# Patient Record
Sex: Male | Born: 1960 | Race: Black or African American | Hispanic: No | Smoking: Never smoker
Health system: Southern US, Community
[De-identification: ages and names within clinical notes are randomized; demographics above are authoritative.]

## PROBLEM LIST (undated history)

## (undated) ENCOUNTER — Emergency Department (HOSPITAL_COMMUNITY): Discharge status: Absent without leave | Payer: BLUE CROSS/BLUE SHIELD

## (undated) DIAGNOSIS — C61 Malignant neoplasm of prostate: Secondary | ICD-10-CM

## (undated) DIAGNOSIS — I10 Essential (primary) hypertension: Secondary | ICD-10-CM

## (undated) DIAGNOSIS — K219 Gastro-esophageal reflux disease without esophagitis: Secondary | ICD-10-CM

## (undated) DIAGNOSIS — Z8719 Personal history of other diseases of the digestive system: Secondary | ICD-10-CM

## (undated) DIAGNOSIS — C801 Malignant (primary) neoplasm, unspecified: Secondary | ICD-10-CM

## (undated) DIAGNOSIS — G4730 Sleep apnea, unspecified: Secondary | ICD-10-CM

## (undated) DIAGNOSIS — J45909 Unspecified asthma, uncomplicated: Secondary | ICD-10-CM

## (undated) DIAGNOSIS — R188 Other ascites: Secondary | ICD-10-CM

## (undated) HISTORY — PX: OTHER SURGICAL HISTORY: SHX169

## (undated) HISTORY — PX: HERNIA REPAIR: SHX51

## (undated) HISTORY — DX: Malignant neoplasm of prostate: C61

## (undated) HISTORY — PX: VASECTOMY: SHX75

## (undated) HISTORY — PX: KNEE SURGERY: SHX244

---

## 1998-04-09 ENCOUNTER — Encounter: Admission: RE | Admit: 1998-04-09 | Discharge: 1998-04-09 | Payer: Self-pay | Admitting: *Deleted

## 1999-01-23 ENCOUNTER — Emergency Department (HOSPITAL_COMMUNITY): Admission: EM | Admit: 1999-01-23 | Discharge: 1999-01-23 | Payer: Self-pay | Admitting: Emergency Medicine

## 2000-01-10 ENCOUNTER — Emergency Department (HOSPITAL_COMMUNITY): Admission: EM | Admit: 2000-01-10 | Discharge: 2000-01-10 | Payer: Self-pay | Admitting: *Deleted

## 2002-05-16 ENCOUNTER — Encounter (INDEPENDENT_AMBULATORY_CARE_PROVIDER_SITE_OTHER): Payer: Self-pay | Admitting: *Deleted

## 2002-05-16 ENCOUNTER — Ambulatory Visit (HOSPITAL_BASED_OUTPATIENT_CLINIC_OR_DEPARTMENT_OTHER): Admission: RE | Admit: 2002-05-16 | Discharge: 2002-05-16 | Payer: Self-pay | Admitting: Urology

## 2007-02-23 ENCOUNTER — Emergency Department (HOSPITAL_COMMUNITY): Admission: EM | Admit: 2007-02-23 | Discharge: 2007-02-23 | Payer: Self-pay | Admitting: Family Medicine

## 2007-09-03 ENCOUNTER — Emergency Department (HOSPITAL_COMMUNITY): Admission: EM | Admit: 2007-09-03 | Discharge: 2007-09-03 | Payer: Self-pay | Admitting: Family Medicine

## 2007-09-20 ENCOUNTER — Encounter: Admission: RE | Admit: 2007-09-20 | Discharge: 2007-09-20 | Payer: Self-pay | Admitting: Internal Medicine

## 2007-12-16 ENCOUNTER — Encounter (INDEPENDENT_AMBULATORY_CARE_PROVIDER_SITE_OTHER): Payer: Self-pay | Admitting: Urology

## 2007-12-17 ENCOUNTER — Ambulatory Visit (HOSPITAL_BASED_OUTPATIENT_CLINIC_OR_DEPARTMENT_OTHER): Admission: RE | Admit: 2007-12-17 | Discharge: 2007-12-17 | Payer: Self-pay | Admitting: Urology

## 2010-01-27 ENCOUNTER — Emergency Department (HOSPITAL_COMMUNITY): Admission: EM | Admit: 2010-01-27 | Discharge: 2010-01-27 | Payer: Self-pay | Admitting: Family Medicine

## 2010-09-28 ENCOUNTER — Emergency Department: Payer: Self-pay | Admitting: Emergency Medicine

## 2010-12-31 LAB — D-DIMER, QUANTITATIVE: D-Dimer, Quant: 0.23 ug/mL-FEU (ref 0.00–0.48)

## 2011-02-25 NOTE — Op Note (Signed)
Kenneth Wiggins, Kenneth Wiggins               ACCOUNT NO.:  [id number]   MEDICAL RECORD NO.:  [id number]          PATIENT TYPE:  AMB   LOCATION:  [facility]                         FACILITY:  [facility]   PHYSICIAN:  Hanley Ben, M.D.  DATE OF BIRTH:  January 06, 1961   DATE OF PROCEDURE:  12/17/2007  DATE OF DISCHARGE:                               OPERATIVE REPORT   PREOPERATIVE DIAGNOSIS:  Left paratesticular mass.   POSTOPERATIVE DIAGNOSIS:  Left paratesticular mass.   PROCEDURE:  Left scrotal exploration with excision of left  paratesticular mass.   ANESTHESIA:  General/LMA.   INDICATIONS FOR PROCEDURE:  \  Mr. Gerena is a 50 year old African-American male who is status post  vasectomy.  He reported to Dr. Sammie Bench clinic complaining of left scrotal  swelling that was nonpainful without any prior inciting event.  Workup  demonstrated paratesticular mass suggestive of a benign process, and  likewise, he was brought to the operating theater for further  evaluation.   PROCEDURE IN DETAIL:  The patient was brought to the operating room and  placed in a supine position.  He was correctly identified by his wrist  band.  An appropriate time out was taken.  General/LMA anesthesia was  delivered.  Once adequately anesthetized, his groin was clipped, prepped  and draped sterilely.  We began our procedure by performing  comprehensive scrotal examination under anesthesia.  The right  hemiscrotum demonstrated a normal right testicle and epididymis.  Cord  structures were palpably normal too.  There was a palpable defect in the  vas deferens with vasectomy.  Palpation of the left hemiscrotum  demonstrated a large tennis ball-sized mass that was fluctuant but also  had some firm components to it.  It was poorly identifiable on palpation  alone.  It was palpably extratesticular.  All the cord structures  appeared normal on the left.  Subsequently, we made a vertical incision  in the anterior aspect of the left  hemiscrotum sharply and then  dissected through the dartos fascia using a combination of blunt and  sharp dissection as well as electrocautery taking great care to address  all bleeding sites.  Once we reached the appropriate plane on top of the  mass, we then were able to develop this plane both sharply  and bluntly  and subsequently deliver the mass and the left testicle out of the  incision.  Thorough inspection again revealed a normal-appearing  testicle and epididymis and palpably normal cord structures.  The mass  was quite irregular, very lumpy/bumpy with firm and fluid components to  it.  It was closely adherent to the posterolateral aspect of the  testicle and epididymis.  We then made a small incision into the mass  itself and amber-colored fluid drained freely.  There was no blood noted  here.  Now with the mass decompressed, we then circumferentially  resected the entire rind of the mass taking great care to protect the  epididymis, the testicle and all cord structures.  We did send a section  for analysis to pathology.  Once the entire mass had been resected, we  then  meticulously inspected the entire operative field and addressed all  bleeding areas with electrocautery.  Once we had achieved hemostasis, we  copiously irrigated the field with sterile normal saline.  We then  placed a Penrose drain by making a small incision in the most dependent  portion of the scrotum and developing this space with hemostats and then  pulling the Penrose drain from inside to outside.  The drain was placed  behind the scrotum.  It was sutured into place with one simple  interrupted suture using 3-0 chromic.  We then reinspected the left  hemiscrotum again.  There was no obvious bleeding, and we returned the  testicle into the left hemiscrotum and closed our incision.  The closure  was done in a two-layer fashion.  Deep closure reapproximated the dartos  fascia with deep wide bites and locking  running suture using 3-0  chromic.  We then closed the scrotal skin again with wide bites of  locking running suture using 3-0 chromic.  The incision was hemostatic.  His groin was cleansed and dried and dressed with an athletic supporter  as well as a fluff dressing.  This marked the end of our procedure.  Anesthesia was reversed.  His LMA was removed, and he was taken to the  PACU in stable condition.  There were no complications.  Blood loss was  minimal.  Dr. Janice Norrie was present and participated in all aspects of the  case.     ______________________________  Gaye Alken, M.D.  Electronically Signed    DW/MEDQ  D:  12/17/2007  T:  12/17/2007  Job:  [id number]

## 2011-02-28 NOTE — Op Note (Signed)
   Kenneth Wiggins, Kenneth Wiggins NO.:  [id number]   MEDICAL RECORD NO.:  [id number]                    PATIENT TYPE:   LOCATION:                                       FACILITY:   PHYSICIAN:  Einar Crow, M.D.          DATE OF BIRTH:  [birth year]   DATE OF PROCEDURE:  DATE OF DISCHARGE:                                 OPERATIVE REPORT   LOCAL MEDICAL DOCTOR:   UROLOGIST:  Einar Crow, M.D.   PREOPERATIVE DIAGNOSIS:  Undesired fertility with difficult to palpate vas.  Body habitus 5 feet 10 inches tall, 270 pounds.  Physical exam in the office  showed that it was uncomfortable for both surgeon and patient to bring the  vas to the skin.  Elected to do procedure under brief general anesthesia.   POSTOPERATIVE DIAGNOSIS:  Undesired fertility with difficult to palpate vas.  Body habitus 5 feet 10 inches tall, 270 pounds.  Physical exam in the office  showed that it was uncomfortable for both surgeon and patient to bring the  vas to the skin.  Elected to do procedure under brief general anesthesia.   DRAINS:  None.   COMPLICATIONS:  None.   DESCRIPTION OF PROCEDURE:  The patient was prepped and draped in the dorsal  lithotomy position after institution of an adequate level of general  anesthesia (LMA).  Right vas was brought to the skin, transverse incision  made in the skin, cautery used to go through the skin and dartos.  Vas was  easily visualized, cleaned with needle-tip forceps, grasped with a ring  forcep, brought through the skin, divided proximally and distally.  Proximal  end was sewn back on itself with a 3-0 chromic stitch.  The distal end was  cauterized by placing the lumen of the needle to Bovie down the vas and  cauterizing the lumen.  Similar technique was used on the left side.  With  patient under anesthesia, I was able to bring the vas to the skin.  Identical technique was used on both sides.  Wound was covered with dry  gauze and an ice bag, and patient was returned to recovery in satisfactory  condition.                                                  Einar Crow, M.D.    [facility]/MEDQ  D:  05/16/2002  T:  05/19/2002  Job:  [id number]

## 2011-07-07 LAB — POCT HEMOGLOBIN-HEMACUE
Hemoglobin: 17.4 — ABNORMAL HIGH
Operator id: 268271

## 2011-09-22 ENCOUNTER — Ambulatory Visit: Payer: Self-pay

## 2012-08-09 DIAGNOSIS — C61 Malignant neoplasm of prostate: Secondary | ICD-10-CM

## 2012-08-09 HISTORY — PX: PROSTATE BIOPSY: SHX241

## 2012-08-09 HISTORY — DX: Malignant neoplasm of prostate: C61

## 2012-09-28 ENCOUNTER — Other Ambulatory Visit: Payer: Self-pay | Admitting: Urology

## 2012-11-15 ENCOUNTER — Encounter (HOSPITAL_COMMUNITY): Payer: Self-pay | Admitting: Pharmacy Technician

## 2012-11-22 ENCOUNTER — Encounter (HOSPITAL_COMMUNITY): Payer: Self-pay

## 2012-11-22 ENCOUNTER — Encounter (HOSPITAL_COMMUNITY)
Admission: RE | Admit: 2012-11-22 | Discharge: 2012-11-22 | Disposition: A | Discharge status: Home / Self Care | Payer: BC Managed Care – PPO | Source: Ambulatory Visit | Attending: Urology | Admitting: Urology

## 2012-11-22 HISTORY — DX: Gastro-esophageal reflux disease without esophagitis: K21.9

## 2012-11-22 HISTORY — DX: Personal history of other diseases of the digestive system: Z87.19

## 2012-11-22 HISTORY — DX: Malignant (primary) neoplasm, unspecified: C80.1

## 2012-11-22 HISTORY — DX: Unspecified asthma, uncomplicated: J45.909

## 2012-11-22 HISTORY — DX: Sleep apnea, unspecified: G47.30

## 2012-11-22 LAB — CBC
MCH: 29.8 pg (ref 26.0–34.0)
MCV: 83.7 fL (ref 78.0–100.0)
Platelets: 237 10*3/uL (ref 150–400)
RBC: 5.34 MIL/uL (ref 4.22–5.81)
WBC: 4.5 10*3/uL (ref 4.0–10.5)

## 2012-11-22 LAB — ABO/RH: ABO/RH(D): O POS

## 2012-11-22 LAB — SURGICAL PCR SCREEN: MRSA, PCR: NEGATIVE

## 2012-11-22 NOTE — Patient Instructions (Addendum)
20 Kenneth Wiggins  11/22/2012   Your procedure is scheduled on: 11/24/12  Report to [place] at 0600 AM.  Call this number if you have problems the morning of surgery [phone]: [phone]   Remember: please bring CPAP mask and tubing    Do not eat food or drink liquids After Midnight.      Do not wear jewelry, make-up or nail polish.  Do not wear lotions, powders, or perfumes. You may wear deodorant.  Do not shave 48 hours prior to surgery. Men may shave face and neck.  Do not bring valuables to the hospital.  Contacts, dentures or bridgework may not be worn into surgery.  Leave suitcase in the car. After surgery it may be brought to your room.  For patients admitted to the hospital, checkout time is 11:00 AM the day of discharge.      Please read over the following fact sheets that you were given: MRSA Information.  Paulette Blanch, RN  pre op nurse call if needed [phone]    FAILURE TO FOLLOW THESE INSTRUCTIONS MAY RESULT IN CANCELLATION OF YOUR SURGERY   Patient Signature: ___________________________________________

## 2012-11-23 NOTE — H&P (Signed)
Reason For Visit Presents this morning for robotic-assisted laparoscopic radical prostatectomy.   History of Present Illness  Kenneth Wiggins comes in today with his wife for discussion of the pros and cons of robotic prostatectomy in management of prostate cancer. His prostate cancer history is summarized in the section below. Prostate was between 20 and 25 g. The patient appears to have a favorable to intermediate risk prostate cancer. Preoperative shim score is 25/25. AUA score is zero.         Mr Hockley had a prostate biopsy on 10/28.  His PSA was 4.75 on 8/5.  It was 3.93 at time of biopsy.  It shows Gleason 6 adenocarcinoma in < 5%  to 60% of ten out of twelve cores.  He has good erections.  I explained to him that his prognosis depends on the Gleason score, the PSA and the volume of the disease.  He has favorable Gleason and PSA and he has large volume disease. As per the [facility] he has 84% chance of having organ confined disease, 19% risk f extraprostatic extension, 1% risk of seminal vesicle invasion and 1.4% risk of lymph node involvement.  10 year progression free probability after radical prostatectomy is 93%.  Probability of cancer specific survival is 99% at 15 years.   Past Medical History Problems  1. History of  Adult Sleep Apnea 780.57 2. History of  Asthma 493.90 3. History of  Heartburn 787.1  Surgical History Problems  1. History of  Surgery Of Male Genitalia Vasectomy V25.2 2. History of  Surgery Scrotum Exploration Left 3. History of  Surgery Testis  Current Meds 1. No Reported Medications  Allergies Medication  1. No Known Drug Allergies  Family History Problems  1. Family history of  Family Health Status - Father's Age 13 2. Family history of  Family Health Status - Mother's Age 46 3. Family history of  Family Health Status Number Of Children 1 son/ 1 daug 4. Family history of  Prostate Cancer V16.42  Social History Problems  1. Caffeine  Use 1/2 cup per day 2. Marital History - Currently Married 3. Never A Smoker Denied  4. History of  Alcohol Use 5. History of  Tobacco Use  Review of Systems Genitourinary, constitutional, skin, eye, otolaryngeal, hematologic/lymphatic, cardiovascular, pulmonary, endocrine, musculoskeletal, gastrointestinal, neurological and psychiatric system(s) were reviewed and pertinent findings if present are noted.  Musculoskeletal: back pain.    Vitals Vital Signs [Data Includes: Last 1 Day]  Blood Pressure: 160 / 87 Temperature: 98.8 F Heart Rate: 93  Physical Exam Constitutional: Well nourished and well developed . No acute distress.  Pulmonary: No respiratory distress and normal respiratory rhythm and effort.  Cardiovascular: Heart rate and rhythm are normal . No peripheral edema.  Abdomen: The abdomen is soft and nontender. No masses are palpated. No CVA tenderness. No hernias are palpable. No hepatosplenomegaly noted.  Genitourinary: Normal external genitalia. Prostate 1+ without nodules or induration. Extremities: Nontender no edema.   Results/Data Urine [Data Includes: Last 1 Day]   [id number] COLOR YELLOW  APPEARANCE CLEAR  SPECIFIC GRAVITY 1.020  pH 7.0  GLUCOSE NEG mg/dL BILIRUBIN NEG  KETONE NEG mg/dL BLOOD NEG  PROTEIN NEG mg/dL UROBILINOGEN 1 mg/dL NITRITE NEG  LEUKOCYTE ESTERASE NEG   Assessment Assessed  1. Adenocarcinoma Of The Prostate Gland 185  Plan Health Maintenance (V70.0)  1. UA With REFLEX  Done: [id number] 03:50PM  Discussion/Summary  The patient was counseled about the natural history of prostate cancer  and the standard treatment options that are available for prostate cancer. It was explained to him how his age and life expectancy, clinical stage, Gleason score, and PSA affect his prognosis, the decision to proceed with additional staging studies, as well as how that information influences recommended treatment strategies. We discussed the roles for  active surveillance, radiation therapy, surgical therapy, androgen deprivation, as well as ablative therapy options for the treatment of prostate cancer as appropriate to his individual cancer situation. We discussed the risks and benefits of these options with regard to their impact on cancer control and also in terms of potential adverse events, complications, and impact on quiality of life particularly related to urinary, bowel, and sexual function. The patient was encouraged to ask questions throughout the discussion today and all questions were answered to his stated satisfaction. In addition, the patient was provided with and/or directed to appropriate resources and literature for further education about prostate cancer and treatment options.   We discussed surgical therapy for prostate cancer including the different available surgical approaches. We discussed, in detail, the risks and expectations of surgery with regard to cancer control, urinary control, and erectile function as well as the expected postoperative recovery process. The risks, potential complications/adverse events of radical prostatectomy as well as alternative options were explained to the patient.   We discussed surgical therapy for prostate cancer including the different available surgical approaches. We discussed, in detail, the risks and expectations of surgery with regard to cancer control, urinary control, and erectile function as well as the expected postoperative recovery process. Additional risks of surgery including but not limited to bleeding, infection, hernia formation, nerve damage, lymphocele formation, bowel/rectal injury potentially necessitating colostomy, damage to the urinary tract resulting in urine leakage, urethral stricture, and the cardiopulmonary risks such as myocardial infarction, stroke, death, venothromboembolism, etc. were explained. The risk of open surgical conversion for robotic/laparoscopic prostatectomy  was also discussed.   45 minutes were spent in face to face consultation with patient today.

## 2012-11-24 ENCOUNTER — Ambulatory Visit (HOSPITAL_COMMUNITY): Payer: BC Managed Care – PPO | Admitting: Anesthesiology

## 2012-11-24 ENCOUNTER — Encounter (HOSPITAL_COMMUNITY): Payer: Self-pay | Admitting: *Deleted

## 2012-11-24 ENCOUNTER — Encounter (HOSPITAL_COMMUNITY): Payer: Self-pay | Admitting: Anesthesiology

## 2012-11-24 ENCOUNTER — Observation Stay (HOSPITAL_COMMUNITY)
Admission: RE | Admit: 2012-11-24 | Discharge: 2012-11-25 | Disposition: A | Discharge status: Home / Self Care | Payer: BC Managed Care – PPO | Source: Ambulatory Visit | Attending: Urology | Admitting: Urology

## 2012-11-24 ENCOUNTER — Encounter (HOSPITAL_COMMUNITY)
Admission: RE | Disposition: A | Discharge status: Home / Self Care | Payer: Self-pay | Source: Ambulatory Visit | Attending: Urology

## 2012-11-24 DIAGNOSIS — R7981 Abnormal blood-gas level: Secondary | ICD-10-CM | POA: Insufficient documentation

## 2012-11-24 DIAGNOSIS — G4730 Sleep apnea, unspecified: Secondary | ICD-10-CM | POA: Insufficient documentation

## 2012-11-24 DIAGNOSIS — J45909 Unspecified asthma, uncomplicated: Secondary | ICD-10-CM | POA: Insufficient documentation

## 2012-11-24 DIAGNOSIS — I4949 Other premature depolarization: Secondary | ICD-10-CM | POA: Insufficient documentation

## 2012-11-24 DIAGNOSIS — R12 Heartburn: Secondary | ICD-10-CM | POA: Insufficient documentation

## 2012-11-24 DIAGNOSIS — C61 Malignant neoplasm of prostate: Principal | ICD-10-CM | POA: Insufficient documentation

## 2012-11-24 DIAGNOSIS — Z5309 Procedure and treatment not carried out because of other contraindication: Secondary | ICD-10-CM | POA: Insufficient documentation

## 2012-11-24 HISTORY — PX: ROBOT ASSISTED LAPAROSCOPIC RADICAL PROSTATECTOMY: SHX5141

## 2012-11-24 SURGERY — ROBOTIC ASSISTED LAPAROSCOPIC RADICAL PROSTATECTOMY
Anesthesia: General | Wound class: Clean Contaminated

## 2012-11-24 MED ORDER — HEPARIN SODIUM (PORCINE) 1000 UNIT/ML IJ SOLN
INTRAMUSCULAR | Status: AC
  Filled 2012-11-24: qty 1

## 2012-11-24 MED ORDER — PROMETHAZINE HCL 25 MG/ML IJ SOLN: 6.2500 mg | INTRAMUSCULAR | Status: DC | PRN

## 2012-11-24 MED ORDER — INDIGOTINDISULFONATE SODIUM 8 MG/ML IJ SOLN
INTRAMUSCULAR | Status: AC
  Filled 2012-11-24: qty 10

## 2012-11-24 MED ORDER — HYDROMORPHONE HCL PF 1 MG/ML IJ SOLN: 0.2500 mg | INTRAMUSCULAR | Status: DC | PRN

## 2012-11-24 MED ORDER — LACTATED RINGERS IV SOLN: INTRAVENOUS | Status: DC

## 2012-11-24 MED ORDER — NEOSTIGMINE METHYLSULFATE 1 MG/ML IJ SOLN
INTRAMUSCULAR | Status: DC | PRN
  Administered 2012-11-24: 4 mg via INTRAVENOUS

## 2012-11-24 MED ORDER — ACETAMINOPHEN 10 MG/ML IV SOLN
INTRAVENOUS | Status: AC
  Filled 2012-11-24: qty 100

## 2012-11-24 MED ORDER — LACTATED RINGERS IV SOLN
INTRAVENOUS | Status: DC | PRN
  Administered 2012-11-24 (×2): via INTRAVENOUS

## 2012-11-24 MED ORDER — LACTATED RINGERS IV SOLN
INTRAVENOUS | Status: DC | PRN
  Administered 2012-11-24: 10:00:00

## 2012-11-24 MED ORDER — MORPHINE SULFATE 2 MG/ML IJ SOLN: 2.0000 mg | INTRAMUSCULAR | Status: DC | PRN

## 2012-11-24 MED ORDER — LIDOCAINE HCL (CARDIAC) 20 MG/ML IV SOLN
INTRAVENOUS | Status: DC | PRN
  Administered 2012-11-24: 50 mg via INTRAVENOUS

## 2012-11-24 MED ORDER — HYDROCODONE-ACETAMINOPHEN 5-325 MG PO TABS: 1.0000 | ORAL_TABLET | Freq: Four times a day (QID) | ORAL | Status: DC | PRN

## 2012-11-24 MED ORDER — SUCCINYLCHOLINE CHLORIDE 20 MG/ML IJ SOLN
INTRAMUSCULAR | Status: DC | PRN
  Administered 2012-11-24: 200 mg via INTRAVENOUS

## 2012-11-24 MED ORDER — ACETAMINOPHEN 10 MG/ML IV SOLN
1000.0000 mg | Freq: Four times a day (QID) | INTRAVENOUS | Status: DC
  Administered 2012-11-24 – 2012-11-25 (×3): 1000 mg via INTRAVENOUS
  Filled 2012-11-24 (×5): qty 100

## 2012-11-24 MED ORDER — SODIUM CHLORIDE 0.9 % IR SOLN
Status: DC | PRN
  Administered 2012-11-24: 1000 mL

## 2012-11-24 MED ORDER — CEFAZOLIN SODIUM 1-5 GM-% IV SOLN
INTRAVENOUS | Status: AC
  Filled 2012-11-24: qty 50

## 2012-11-24 MED ORDER — ROCURONIUM BROMIDE 100 MG/10ML IV SOLN
INTRAVENOUS | Status: DC | PRN
  Administered 2012-11-24: 20 mg via INTRAVENOUS
  Administered 2012-11-24: 30 mg via INTRAVENOUS
  Administered 2012-11-24: 50 mg via INTRAVENOUS

## 2012-11-24 MED ORDER — FENTANYL CITRATE 0.05 MG/ML IJ SOLN
INTRAMUSCULAR | Status: DC | PRN
  Administered 2012-11-24 (×3): 50 ug via INTRAVENOUS
  Administered 2012-11-24: 100 ug via INTRAVENOUS
  Administered 2012-11-24 (×4): 50 ug via INTRAVENOUS

## 2012-11-24 MED ORDER — GLYCOPYRROLATE 0.2 MG/ML IJ SOLN
INTRAMUSCULAR | Status: DC | PRN
  Administered 2012-11-24: .7 mg via INTRAVENOUS

## 2012-11-24 MED ORDER — KCL IN DEXTROSE-NACL 10-5-0.45 MEQ/L-%-% IV SOLN
INTRAVENOUS | Status: DC
  Administered 2012-11-24: 16:00:00 via INTRAVENOUS
  Filled 2012-11-24 (×4): qty 1000

## 2012-11-24 MED ORDER — HYDROCODONE-ACETAMINOPHEN 5-325 MG PO TABS: 1.0000 | ORAL_TABLET | ORAL | Status: DC | PRN

## 2012-11-24 MED ORDER — BUPIVACAINE-EPINEPHRINE 0.25% -1:200000 IJ SOLN
INTRAMUSCULAR | Status: DC | PRN
  Administered 2012-11-24: 27 mL

## 2012-11-24 MED ORDER — HEMOSTATIC AGENTS (NO CHARGE) OPTIME
TOPICAL | Status: DC | PRN
  Administered 2012-11-24: 1 via TOPICAL

## 2012-11-24 MED ORDER — STERILE WATER FOR IRRIGATION IR SOLN
Status: DC | PRN
  Administered 2012-11-24: 3000 mL

## 2012-11-24 MED ORDER — ACETAMINOPHEN 10 MG/ML IV SOLN
INTRAVENOUS | Status: DC | PRN
  Administered 2012-11-24: 1000 mg via INTRAVENOUS

## 2012-11-24 MED ORDER — MIDAZOLAM HCL 5 MG/5ML IJ SOLN
INTRAMUSCULAR | Status: DC | PRN
  Administered 2012-11-24: 2 mg via INTRAVENOUS

## 2012-11-24 MED ORDER — BUPIVACAINE-EPINEPHRINE PF 0.25-1:200000 % IJ SOLN
INTRAMUSCULAR | Status: AC
  Filled 2012-11-24: qty 30

## 2012-11-24 MED ORDER — CEFAZOLIN SODIUM-DEXTROSE 2-3 GM-% IV SOLR
INTRAVENOUS | Status: AC
  Filled 2012-11-24: qty 50

## 2012-11-24 MED ORDER — CEFAZOLIN SODIUM-DEXTROSE 2-3 GM-% IV SOLR
2.0000 g | INTRAVENOUS | Status: AC
  Administered 2012-11-24: 3 g via INTRAVENOUS

## 2012-11-24 SURGICAL SUPPLY — 55 items
AGENT HMST MTR 8 SURGIFLO (HEMOSTASIS) ×1
APL ESCP 34 STRL LF DISP (HEMOSTASIS) ×1
APPLICATOR SURGIFLO ENDO (HEMOSTASIS) ×1 IMPLANT
CANISTER SUCTION 2500CC (MISCELLANEOUS) ×2 IMPLANT
CATH FOLEY 2WAY SLVR  5CC 20FR (CATHETERS) ×1
CATH FOLEY 2WAY SLVR 18FR 30CC (CATHETERS) ×2 IMPLANT
CATH FOLEY 2WAY SLVR 5CC 20FR (CATHETERS) ×1 IMPLANT
CATH ROBINSON RED A/P 16FR (CATHETERS) ×2 IMPLANT
CATH ROBINSON RED A/P 8FR (CATHETERS) ×2 IMPLANT
CATH TIEMANN FOLEY 18FR 5CC (CATHETERS) ×2 IMPLANT
CHLORAPREP W/TINT 26ML (MISCELLANEOUS) ×2 IMPLANT
CLIP LIGATING HEM O LOK PURPLE (MISCELLANEOUS) ×4 IMPLANT
CLOTH BEACON ORANGE TIMEOUT ST (SAFETY) ×2 IMPLANT
CORD HIGH FREQUENCY UNIPOLAR (ELECTROSURGICAL) ×2 IMPLANT
COVER SURGICAL LIGHT HANDLE (MISCELLANEOUS) ×2 IMPLANT
COVER TIP SHEARS 8 DVNC (MISCELLANEOUS) ×1 IMPLANT
COVER TIP SHEARS 8MM DA VINCI (MISCELLANEOUS) ×1
CUTTER ECHEON FLEX ENDO 45 340 (ENDOMECHANICALS) ×2 IMPLANT
DECANTER SPIKE VIAL GLASS SM (MISCELLANEOUS) ×1 IMPLANT
DRAPE SURG IRRIG POUCH 19X23 (DRAPES) ×2 IMPLANT
DRAPE UTILITY 15X26 (DRAPE) ×2 IMPLANT
DRSG TEGADERM 2-3/8X2-3/4 SM (GAUZE/BANDAGES/DRESSINGS) ×9 IMPLANT
DRSG TEGADERM 4X4.75 (GAUZE/BANDAGES/DRESSINGS) ×3 IMPLANT
DRSG TEGADERM 6X8 (GAUZE/BANDAGES/DRESSINGS) ×5 IMPLANT
ELECT REM PT RETURN 9FT ADLT (ELECTROSURGICAL) ×2
ELECTRODE REM PT RTRN 9FT ADLT (ELECTROSURGICAL) ×1 IMPLANT
GAUZE SPONGE 2X2 8PLY STRL LF (GAUZE/BANDAGES/DRESSINGS) ×1 IMPLANT
GLOVE BIO SURGEON STRL SZ 6.5 (GLOVE) ×4 IMPLANT
GLOVE BIOGEL M STRL SZ7.5 (GLOVE) ×2 IMPLANT
GOWN PREVENTION PLUS XLARGE (GOWN DISPOSABLE) ×2 IMPLANT
GOWN STRL NON-REIN LRG LVL3 (GOWN DISPOSABLE) ×2 IMPLANT
GOWN STRL REIN XL XLG (GOWN DISPOSABLE) ×4 IMPLANT
HEMOSTAT SURGICEL 4X8 (HEMOSTASIS) ×1 IMPLANT
HOLDER FOLEY CATH W/STRAP (MISCELLANEOUS) ×2 IMPLANT
IV LACTATED RINGERS 1000ML (IV SOLUTION) ×1 IMPLANT
KIT ACCESSORY DA VINCI DISP (KITS) ×1
KIT ACCESSORY DVNC DISP (KITS) ×1 IMPLANT
NDL SAFETY ECLIPSE 18X1.5 (NEEDLE) ×1 IMPLANT
NEEDLE HYPO 18GX1.5 SHARP (NEEDLE) ×2
PACK ROBOT UROLOGY CUSTOM (CUSTOM PROCEDURE TRAY) ×2 IMPLANT
RELOAD GREEN ECHELON 45 (STAPLE) ×2 IMPLANT
SEALER TISSUE G2 CVD JAW 45CM (ENDOMECHANICALS) ×1 IMPLANT
SET TUBE IRRIG SUCTION NO TIP (IRRIGATION / IRRIGATOR) ×2 IMPLANT
SOLUTION ELECTROLUBE (MISCELLANEOUS) ×2 IMPLANT
SPOGE SURGIFLO 8M (HEMOSTASIS) ×1
SPONGE GAUZE 2X2 STER 10/PKG (GAUZE/BANDAGES/DRESSINGS)
SPONGE SURGIFLO 8M (HEMOSTASIS) IMPLANT
SUT ETHILON 3 0 PS 1 (SUTURE) ×1 IMPLANT
SUT VIC AB 2-0 SH 27 (SUTURE) ×2
SUT VIC AB 2-0 SH 27X BRD (SUTURE) ×1 IMPLANT
SUT VICRYL 0 UR6 27IN ABS (SUTURE) ×2 IMPLANT
SYR 27GX1/2 1ML LL SAFETY (SYRINGE) ×2 IMPLANT
TOWEL OR NON WOVEN STRL DISP B (DISPOSABLE) ×2 IMPLANT
TROCAR 12M 150ML BLUNT (TROCAR) ×1 IMPLANT
WATER STERILE IRR 1500ML POUR (IV SOLUTION) ×2 IMPLANT

## 2012-11-24 NOTE — Transfer of Care (Signed)
Immediate Anesthesia Transfer of Care Note  Patient: Kenneth Wiggins  Procedure(s) Performed: Procedure(s) (LRB): Attempted ROBOTIC ASSISTED LAPAROSCOPIC RADICAL PROSTATECTOMY, Abandoned (N/A)  Patient Location: PACU  Anesthesia Type: General  Level of Consciousness: sedated, patient cooperative and responds to stimulaton  Airway & Oxygen Therapy: Patient Spontanous Breathing and Patient connected to face mask oxgen  Post-op Assessment: Report given to PACU RN and Post -op Vital signs reviewed and stable  Post vital signs: Reviewed and stable  Complications: No apparent anesthesia complications

## 2012-11-24 NOTE — Addendum Note (Signed)
Addendum created 11/24/12 1315 by Anne Fu, CRNA   Modules edited: Anesthesia Flowsheet

## 2012-11-24 NOTE — Op Note (Signed)
Preoperative diagnosis: Clinical stage T1c Adenocarcinoma prostate  Postoperative diagnosis: Same  Procedure: Attempted Robotic-assisted laparoscopic radical retropubic prostatectomy ( Surgery abandoned Secondary to anesthetic complications as described below). Surgeon: Bernestine Amass, MD  Asst.: Leta Baptist, PA Anesthesia: Gen. Endotracheal  Indications: Patient was diagnosed with clinical stage TIc Adenocarcinoma the prostate. He underwent extensive consultation with regard to treatment options. The patient decided on a surgical approach. He appeared to understand the distinct advantages as well as the disadvantages of this procedure. The patient has performed a mechanical bowel prep. He has had placement of PAS compression boots and has received perioperative antibiotics. The patient's preoperative PSA was 3.9. Ultrasound revealed a 25 g prostate.   Technique and findings:The patient was brought to the operating room and had successful induction of general endotracheal anesthesia.the patient was placed in a low lithotomy position with careful padding of all extremities. He was secured to the operative table and placed in the steep Trendelenburg position. He was prepped and draped in usual manner. A Foley catheter was placed sterilely on the field. Camera port site was chosen 18 cm above the pubic symphysis just to the left of the umbilicus. A standard open Hassan technique was utilized. A 12 mm trocar was placed without difficulty. At that point anesthesia service is did inform us that the patient's tidal volume was borderline and his airway pressures were higher than they would like. They felt that we could safely continue at that point but that his situation would have to be monitored. We did take them out of a little bit of the Trendelenburg positioning.The camera was then inserted and no abnormalities were noted within the pelvis. The trochars were placed with direct visual guidance. This  included 3 14mm robotic trochars and a 12 mm and 5 mm assist ports. Once all the ports were placed the robot was docked. The bladder was filled and the space of Retzius was developed with electrocautery dissection as well as blunt dissection. Superficial fat off the endopelvic fascia and bladder neck was removed with electrocautery scissors. The endopelvic fascia was then incised bilaterally from base to apex. At that point I was going to isolate the dorsal venous complex. I was informed by the anesthesia team that his respiratory status had further declined. Tidal volumes at that point were not acceptable and peak airway pressures were increasing. In addition there was some evidence of ventricular ectopy with increased PVCs. We discussed the situation and felt that we could not safely continue with a laparoscopic prostatectomy. At that point I went to speak to Kenneth Wiggins wife. I explained the situation to her. I told them it was not safe to continue with a laparoscopic prostatectomy and that our choice was to convert to an open surgery or to consider alternative therapies.  After much discussion with her she requested that we abandon the prostatectomy and not proceed with an open radical prostatectomy. I did tell her that that surgery would be quite difficult given his body habitus as well as narrow pelvis. We will discuss additional options for treatment with Kenneth Wiggins at later date.   The trochars were taken out with direct visual guidance without evidence of any bleeding. The camera port incision was closed with a running Vicryl suture. All port sites were infiltrated with Marcaine and then closed with surgical clips. The patient was then taken to recovery room having had no obvious complications or problems. Sponge and needle counts were correct.

## 2012-11-24 NOTE — Anesthesia Postprocedure Evaluation (Signed)
  Anesthesia Post-op Note  Patient: Kenneth Wiggins  Procedure(s) Performed: Procedure(s) (LRB): Attempted ROBOTIC ASSISTED LAPAROSCOPIC RADICAL PROSTATECTOMY, Abandoned (N/A)  Patient Location: PACU  Anesthesia Type: General  Level of Consciousness: awake and alert   Airway and Oxygen Therapy: Patient Spontanous Breathing  Post-op Pain: mild  Post-op Assessment: Post-op Vital signs reviewed, Patient's Cardiovascular Status Stable, Respiratory Function Stable, Patent Airway and No signs of Nausea or vomiting  Last Vitals:  Filed Vitals:   11/24/12 1232  BP: 170/84  Pulse: 77  Temp: 36.4 C  Resp: 16    Post-op Vital Signs: stable   Complications: Unable to complete robotic prostatectomy due to high airway pressures, low tidal volumes, high end-tidal CO2 levels, and some multifocal PVCs. I discussed this with Kenneth Wiggins in the PACU. In the pacu, he feels fine, breathing is fine.

## 2012-11-24 NOTE — Progress Notes (Signed)
Pt setup on cpap & overnight pulse ox at this time. Pt unaware of setting, so after trial & error arrived at 13 cmH2O. Pt says this feels good & instructed to call if he has any problems with it. Pt placed on full face mask because he wears this at home.  Kathie Dike  RRT

## 2012-11-24 NOTE — Interval H&P Note (Signed)
History and Physical Interval Note:  11/24/2012 8:25 AM  Kenneth Wiggins  has presented today for surgery, with the diagnosis of PROSTATE CANCER  The various methods of treatment have been discussed with the patient and family. After consideration of risks, benefits and other options for treatment, the patient has consented to  Procedure(s): ROBOTIC [place] (N/A) as a surgical intervention .  The patient's history has been reviewed, patient examined, no change in status, stable for surgery.  I have reviewed the patient's chart and labs.  Questions were answered to the patient's satisfaction.     [name]

## 2012-11-24 NOTE — Progress Notes (Signed)
Post-op note  Subjective: The patient is doing well.  No complaints.  Objective: Vital signs in last 24 hours: Temp:  [97.4 F (36.3 C)-98.5 F (36.9 C)] 97.6 F (36.4 C) (02/12 1232) Pulse Rate:  [77-101] 77 (02/12 1232) Resp:  [9-18] 16 (02/12 1232) BP: (106-170)/(67-93) 170/84 mmHg (02/12 1232) SpO2:  [100 %] 100 % (02/12 1232) Weight:  [150.367 kg (331 lb 8 oz)] 150.367 kg (331 lb 8 oz) (02/12 1232)  Intake/Output from previous day:   Intake/Output this shift: Total I/O In: 1200 [I.V.:1200] Out: 200 [Urine:200]  Physical Exam:  General: Alert and oriented. Abdomen: Soft, Nondistended. Incisions: Clean and dry.  Lab Results:  Recent Labs  11/22/12 0935  HGB 15.9  HCT 44.7    Assessment/Plan: POD#0   1) Continue to monitor  2.) D/c foley  3.) Amb, IS, pain control, DVT prophy, plan for d/c home tomorrow   LOS: 0 days   YARBROUGH,Azeez Dunker G. 11/24/2012, 3:59 PM

## 2012-11-24 NOTE — Anesthesia Preprocedure Evaluation (Addendum)
Anesthesia Evaluation  Patient identified by MRN, date of birth, ID band Patient awake    Reviewed: Allergy & Precautions, H&P , NPO status , Patient's Chart, lab work & pertinent test results  Airway Mallampati: II TM Distance: >3 FB Neck ROM: Full    Dental no notable dental hx.    Pulmonary asthma , sleep apnea ,  breath sounds clear to auscultation  Pulmonary exam normal       Cardiovascular negative cardio ROS  Rhythm:Regular Rate:Normal     Neuro/Psych negative neurological ROS  negative psych ROS   GI/Hepatic Neg liver ROS, hiatal hernia, GERD-  ,  Endo/Other  Morbid obesity  Renal/GU negative Renal ROS  negative genitourinary   Musculoskeletal negative musculoskeletal ROS (+)   Abdominal (+) + obese,   Peds negative pediatric ROS (+)  Hematology negative hematology ROS (+)   Anesthesia Other Findings   Reproductive/Obstetrics negative OB ROS                           Anesthesia Physical Anesthesia Plan  ASA: III  Anesthesia Plan: General   Post-op Pain Management:    Induction: Intravenous  Airway Management Planned: Oral ETT  Additional Equipment:   Intra-op Plan:   Post-operative Plan: Extubation in OR  Informed Consent: I have reviewed the patients History and Physical, chart, labs and discussed the procedure including the risks, benefits and alternatives for the proposed anesthesia with the patient or authorized representative who has indicated his/her understanding and acceptance.   Dental advisory given  Plan Discussed with: CRNA  Anesthesia Plan Comments: (Discussed with patient that robotic surgery may not be possible because of his size. Questions answered.)       Anesthesia Quick Evaluation

## 2012-11-25 LAB — HEMOGLOBIN AND HEMATOCRIT, BLOOD
HCT: 42.1 % (ref 39.0–52.0)
Hemoglobin: 14.8 g/dL (ref 13.0–17.0)

## 2012-11-25 LAB — BASIC METABOLIC PANEL
BUN: 6 mg/dL (ref 6–23)
CO2: 27 mEq/L (ref 19–32)
Calcium: 8.4 mg/dL (ref 8.4–10.5)
Chloride: 101 mEq/L (ref 96–112)
Creatinine, Ser: 1.18 mg/dL (ref 0.50–1.35)
GFR calc Af Amer: 81 mL/min — ABNORMAL LOW (ref >90)

## 2012-11-25 NOTE — Discharge Summary (Signed)
Physician Discharge Summary  Patient ID: Kenneth Wiggins MRN: [id number] DOB/AGE: [id number] y.o.  Admit date: 11/24/2012 Discharge date: 11/25/2012  Admission Diagnoses: Prostate cancer  Discharge Diagnoses: Same  Active Problems:   * No active hospital problems. *   Discharged Condition: good  Hospital Course: Patient presented for a robotic prostatectomy. His surgical procedure had to be aborted due to anesthetic complications. The patient had very low tidal volumes with high peak airway pressures and was felt that it was not safe to proceed with a laparoscopic surgery. The patient's wife decided not to proceed with open prostatectomy and therefore the surgical procedure was abandoned with plans to rediscuss treatment options with the patient at a later date. The patient was capped overnight to monitor his situation and he had no complications or problems. He has had no concerns.  Consults: None  Significant Diagnostic Studies:none  Treatments: surgery: As above  Discharge Exam: Blood pressure 159/94, pulse 83, temperature 98.2 F (36.8 C), temperature source Oral, resp. rate 16, height 5\' 11"  (1.803 m), weight 150.367 kg (331 lb 8 oz), SpO2 97.00%. Multiple well-nourished male in no acute distress. Respiratory effort is normal. Heart is regular rate and rhythm. Abdomen is obese but soft. Incisions without active bleeding. Extremities show no edema or tenderness.  Disposition: Final discharge disposition not confirmed     Medication List    TAKE these medications       HYDROcodone-acetaminophen 5-325 MG per tablet  Commonly known as:  NORCO  Take 1-2 tablets by mouth every 6 (six) hours as needed for pain.           Follow-up Information   Follow up with Bernestine Amass, MD On 12/02/2012. (at 9:15)    Contact information:   [address] [place] [address] [phone]       Signed: Bernestine Amass 11/25/2012, 10:39 AM

## 2012-11-25 NOTE — Progress Notes (Signed)
RT retrieved overnight pulse oximetry from PT room. Data was downloaded and a copy of results were sent to 4E. During the PO study PT was on 13cm H20.

## 2012-11-25 NOTE — Care Management Note (Signed)
    Page 1 of 1   11/25/2012     1:28:41 PM   CARE MANAGEMENT NOTE 11/25/2012  Patient:  Kenneth Wiggins, Kenneth Wiggins   Account Number:  [id number]  Date Initiated:  11/25/2012  Documentation initiated by:  Dessa Phi  Subjective/Objective Assessment:   ADMITTED W/PROSTATE CA.     Action/Plan:   FROM HOME   Anticipated DC Date:  11/25/2012   Anticipated DC Plan:  [place]  CM consult      Choice offered to / List presented to:             Status of service:  Completed, signed off Medicare Important Message given?   (If response is "NO", the following Medicare IM given date fields will be blank) Date Medicare IM given:   Date Additional Medicare IM given:    Discharge Disposition:  HOME/SELF CARE  Per UR Regulation:  Reviewed for med. necessity/level of care/duration of stay  If discussed at [facility] of Stay Meetings, dates discussed:    Comments:

## 2012-11-26 ENCOUNTER — Encounter (HOSPITAL_COMMUNITY): Payer: Self-pay | Admitting: Urology

## 2012-12-03 ENCOUNTER — Encounter: Payer: Self-pay | Admitting: Oncology

## 2012-12-06 ENCOUNTER — Encounter: Payer: Self-pay | Admitting: Radiation Oncology

## 2012-12-06 DIAGNOSIS — C61 Malignant neoplasm of prostate: Secondary | ICD-10-CM | POA: Insufficient documentation

## 2012-12-07 ENCOUNTER — Ambulatory Visit
Admission: RE | Admit: 2012-12-07 | Discharge: 2012-12-07 | Disposition: A | Discharge status: Home / Self Care | Payer: BC Managed Care – PPO | Source: Ambulatory Visit | Attending: Radiation Oncology | Admitting: Radiation Oncology

## 2012-12-07 ENCOUNTER — Encounter: Payer: Self-pay | Admitting: Radiation Oncology

## 2012-12-07 VITALS — BP 144/95 | HR 86 | Temp 98.7°F | Resp 20 | Wt 331.6 lb

## 2012-12-07 DIAGNOSIS — C61 Malignant neoplasm of prostate: Secondary | ICD-10-CM | POA: Insufficient documentation

## 2012-12-07 DIAGNOSIS — Z510 Encounter for antineoplastic radiation therapy: Secondary | ICD-10-CM | POA: Insufficient documentation

## 2012-12-07 DIAGNOSIS — K219 Gastro-esophageal reflux disease without esophagitis: Secondary | ICD-10-CM | POA: Insufficient documentation

## 2012-12-07 DIAGNOSIS — Z8042 Family history of malignant neoplasm of prostate: Secondary | ICD-10-CM | POA: Insufficient documentation

## 2012-12-07 DIAGNOSIS — G4730 Sleep apnea, unspecified: Secondary | ICD-10-CM | POA: Insufficient documentation

## 2012-12-07 NOTE — Progress Notes (Signed)
Please see the Nurse Progress Note in the MD Initial Consult Encounter for this patient. 

## 2012-12-07 NOTE — Progress Notes (Signed)
Pt is married, 1 daughter, 1 son. 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

## 2012-12-07 NOTE — Addendum Note (Signed)
Encounter addended by: Andria Rhein, RN on: 12/07/2012 10:56 AM<BR>     Documentation filed: Charges VN

## 2012-12-07 NOTE — Progress Notes (Signed)
   Complex simulation/treatment planning note: The patient underwent a CT arch study today. His pelvis was scanned. I contoured his prostate. The prostate volume was projected over the bony arch. His arch is open. His gland volume is approximately 20.3 ccs.  I'm prescribing 14,500 cGy utilizing I-125 seeds. He is to be implanted with [employer] system.

## 2012-12-07 NOTE — Addendum Note (Signed)
Encounter addended by: Jacqulyn Liner, RN on: 12/07/2012  1:16 PM<BR>     Documentation filed: Charges VN

## 2012-12-07 NOTE — Progress Notes (Signed)
[facility] NEW PATIENT EVALUATION  Name: DCARLOS RIBEIRO MRN: [id number]  Date:   12/07/2012           DOB: 08/07/1961  Status: outpatient   CC: Tommy Medal, MD  Bernestine Amass, MD    REFERRING PHYSICIAN: Bernestine Amass, MD   DIAGNOSIS:  Stage TI C. favorable risk adenocarcinoma prostate  HISTORY OF PRESENT ILLNESS:  SELESTINO GRAINER is a 52 y.o. male who is seen today for the courtesy Dr. Rana Snare for discussion of possible radiation therapy in the management of his stage TI C. favorable risk adenocarcinoma prostate. He was noted by his primary care physician, Dr. Minna Antis, to have a rising PSA from 3.25 in January of 2013 to 4.75 in August of 2013. He was given a course of antibiotics and his PSA fell to 3.93 on 07/05/2012. He was seen by Dr. Risa Grill performed ultrasound-guided biopsies on 08/09/2012. He was found to have 10 of 12 cores containing Gleason 6 (3+3) adenocarcinoma from the base to the apex with involvement of less than 5% of one core up to 60% of one core. His gland volume was approximately 20-25 cc. He is doing well from a GU and GI standpoint. His I PSS score 0. He does have occasional hemorrhoidal bleeding. He is potent. He was taken to the operating room for an attempted robotic prostatectomy on February 12. Surgery was abandoned secondary to anesthetic complications related to poor ventilation. He is doing well postoperatively and is interested in possible seed implantation.  PREVIOUS RADIATION THERAPY: No   PAST MEDICAL HISTORY:  has a past medical history of Cancer; GERD (gastroesophageal reflux disease); H/O hiatal hernia (15 years ago); Sleep apnea; Asthma (as child); and Prostate cancer (08/09/12).     PAST SURGICAL HISTORY:  Past Surgical History  Procedure Laterality Date  . Testical growth removed    . Hernia repair  as child  . Knee surgery Left as child    tore a ligament   . Robot assisted laparoscopic radical  prostatectomy N/A 11/24/2012    Procedure: Attempted ROBOTIC ASSISTED LAPAROSCOPIC RADICAL PROSTATECTOMY, Abandoned;  Surgeon: Bernestine Amass, MD;  Location: [facility];  Service: Urology;  Laterality: N/A;  . Vasectomy    . Scrotum explotion Left   . Prostate biopsy  08/09/12    adenocarcinoma     FAMILY HISTORY: family history includes Cancer in his daughter, maternal aunt, and paternal uncle and Prostate cancer in an unspecified family member. His father died in [place] at age 1. His mother is 86 and is alive and well.  2 paternal uncles were diagnosed with prostate cancer in their 66s.   SOCIAL HISTORY:  reports that he has never smoked. He has never used smokeless tobacco. He reports that he does not drink alcohol or use illicit drugs. Married, 2 children. 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   ALLERGIES: Review of patient's allergies indicates no known allergies.   MEDICATIONS:  No current outpatient prescriptions on file.   No current facility-administered medications for this encounter.     REVIEW OF SYSTEMS:  Pertinent items are noted in HPI.    PHYSICAL EXAM:  weight is 331 lb 9.6 oz (150.413 kg). His oral temperature is 98.7 F (37.1 C). His blood pressure is 144/95 and his pulse is 86. His respiration is 20.   Alert and oriented 52 year old African American male appearing his  stated age. Head and neck examination: Grossly unremarkable. Nodes: Without palpable cervical or supraclavicular lymphadenopathy. Chest: Lungs clear. Back: Without spinal or CVA tenderness. Heart: Regular rate rhythm. Abdomen: There are granulating wounds on his anterior abdomen. Genitalia: Unremarkable to inspection. Rectal: There are external hemorrhoids. I can palpate the inferior half of the prostate which is without nodularity or induration. There is blood on the examination glove. Extremities: Without edema. Neurologic examination: Grossly  nonfocal.   LABORATORY DATA:  Lab Results  Component Value Date   WBC 4.5 11/22/2012   HGB 14.8 11/25/2012   HCT 42.1 11/25/2012   MCV 83.7 11/22/2012   PLT 237 11/22/2012   Lab Results  Component Value Date   NA 135 11/25/2012   K 3.6 11/25/2012   CL 101 11/25/2012   CO2 27 11/25/2012   No results found for this basename: ALT, AST, GGT, ALKPHOS, BILITOT   PSA 3.93 from 07/05/2012.   IMPRESSION: Stage TI C. favorable risk adenocarcinoma prostate. I explained to Mr. Goda that his prognosis is related to his stage, Gleason score, and PSA level. All are favorable. Other prognostic factors include PSA doubling time and disease volume. He does have significant disease volume, but I feel that his prognosis is still quite favorable. We discussed open surgery, cryotherapy, and radiation therapy. I do not feel that he is a candidate for cryotherapy particular view of his age and desire to remain potent. Radiation therapy options include seed implantation alone or 8 weeks of external beam/IMRT. Provided his gland size is at least 20 cc, I feel that he would be a reasonable candidate for seed implantation. After lengthy discussion he is most interested in seed implantation. I discussed the potential acute and late toxicities in great detail. We'll proceed with a CT arch study today and get him scheduled for seed implantation with Dr. Risa Grill. Consent is signed today.   PLAN: As discussed above.   I spent 40 minutes minutes face to face with the patient and more than 50% of that time was spent in counseling and/or coordination of care.

## 2012-12-08 ENCOUNTER — Telehealth: Payer: Self-pay | Admitting: *Deleted

## 2012-12-08 ENCOUNTER — Other Ambulatory Visit: Payer: Self-pay | Admitting: Urology

## 2012-12-08 NOTE — Telephone Encounter (Signed)
CALLED PATIENT TO INFORM OF IMPLANT DATE, SPOKE WITH PATIENT AND HE IS AWARE OF THIS IMPLANT

## 2012-12-15 ENCOUNTER — Ambulatory Visit (HOSPITAL_BASED_OUTPATIENT_CLINIC_OR_DEPARTMENT_OTHER)
Admission: RE | Admit: 2012-12-15 | Discharge: 2012-12-15 | Disposition: A | Discharge status: Home / Self Care | Payer: BC Managed Care – PPO | Source: Ambulatory Visit | Attending: Urology | Admitting: Urology

## 2012-12-15 ENCOUNTER — Other Ambulatory Visit: Payer: Self-pay

## 2012-12-15 ENCOUNTER — Encounter (HOSPITAL_BASED_OUTPATIENT_CLINIC_OR_DEPARTMENT_OTHER)
Admission: RE | Admit: 2012-12-15 | Discharge: 2012-12-15 | Disposition: A | Discharge status: Home / Self Care | Payer: BC Managed Care – PPO | Source: Ambulatory Visit | Attending: Urology | Admitting: Urology

## 2012-12-15 ENCOUNTER — Other Ambulatory Visit: Payer: Self-pay | Admitting: Urology

## 2012-12-15 DIAGNOSIS — I440 Atrioventricular block, first degree: Secondary | ICD-10-CM | POA: Insufficient documentation

## 2012-12-15 DIAGNOSIS — Z01818 Encounter for other preprocedural examination: Secondary | ICD-10-CM

## 2012-12-15 DIAGNOSIS — I4510 Unspecified right bundle-branch block: Secondary | ICD-10-CM | POA: Insufficient documentation

## 2013-01-03 ENCOUNTER — Encounter (HOSPITAL_COMMUNITY): Payer: Self-pay | Admitting: Pharmacy Technician

## 2013-01-07 ENCOUNTER — Telehealth: Payer: Self-pay | Admitting: *Deleted

## 2013-01-07 NOTE — Telephone Encounter (Signed)
Called patient to remind of test for Monday March 31, lvm for a return call

## 2013-01-10 ENCOUNTER — Encounter (HOSPITAL_COMMUNITY)
Admission: RE | Admit: 2013-01-10 | Discharge: 2013-01-10 | Disposition: A | Discharge status: Home / Self Care | Payer: BC Managed Care – PPO | Source: Ambulatory Visit | Attending: Urology | Admitting: Urology

## 2013-01-10 ENCOUNTER — Encounter (HOSPITAL_COMMUNITY): Payer: Self-pay

## 2013-01-10 LAB — CBC
MCV: 84 fL (ref 78.0–100.0)
Platelets: 260 10*3/uL (ref 150–400)
RBC: 5.2 MIL/uL (ref 4.22–5.81)
RDW: 13.6 % (ref 11.5–15.5)
WBC: 4.9 10*3/uL (ref 4.0–10.5)

## 2013-01-10 LAB — COMPREHENSIVE METABOLIC PANEL
ALT: 18 U/L (ref 0–53)
AST: 18 U/L (ref 0–37)
Albumin: 3.7 g/dL (ref 3.5–5.2)
Calcium: 9 mg/dL (ref 8.4–10.5)
Chloride: 105 mEq/L (ref 96–112)
Creatinine, Ser: 0.99 mg/dL (ref 0.50–1.35)
Sodium: 140 mEq/L (ref 135–145)
Total Bilirubin: 0.6 mg/dL (ref 0.3–1.2)

## 2013-01-10 LAB — PROTIME-INR
INR: 1.03 (ref 0.00–1.49)
Prothrombin Time: 13.4 seconds (ref 11.6–15.2)

## 2013-01-10 LAB — SURGICAL PCR SCREEN
MRSA, PCR: NEGATIVE
Staphylococcus aureus: NEGATIVE

## 2013-01-10 NOTE — Patient Instructions (Addendum)
[place]  01/10/2013   Your procedure is scheduled on:  4-7 -2014  Report to [facility] at   [place]     AM ..  Call this number if you have problems the morning of surgery: [phone]  Or Presurgical Testing [phone](Linell Meldrum)   Remember: Follow any bowel prep instructions per MD office. For Cpap use: Bring mask and tubing only.   Do not eat food:After Midnight.    Take these medicines the morning of surgery with A SIP OF WATER: Allegra.   Do not wear jewelry, make-up or nail polish.  Do not wear lotions, powders, or perfumes. You may wear deodorant.  Do not shave 12 hours prior to first CHG shower(legs and under arms).(face and neck okay.)  Do not bring valuables to the hospital.  Contacts, dentures or bridgework,body piercing,  may not be worn into surgery.  Leave suitcase in the car. After surgery it may be brought to your room.  For patients admitted to the hospital, checkout time is 11:00 AM the day of discharge.   Patients discharged the day of surgery will not be allowed to drive home. Must have responsible person with you x 24 hours once discharged.  Name and phone number of your driver:  Wyndell Lepine Special Instructions: CHG(Chlorhedine 4%-"Hibiclens","Betasept","Aplicare") Shower Use Special Wash: see special instructions.(avoid face and genitals)   Please read over the following fact sheets that you were given: MRSA Information.    Failure to follow these instructions may result in Cancellation of your surgery.   Patient signature_______________________________________________________

## 2013-01-10 NOTE — Pre-Procedure Instructions (Signed)
01-10-13 EKG/ CXR(12-15-12)-Epic.

## 2013-01-14 ENCOUNTER — Telehealth: Payer: Self-pay | Admitting: *Deleted

## 2013-01-14 NOTE — H&P (Signed)
Reason for visit: Radioactive seed implantation for management of low risk prostate cancer recent history summarized below:   History of Present Illness       Kenneth Wiggins comes in today status post his attempted robotic prostatectomy. At the time of surgery anesthesia services noticed immediately that there were some ventilatory issues. He had low tidal volumes with high peak airway pressures. We attempted his prostatectomy and were able to insert all trochars and insufflated his abdomen. The bladder was freed and the endopelvic fascia was incised. At that point we were getting into the place where there would be no stopping if we went ahead with prostatectomy. Anesthesia services at that time felt that his tidal volumes had gone down significantly and peak airway pressures continued to increase. He also began experiencing some ventricular ectopy. Did not fill comfortable with proceeding with surgery. I discuss things with Kenneth Wiggins wife and told her the options including conversion to an open prostatectomy although there would be a relatively high risk of erectile dysfunction due to the limited ability to perform nerve sparing given his difficult anatomy and body habitus as well as an increased risk of urinary incontinence. We discussed with her potential alternative therapy such as radiation therapy/seed implantation. She did not feel comfortable making the final decision for Kenneth Wiggins and therefore we elected to stop his operation and allow him to make a formal decision at a later date. It was clear that we could not proceed safely with a laparoscopic prostatectomy. The patient has done fine from surgery and has had no problems or issues. All of his port sites are healing appropriately. His catheter of course was removed really right after surgery.             Kenneth Wiggins had a prostate biopsy on 10/28.  His PSA was 4.75 on 8/5.  It was 3.93 at time of biopsy.  It shows Gleason 6 adenocarcinoma in < 5%   to 60% of ten out of twelve cores.  He has good erections.  I explained to him that his prognosis depends on the Gleason score, the PSA and the volume of the disease.  He has favorable Gleason and PSA and he has large volume disease. As per the [facility] he has 84% chance of having organ confined disease, 19% risk f extraprostatic extension, 1% risk of seminal vesicle invasion and 1.4% risk of lymph node involvement.  10 year progression free probability after radical prostatectomy is 93%.  Probability of cancer specific survival is 99% at 15 years.    Prostate was between 20 and 25 g. The patient appears to have a favorable to intermediate risk prostate cancer. Preoperative shim score is 25/25. AUA score is zero.   Vitals Vital Signs [Data Includes: Last 1 Day]  Blood Pressure: 129 / 79 Temperature: 98.9 F Heart Rate: 102  Obese well-developed well-nourished male in no acute distress Respiratory: Normal effort Cardiac: Regular rate and rhythm Abdomen: Soft no tenderness.  Extremities: Nontender no edema Genitourinary: Normal external genitalia. 1+ prostate without nodularity.   Assessment Assessed  1. Adenocarcinoma Of The Prostate Gland 185  Plan Adenocarcinoma Of The Prostate Gland (185)  1. Radiation Oncology Referral Referral  Referral  Requested for: 20Feb2014  Discussion/Summary  Favorable/low risk clinical stage TI C. adenocarcinoma prostate. Again we discussed his options which include an attempt at open prostatectomy versus alternative therapies. I do think he is a reasonable candidate for seed implantation. Obviously this is not ideal given  his age but he does have a PSA of only 4 with a relatively small prostate and no significant voiding complaints. His cancer is all Gleason 6. I've had a preliminary discussion with Dr. Arloa Koh about Kenneth Wiggins's case. He is going to see him in consultation and discuss the pros and cons of seed implantation with him in  more detail.

## 2013-01-14 NOTE — Telephone Encounter (Signed)
Called patient to remind of procedure for 01-17-13, lvm for a return call

## 2013-01-17 ENCOUNTER — Ambulatory Visit (HOSPITAL_COMMUNITY)
Admission: RE | Admit: 2013-01-17 | Discharge: 2013-01-17 | Disposition: A | Discharge status: Home / Self Care | Payer: BC Managed Care – PPO | Source: Ambulatory Visit | Attending: Urology | Admitting: Urology

## 2013-01-17 ENCOUNTER — Encounter: Payer: Self-pay | Admitting: Radiation Oncology

## 2013-01-17 ENCOUNTER — Ambulatory Visit (HOSPITAL_COMMUNITY): Payer: BC Managed Care – PPO

## 2013-01-17 ENCOUNTER — Ambulatory Visit (HOSPITAL_COMMUNITY): Payer: BC Managed Care – PPO | Admitting: Registered Nurse

## 2013-01-17 ENCOUNTER — Encounter (HOSPITAL_COMMUNITY): Payer: Self-pay | Admitting: Registered Nurse

## 2013-01-17 ENCOUNTER — Encounter (HOSPITAL_COMMUNITY)
Admission: RE | Disposition: A | Discharge status: Home / Self Care | Payer: Self-pay | Source: Ambulatory Visit | Attending: Urology

## 2013-01-17 ENCOUNTER — Encounter (HOSPITAL_COMMUNITY): Payer: Self-pay | Admitting: *Deleted

## 2013-01-17 DIAGNOSIS — Z01812 Encounter for preprocedural laboratory examination: Secondary | ICD-10-CM | POA: Insufficient documentation

## 2013-01-17 DIAGNOSIS — C61 Malignant neoplasm of prostate: Secondary | ICD-10-CM | POA: Insufficient documentation

## 2013-01-17 HISTORY — PX: RADIOACTIVE SEED IMPLANT: SHX5150

## 2013-01-17 SURGERY — INSERTION, RADIATION SOURCE, PROSTATE
Anesthesia: General | Wound class: Clean Contaminated

## 2013-01-17 MED ORDER — LIDOCAINE HCL 2 % EX GEL
CUTANEOUS | Status: AC
  Filled 2013-01-17: qty 10

## 2013-01-17 MED ORDER — CIPROFLOXACIN HCL 500 MG PO TABS: 500.0000 mg | ORAL_TABLET | Freq: Two times a day (BID) | ORAL | Status: DC

## 2013-01-17 MED ORDER — FLEET ENEMA 7-19 GM/118ML RE ENEM: 1.0000 | ENEMA | Freq: Once | RECTAL | Status: DC

## 2013-01-17 MED ORDER — KETAMINE HCL 10 MG/ML IJ SOLN
INTRAMUSCULAR | Status: DC | PRN
  Administered 2013-01-17: 20 mg via INTRAVENOUS

## 2013-01-17 MED ORDER — GLYCOPYRROLATE 0.2 MG/ML IJ SOLN
INTRAMUSCULAR | Status: DC | PRN
  Administered 2013-01-17: .8 mg via INTRAVENOUS

## 2013-01-17 MED ORDER — STERILE WATER FOR IRRIGATION IR SOLN
Status: DC | PRN
  Administered 2013-01-17: 500 mL

## 2013-01-17 MED ORDER — IOHEXOL 300 MG/ML  SOLN
INTRAMUSCULAR | Status: DC | PRN
  Administered 2013-01-17: 10 mL

## 2013-01-17 MED ORDER — LIDOCAINE HCL 2 % EX GEL
CUTANEOUS | Status: DC | PRN
  Administered 2013-01-17: 1

## 2013-01-17 MED ORDER — PROPOFOL 10 MG/ML IV BOLUS
INTRAVENOUS | Status: DC | PRN
  Administered 2013-01-17: 250 mg via INTRAVENOUS

## 2013-01-17 MED ORDER — ROCURONIUM BROMIDE 100 MG/10ML IV SOLN
INTRAVENOUS | Status: DC | PRN
  Administered 2013-01-17: 10 mg via INTRAVENOUS
  Administered 2013-01-17: 30 mg via INTRAVENOUS
  Administered 2013-01-17 (×2): 10 mg via INTRAVENOUS

## 2013-01-17 MED ORDER — STERILE WATER FOR IRRIGATION IR SOLN
Status: DC | PRN
  Administered 2013-01-17: 3000 mL

## 2013-01-17 MED ORDER — LACTATED RINGERS IV SOLN
INTRAVENOUS | Status: DC | PRN
  Administered 2013-01-17: 07:00:00 via INTRAVENOUS

## 2013-01-17 MED ORDER — KETOROLAC TROMETHAMINE 30 MG/ML IJ SOLN: 15.0000 mg | Freq: Once | INTRAMUSCULAR | Status: DC | PRN

## 2013-01-17 MED ORDER — FENTANYL CITRATE 0.05 MG/ML IJ SOLN
INTRAMUSCULAR | Status: DC | PRN
  Administered 2013-01-17 (×3): 50 ug via INTRAVENOUS

## 2013-01-17 MED ORDER — SUCCINYLCHOLINE CHLORIDE 20 MG/ML IJ SOLN
INTRAMUSCULAR | Status: DC | PRN
  Administered 2013-01-17: 140 mg via INTRAVENOUS

## 2013-01-17 MED ORDER — FENTANYL CITRATE 0.05 MG/ML IJ SOLN: 25.0000 ug | INTRAMUSCULAR | Status: DC | PRN

## 2013-01-17 MED ORDER — MIDAZOLAM HCL 5 MG/5ML IJ SOLN
INTRAMUSCULAR | Status: DC | PRN
  Administered 2013-01-17: 2 mg via INTRAVENOUS

## 2013-01-17 MED ORDER — ONDANSETRON HCL 4 MG/2ML IJ SOLN
INTRAMUSCULAR | Status: DC | PRN
  Administered 2013-01-17: 4 mg via INTRAVENOUS

## 2013-01-17 MED ORDER — NEOSTIGMINE METHYLSULFATE 1 MG/ML IJ SOLN
INTRAMUSCULAR | Status: DC | PRN
  Administered 2013-01-17: 5 mg via INTRAVENOUS

## 2013-01-17 MED ORDER — CIPROFLOXACIN IN D5W 400 MG/200ML IV SOLN
INTRAVENOUS | Status: AC
  Filled 2013-01-17: qty 200

## 2013-01-17 MED ORDER — LIDOCAINE HCL (CARDIAC) 20 MG/ML IV SOLN
INTRAVENOUS | Status: DC | PRN
  Administered 2013-01-17: 100 mg via INTRAVENOUS

## 2013-01-17 MED ORDER — EPHEDRINE SULFATE 50 MG/ML IJ SOLN
INTRAMUSCULAR | Status: DC | PRN
  Administered 2013-01-17 (×2): 5 mg via INTRAVENOUS

## 2013-01-17 MED ORDER — CIPROFLOXACIN IN D5W 400 MG/200ML IV SOLN
400.0000 mg | INTRAVENOUS | Status: AC
  Administered 2013-01-17: 400 mg via INTRAVENOUS

## 2013-01-17 MED ORDER — PROMETHAZINE HCL 25 MG/ML IJ SOLN: 6.2500 mg | INTRAMUSCULAR | Status: DC | PRN

## 2013-01-17 MED ORDER — IOHEXOL 300 MG/ML  SOLN
INTRAMUSCULAR | Status: AC
  Filled 2013-01-17: qty 1

## 2013-01-17 MED ORDER — HYDROCODONE-ACETAMINOPHEN 5-325 MG PO TABS: 1.0000 | ORAL_TABLET | Freq: Four times a day (QID) | ORAL | Status: DC | PRN

## 2013-01-17 SURGICAL SUPPLY — 19 items
BAG URINE DRAINAGE (UROLOGICAL SUPPLIES) ×3 IMPLANT
BLADE SURG ROTATE 9660 (MISCELLANEOUS) ×2 IMPLANT
CATH FOLEY 2WAY SLVR  5CC 16FR (CATHETERS) ×2
CATH FOLEY 2WAY SLVR 5CC 16FR (CATHETERS) ×2 IMPLANT
CATH ROBINSON RED A/P 20FR (CATHETERS) ×2 IMPLANT
CLOTH BEACON ORANGE TIMEOUT ST (SAFETY) ×2 IMPLANT
COVER MAYO STAND STRL (DRAPES) ×2 IMPLANT
DRAPE BACK TABLE (DRAPES) ×1 IMPLANT
DRSG TEGADERM 4X4.75 (GAUZE/BANDAGES/DRESSINGS) ×2 IMPLANT
DRSG TEGADERM 8X12 (GAUZE/BANDAGES/DRESSINGS) ×2 IMPLANT
GLOVE BIO SURGEON STRL SZ7.5 (GLOVE) ×3 IMPLANT
GLOVE ECLIPSE 8.0 STRL XLNG CF (GLOVE) IMPLANT
GOWN STRL REIN XL XLG (GOWN DISPOSABLE) ×2 IMPLANT
HOLDER FOLEY CATH W/STRAP (MISCELLANEOUS) ×2 IMPLANT
Nucletron selectSeed ×1 IMPLANT
PACK CYSTO (CUSTOM PROCEDURE TRAY) ×2 IMPLANT
SYRINGE 10CC LL (SYRINGE) ×2 IMPLANT
UNDERPAD 30X30 INCONTINENT (UNDERPADS AND DIAPERS) ×4 IMPLANT
WATER STERILE IRR 500ML POUR (IV SOLUTION) ×2 IMPLANT

## 2013-01-17 NOTE — Interval H&P Note (Signed)
History and Physical Interval Note:  01/17/2013 7:44 AM  Kenneth Wiggins  has presented today for surgery, with the diagnosis of PROSTATE CANCER  The various methods of treatment have been discussed with the patient and family. After consideration of risks, benefits and other options for treatment, the patient has consented to  Procedure(s) with comments: RADIOACTIVE SEED IMPLANT (N/A) - W/ MURRAY  as a surgical intervention .  The patient's history has been reviewed, patient examined, no change in status, stable for surgery.  I have reviewed the patient's chart and labs.  Questions were answered to the patient's satisfaction.     Kenneth Wiggins S

## 2013-01-17 NOTE — Preoperative (Signed)
Beta Blockers   Reason not to administer Beta Blockers:Not Applicable 

## 2013-01-17 NOTE — Transfer of Care (Signed)
Immediate Anesthesia Transfer of Care Note  Patient: Kenneth Wiggins  Procedure(s) Performed: Procedure(s) with comments: RADIOACTIVE SEED IMPLANT (N/A) - W/ MURRAY   Patient Location: PACU  Anesthesia Type:General  Level of Consciousness: awake, alert , oriented and patient cooperative  Airway & Oxygen Therapy: Patient Spontanous Breathing and Patient connected to face mask oxygen  Post-op Assessment: Report given to PACU RN, Post -op Vital signs reviewed and stable and Patient moving all extremities  Post vital signs: Reviewed and stable  Complications: No apparent anesthesia complications

## 2013-01-17 NOTE — Progress Notes (Signed)
[facility] Operative Procedure Note  Name: Kenneth Wiggins MRN: [id number]  Date:   12/07/2012           DOB: August 27, 1961  Status:outpatient    :[id number], MD  Dr. Rana Snare DIAGNOSIS: A 52 year old gentlemen with stage T1 C. adenocarcinoma of the prostate with a Gleason of 6 and a PSA of 4.75.  PROCEDURE: Insertion of radioactive I-125 seeds into the prostate gland.  RADIATION DOSE: 145 Gy, definitive therapy.  TECHNIQUE: AISON NEIER was brought to the operating room with Dr. Sabra Heck. He was placed in the dorsolithotomy position. He was catheterized and a rectal tube was inserted. The perineum was shaved, prepped and draped. The ultrasound probe was then introduced into the rectum to see the prostate gland.  TREATMENT DEVICE: A needle grid was attached to the ultrasound probe stand and anchor needles were placed.  COMPLEX ISODOSE CALCULATION: The prostate was imaged in 3D using a sagittal sweep of the prostate probe. These images were transferred to the planning computer. There, the prostate, urethra and rectum were defined on each axial reconstructed image. Then, the software created an optimized plan and a few seed positions were adjusted. Then the accepted plan was uploaded to the seed Selectron afterloading unit.  SPECIAL TREATMENT PROCEDURE/SUPERVISION AND HANDLING: The Nucletron FIRST system was used to place the needles under sagittal guidance. A total of 30 needles were used to deposit 87 seeds in the prostate gland. The individual seed activity was 0.32 mCi for a total implant activity of 27.8 mCi. At the time of post-implant cystoscopy one seed was removed from the prostatic urethra for a net total of 86 seeds implanted.  COMPLEX SIMULATION: At the end of the procedure, an anterior radiograph of the pelvis was obtained to document seed positioning and count. Cystoscopy was performed to check the urethra and  bladder.  MICRODOSIMETRY: At the end of the procedure, the patient was emitting 0.03 mrem/hr at 1 meter. Accordingly, he was considered safe for hospital discharge.  PLAN: The patient will return to the radiation oncology clinic for post implant CT dosimetry in three weeks.

## 2013-01-17 NOTE — Progress Notes (Signed)
[facility] End of Treatment Note  Name:Kairav RAFFAELE TRONOLONE  Date: 01/17/2013 [id number] DOB:[birth year]   Status:outpatient    CC: Tommy Medal, MD  Dr. Rana Snare  REFERRING PHYSICIAN: Dr. Rana Snare    DIAGNOSIS: Stage TI C. favorable risk adenocarcinoma prostate   INDICATION FOR TREATMENT: Curative   TREATMENT DATES: The prostate seed implant date 01/17/2013                          SITE/DOSE:   Prostate, 14,500 cGy, isotope I-125 implanting a  net total of 86 seeds in 30 active needles. Individual seed activity 0.3 to millicuries per seed for a total implant activity of 27.8 mCi.                                         NARRATIVE:    The patient appears to have undergone a successful Nucletron seed Selectron implant with Dr. Risa Grill.                        PLAN: Routine followup in 3 weeks.. Patient instructed to call if questions or worsening complaints in interim. At that time we will obtain a CT scan for his post implant dosimetry.

## 2013-01-17 NOTE — Anesthesia Postprocedure Evaluation (Signed)
  Anesthesia Post-op Note  Patient: Kenneth Wiggins  Procedure(s) Performed: Procedure(s) (LRB): RADIOACTIVE SEED IMPLANT (N/A)  Patient Location: PACU  Anesthesia Type: General  Level of Consciousness: awake and alert   Airway and Oxygen Therapy: Patient Spontanous Breathing  Post-op Pain: mild  Post-op Assessment: Post-op Vital signs reviewed, Patient's Cardiovascular Status Stable, Respiratory Function Stable, Patent Airway and No signs of Nausea or vomiting  Last Vitals:  Filed Vitals:   01/17/13 1030  BP: 135/85  Pulse: 100  Temp: 36.7 C  Resp: 16    Post-op Vital Signs: stable   Complications: No apparent anesthesia complications

## 2013-01-17 NOTE — Anesthesia Preprocedure Evaluation (Addendum)
Anesthesia Evaluation  Patient identified by MRN, date of birth, ID band Patient awake    Reviewed: Allergy & Precautions, H&P , NPO status , Patient's Chart, lab work & pertinent test results  History of Anesthesia Complications (+) AWARENESS UNDER ANESTHESIA  Airway Mallampati: III TM Distance: <3 FB Neck ROM: Full    Dental  (+) Dental Advisory Given   Pulmonary asthma , sleep apnea ,  breath sounds clear to auscultation  + decreased breath sounds      Cardiovascular Rhythm:Regular Rate:Normal     Neuro/Psych negative neurological ROS  negative psych ROS   GI/Hepatic Neg liver ROS, GERD-  ,  Endo/Other  Morbid obesity  Renal/GU negative Renal ROS  negative genitourinary   Musculoskeletal negative musculoskeletal ROS (+)   Abdominal   Peds negative pediatric ROS (+)  Hematology negative hematology ROS (+)   Anesthesia Other Findings   Reproductive/Obstetrics negative OB ROS                          Anesthesia Physical Anesthesia Plan  ASA: III  Anesthesia Plan: General   Post-op Pain Management:    Induction: Intravenous  Airway Management Planned: Oral ETT and Video Laryngoscope Planned  Additional Equipment:   Intra-op Plan:   Post-operative Plan: Extubation in OR  Informed Consent: I have reviewed the patients History and Physical, chart, labs and discussed the procedure including the risks, benefits and alternatives for the proposed anesthesia with the patient or authorized representative who has indicated his/her understanding and acceptance.   Dental advisory given  Plan Discussed with: CRNA and Surgeon  Anesthesia Plan Comments:         Anesthesia Quick Evaluation

## 2013-01-17 NOTE — Op Note (Signed)
Preoperative diagnosis: Clinical stage TI C adenocarcinoma the prostate  Postoperative diagnosis: Same  Procedure: I-125 prostate seed implantation with Nucletron robotic implanter  Surgeon: Bernestine Amass M.D. , Arloa Koh, M.D.  Anesthesia: Gen.  Indications: Patient  was diagnosed with clinical stage TI C prostate cancer. We had extensive discussion with him about treatment options versus. He elected to proceed with seed implantation. He underwent consultation my office as well as with Dr. Arloa Koh. He appeared to understand the advantages disadvantages potential risks of this treatment option. Full informed consent has been obtained. The patient is had preoperative ciprofloxacin. PAS compression boots were placed.  Technique and findings: Patient was brought the operating room where he had successful induction of general anesthesia. He was placed in lithotomy position and prepped and draped in usual manner. Appropriate surgical timeout was performed. Radiation oncology department placed a transrectal ultrasound probe anchoring stand. Foley catheter with contrast in the balloon was inserted without difficulty. Anchoring needles were placed within the prostate. Real-time contouring of the urethra prostate and rectum were performed and the dosing parameters were established. Targeted dose was 145 gray. We then came to the operating suite suite for placement of the needles. A second timeout was performed. All needle passage was done with real-time transrectal ultrasound guidance with the sagittal plane. A total of 32 needles were placed. See implantation itself was done with the robotic implanter. 87 active seeds were implanted. A Foley catheter was removed and flexible cystoscopy fdid show show one seed in the bladder taht was removed. The Foley catheter was inserted which drained clear urine. The patient was brought to recovery room in stable condition. That changed total active seeds to 86.

## 2013-01-17 NOTE — Progress Notes (Signed)
Discharge teaching done with pt and wife including foley maintenance, foley catheter removable,emptying of device,  see discharge papers.  Kenneth Wiggins from radiology oncology came by and spoke to pt and wife and answered all questions regarding radiation and the "seeds" that were implanted.  PIV catheter removed with cath tip intact.

## 2013-01-19 ENCOUNTER — Encounter (HOSPITAL_COMMUNITY): Payer: Self-pay | Admitting: Urology

## 2013-02-07 ENCOUNTER — Telehealth: Payer: Self-pay | Admitting: *Deleted

## 2013-02-07 NOTE — Telephone Encounter (Signed)
CALLED PATIENT TO REMIND OF APPTS. FOR 02-08-13, CONFIRMED APPTS. WITH PATIENT.

## 2013-02-08 ENCOUNTER — Ambulatory Visit
Admission: RE | Admit: 2013-02-08 | Discharge: 2013-02-08 | Disposition: A | Discharge status: Home / Self Care | Payer: BC Managed Care – PPO | Source: Ambulatory Visit | Attending: Radiation Oncology | Admitting: Radiation Oncology

## 2013-02-08 VITALS — BP 150/62 | HR 88 | Temp 98.6°F | Ht 71.0 in | Wt 332.7 lb

## 2013-02-08 DIAGNOSIS — C61 Malignant neoplasm of prostate: Secondary | ICD-10-CM

## 2013-02-08 NOTE — Progress Notes (Signed)
Mr. Fago here for post seed follow up.  He had his seeds placed three weeks ago.  He denies pain.  He does have some urinary urgency and his urinary stream seems a little weaker.  He states that first thing in the morning his urinary stream is low until he has a bowel movement and then the stream flows freely.  He denies fatigue, hematuria, diarrhea, and nocturia.

## 2013-02-08 NOTE — Progress Notes (Signed)
Complex CT simulation note: The patient was taken to the CT simulator. He was placed supine. His pelvis was scanned. The CT data set was sent to the [facility] system for contouring of his prostate and rectum. He'll then undergo 3-D simulation to assess the quality of his implant.

## 2013-02-08 NOTE — Progress Notes (Signed)
CC:  Dr. Rana Snare  Kenneth Wiggins returns today approximately 3 weeks following his seed implant in the management of his stage TI C. favorable risk adenocarcinoma prostate. He still doing well from a GU and GI standpoint although he does have some slowing of the stream with his first a.m. urination. He does well the rest of the day. He saw Dr. Risa Grill yesterday and he'll see him back in 3 months for a followup visit and PSA determination.  His CT scan today shows what appears to be an excellent seed distribution.  Physical examination: Alert and oriented. Filed Vitals:   02/08/13 1039  BP: 150/62  Pulse: 88  Temp: 98.6 F (37 C)   Rectal examination not performed today  Impression: Satisfactory progress.  Plan: We'll move ahead with his post implant dosimetry and for the results Dr. Risa Grill. Dr. Risa Grill will see him for a followup visit and PSA determination in approximately 3 months. I've not scheduled the patient for a formal followup visit and I ask that Dr. Risa Grill keep me posted on his progress.

## 2013-02-09 ENCOUNTER — Encounter: Payer: Self-pay | Admitting: Radiation Oncology

## 2013-02-09 NOTE — Progress Notes (Signed)
CC: Dr. Rana Snare, Dr. Tommy Medal  Post implant CT dosimetry/3-D simulation note:  The patient underwent post-implant CT dosimetry/3-D simulation today to assess the quality of his implant. His intraoperative prostate volume by ultrasound was 32.7 cc while his postoperative prostate volume was 35.3 cc by CT. This was a slight overestimation. Dose volume histograms were obtained for the prostate and rectum. His prostate D 90 is 122.5% and V100 97.9%, both excellent. Theoretically, if we performed dose volume histograms on a similar volume seen by ultrasound the prostate dose coverage would be even better. Only 0.22 cc of rectum received the prescribed dose of 14,500 cGy. In summary, Kenneth Wiggins has excellent post implant dosimetry with a low risk for late rectal toxicity.

## 2016-10-31 ENCOUNTER — Encounter (HOSPITAL_COMMUNITY): Payer: Self-pay | Admitting: Emergency Medicine

## 2016-10-31 ENCOUNTER — Emergency Department (HOSPITAL_COMMUNITY)
Admission: EM | Admit: 2016-10-31 | Discharge: 2016-11-01 | Disposition: A | Discharge status: Home / Self Care | Payer: BLUE CROSS/BLUE SHIELD | Attending: Emergency Medicine | Admitting: Emergency Medicine

## 2016-10-31 ENCOUNTER — Emergency Department (HOSPITAL_COMMUNITY): Payer: BLUE CROSS/BLUE SHIELD

## 2016-10-31 DIAGNOSIS — M79661 Pain in right lower leg: Secondary | ICD-10-CM | POA: Diagnosis not present

## 2016-10-31 DIAGNOSIS — J45909 Unspecified asthma, uncomplicated: Secondary | ICD-10-CM | POA: Insufficient documentation

## 2016-10-31 DIAGNOSIS — Z8546 Personal history of malignant neoplasm of prostate: Secondary | ICD-10-CM | POA: Insufficient documentation

## 2016-10-31 LAB — CBC WITH DIFFERENTIAL/PLATELET
BASOS ABS: 0 10*3/uL (ref 0.0–0.1)
BASOS PCT: 1 %
Eosinophils Absolute: 0.3 10*3/uL (ref 0.0–0.7)
Eosinophils Relative: 5 %
HEMATOCRIT: 45.7 % (ref 39.0–52.0)
Hemoglobin: 16.2 g/dL (ref 13.0–17.0)
Lymphocytes Relative: 42 %
Lymphs Abs: 2.6 10*3/uL (ref 0.7–4.0)
MCH: 29.5 pg (ref 26.0–34.0)
MCHC: 35.4 g/dL (ref 30.0–36.0)
MCV: 83.1 fL (ref 78.0–100.0)
Monocytes Absolute: 0.5 10*3/uL (ref 0.1–1.0)
Monocytes Relative: 8 %
NEUTROS ABS: 2.8 10*3/uL (ref 1.7–7.7)
NEUTROS PCT: 44 %
Platelets: 231 10*3/uL (ref 150–400)
RBC: 5.5 MIL/uL (ref 4.22–5.81)
RDW: 13.6 % (ref 11.5–15.5)
WBC: 6.3 10*3/uL (ref 4.0–10.5)

## 2016-10-31 LAB — BASIC METABOLIC PANEL
ANION GAP: 10 (ref 5–15)
BUN: 10 mg/dL (ref 6–20)
CALCIUM: 9.3 mg/dL (ref 8.9–10.3)
CO2: 24 mmol/L (ref 22–32)
Chloride: 101 mmol/L (ref 101–111)
Creatinine, Ser: 1.23 mg/dL (ref 0.61–1.24)
GFR calc non Af Amer: >60 mL/min (ref >60)
Glucose, Bld: 96 mg/dL (ref 65–99)
Potassium: 3.9 mmol/L (ref 3.5–5.1)
SODIUM: 135 mmol/L (ref 135–145)

## 2016-10-31 LAB — PROTIME-INR
INR: 1.04
PROTHROMBIN TIME: 13.7 s (ref 11.4–15.2)

## 2016-10-31 NOTE — ED Triage Notes (Signed)
Pt states he is having right calf pain 9/10 when he stands on his legs, pain is in and out for about a week, getting worse today and some lower back pain. Denies any injury, fever or chills.

## 2016-11-01 ENCOUNTER — Ambulatory Visit (HOSPITAL_BASED_OUTPATIENT_CLINIC_OR_DEPARTMENT_OTHER)
Admit: 2016-11-01 | Discharge: 2016-11-01 | Disposition: A | Discharge status: Home / Self Care | Payer: BLUE CROSS/BLUE SHIELD | Attending: Vascular Surgery | Admitting: Vascular Surgery

## 2016-11-01 DIAGNOSIS — M79661 Pain in right lower leg: Secondary | ICD-10-CM | POA: Diagnosis not present

## 2016-11-01 MED ORDER — ENOXAPARIN SODIUM 150 MG/ML ~~LOC~~ SOLN
1.5000 mg/kg | SUBCUTANEOUS | Status: AC
  Administered 2016-11-01: 240 mg via SUBCUTANEOUS
  Filled 2016-11-01: qty 1.59

## 2016-11-01 MED ORDER — OXYCODONE-ACETAMINOPHEN 5-325 MG PO TABS
2.0000 | ORAL_TABLET | Freq: Once | ORAL | Status: AC
  Administered 2016-11-01: 2 via ORAL
  Filled 2016-11-01: qty 2

## 2016-11-01 MED ORDER — HYDROCODONE-ACETAMINOPHEN 5-325 MG PO TABS: 1.0000 | ORAL_TABLET | Freq: Four times a day (QID) | ORAL | 0 refills | Status: DC | PRN

## 2016-11-01 NOTE — Progress Notes (Signed)
*  PRELIMINARY RESULTS* Vascular Ultrasound Right lower extremity venous duplex has been completed.  Preliminary findings: No evidence of deep vein thrombosis or baker's cysts in the visualized veins of the right lower extremity.   Everrett Coombe 11/01/2016, 8:24 AM

## 2016-11-01 NOTE — Discharge Instructions (Signed)
Take Norco as needed for severe pain. Return at 8 AM for an ultrasound of your right leg to rule out a blood clot. If your ultrasound is negative, we advise that you follow-up with your primary care doctor for persistent symptoms. You may return sooner for any new or concerning symptoms.

## 2016-11-12 NOTE — ED Provider Notes (Signed)
[facility] DEPT Provider Note   CSN: [id number] Arrival date & time: 10/31/16  6283   History   Chief Complaint Chief Complaint  Patient presents with  . Leg Pain    right calf pain  . Back Pain    HPI Kenneth Wiggins is a 56 y.o. male.  Patient works as a Administrator   The history is provided by the patient. No language interpreter was used.  Leg Pain   This is a new problem. Episode onset: 1 week ago. The problem occurs daily. Progression since onset: waxing and waning. The pain is present in the right lower leg. The quality of the pain is described as aching and dull. The pain is moderate. Pertinent negatives include full range of motion and no itching. The symptoms are aggravated by activity (ambulation). There has been no history of extremity trauma.  Back Pain   Associated symptoms include leg pain.    Past Medical History:  Diagnosis Date  . Asthma as child   hx of  . Cancer [facility])    prostate - psa 3.93 on 07/06/2012  . GERD (gastroesophageal reflux disease)    hx of  . H/O hiatal hernia 15 years ago  . Prostate cancer ([place]) 08/09/12   Gleason 3+3=6, vol 20-25 gm  . Prostate cancer ([place]) 01-10-13   Seed implant to be done due to Robotic surgery aborted  . Sleep apnea    CPAP    Patient Active Problem List   Diagnosis Date Noted  . Prostate cancer [facility])     Past Surgical History:  Procedure Laterality Date  . HERNIA REPAIR  as child  . KNEE SURGERY Left as child   tore a ligament   . 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 RADIOACTIVE SEED IMPLANT N/A 01/17/2013   Procedure: RADIOACTIVE SEED IMPLANT;  Surgeon: Bernestine Amass, MD;  Location: [facility] ORS;  Service: Urology;  Laterality: N/A;  W/ MURRAY   . ROBOT ASSISTED LAPAROSCOPIC RADICAL PROSTATECTOMY N/A 11/24/2012   Procedure: Attempted ROBOTIC ASSISTED LAPAROSCOPIC RADICAL PROSTATECTOMY, Abandoned;  Surgeon: Bernestine Amass, MD;  Location: [facility]S;  Service: Urology;  Laterality: N/A;  . scrotum  explotion Left   . testical growth removed    . VASECTOMY         Home Medications    Prior to Admission medications   Medication Sig Start Date End Date Taking? Authorizing Provider  fexofenadine (ALLEGRA) 180 MG tablet Take 180 mg by mouth daily.    Historical Provider, MD  HYDROcodone-acetaminophen (NORCO/VICODIN) 5-325 MG tablet Take 1-2 tablets by mouth every 6 (six) hours as needed. 11/01/16   Antonietta Breach, PA-C    Family History Family History  Problem Relation Age of Onset  . Cancer Maternal Aunt     colon  . Cancer Paternal Uncle     prostate, 2 uncles  . Cancer Daughter     angiosarcoma age 23  . Prostate cancer      Social History Social History  Substance Use Topics  . Smoking status: Never Smoker  . Smokeless tobacco: Never Used  . Alcohol use No     Allergies   Patient has no known allergies.   Review of Systems Review of Systems  Musculoskeletal: Positive for back pain.  Skin: Negative for itching.   Ten systems reviewed and are negative for acute change, except as noted in the HPI.    Physical Exam Updated Vital Signs BP 151/96 (BP Location: Right  Arm)   Pulse 80   Temp 98.9 F (37.2 C) (Oral)   Resp 22   Ht 5\' 11"  (1.803 m)   Wt (!) 158.8 kg   SpO2 98%   BMI 48.82 kg/m   Physical Exam  Constitutional: He is oriented to person, place, and time. He appears well-developed and well-nourished. No distress.  Nontoxic and in NAD  HENT:  Head: Normocephalic and atraumatic.  Eyes: Conjunctivae and EOM are normal. No scleral icterus.  Neck: Normal range of motion.  Cardiovascular: Normal rate, regular rhythm and intact distal pulses.   DP pulse 2+ in the RLE. No pitting edema to BLE.  Pulmonary/Chest: Effort normal. No respiratory distress.  Musculoskeletal: Normal range of motion.  No palpable cords in the RLE. Compartments soft.  Neurological: He is alert and oriented to person, place, and time. He exhibits normal muscle tone.  Coordination normal.  Sensation to light touch intact bilaterally.  Skin: Skin is warm and dry. No rash noted. He is not diaphoretic. No erythema. No pallor.  Psychiatric: He has a normal mood and affect. His behavior is normal.  Nursing note and vitals reviewed.    ED Treatments / Results  Labs (all labs ordered are listed, but only abnormal results are displayed) Labs Reviewed  BASIC METABOLIC PANEL  CBC WITH DIFFERENTIAL/PLATELET  PROTIME-INR    EKG  EKG Interpretation None       Radiology No results found.  Procedures Procedures (including critical care time)  Medications Ordered in ED Medications  enoxaparin (LOVENOX) injection 240 mg (240 mg Subcutaneous Given 11/01/16 0130)  oxyCODONE-acetaminophen (PERCOCET/ROXICET) 5-325 MG per tablet 2 tablet (2 tablets Oral Given 11/01/16 0128)     Initial Impression / Assessment and Plan / ED Course  I have reviewed the triage vital signs and the nursing notes.  Pertinent labs & imaging results that were available during my care of the patient were reviewed by me and considered in my medical decision making (see chart for details).     Patient presenting for posterior right calf pain times one week. Patient neurovascularly intact. No pitting edema. No signs of secondary infection or cellulitis. Compartments soft. Patient works as a Administrator. There is underlying concern for DVT. Unable to obtain vascular ultrasound at this hour of the night. Patient to return for duplex in the morning. He has been given a shot of Lovenox for DVT treatment coverage. Return precautions discussed and provided. Patient discharged in satisfactory condition with no unaddressed concerns.   Final Clinical Impressions(s) / ED Diagnoses   Final diagnoses:  Right calf pain    New Prescriptions Discharge Medication List as of 11/01/2016 12:32 AM       Antonietta Breach, PA-C 11/12/16 7035    Everlene Balls, MD 11/14/16 [phone]

## 2016-11-17 ENCOUNTER — Encounter: Payer: Self-pay | Admitting: Sports Medicine

## 2016-11-17 ENCOUNTER — Ambulatory Visit (INDEPENDENT_AMBULATORY_CARE_PROVIDER_SITE_OTHER): Payer: BLUE CROSS/BLUE SHIELD | Admitting: Sports Medicine

## 2016-11-17 VITALS — BP 149/85 | HR 86 | Ht 71.0 in | Wt 338.0 lb

## 2016-11-17 DIAGNOSIS — M79661 Pain in right lower leg: Secondary | ICD-10-CM

## 2016-11-17 MED ORDER — NAPROXEN 500 MG PO TABS: 500.0000 mg | ORAL_TABLET | Freq: Two times a day (BID) | ORAL | 1 refills | Status: DC | PRN

## 2016-11-17 NOTE — Progress Notes (Signed)
   Subjective:    Patient ID: Kenneth Wiggins, male    DOB: 1961/09/21, 56 y.o.   MRN: [id number]  HPI Mr. Sustaita is a 56 yo M w/ PMH of prostate cancer s/p radiation seeds in for evaluation of "calf spasm." Describes 10 yr. History of intermittent pain in r. Calf. Reports 2 episodes of "sharp" 9/10 pain over achilles tendon radiating into calf, w/ most recent episode leading to an ER visit. Pain typically resolves within 1 day without intervention. Improved w/ heat and worsened w/ walking and dorsiflexion. No h/o of leg injury.   Review of Systems  As per HPI. Denies weakness or loss of sensation.     Objective:   Physical Exam  General: well-appearing obese male in NAD, A&Ox3 Card: nl S1/S2, no m/r/g  Extremities: 2+ edema in BLLE, 2+ dorsalis pedis MSK: full ROM w/ 5/5 strength to dorsiflexion/plantarflexion, no pain to palpation over calf, negative squeeze test  Studies: - DVT US (11/15/16)- no evidence of DVT - Xray of R Tib/fib negative for anything acute    Assessment & Plan:   56yo M w/ intermittent calf spasm occurring a few times a year w/ nl PE most likely calf spasm. No h/o back or knee pathology to suggest reason for spasm.  1. 500mg  Naproxen Sodium BID PRN pain 2. Return for increasing frequency of pain.

## 2017-06-28 ENCOUNTER — Encounter: Payer: Self-pay | Admitting: Emergency Medicine

## 2017-06-28 ENCOUNTER — Emergency Department: Payer: BLUE CROSS/BLUE SHIELD

## 2017-06-28 ENCOUNTER — Emergency Department
Admission: EM | Admit: 2017-06-28 | Discharge: 2017-06-28 | Disposition: A | Discharge status: Home / Self Care | Payer: BLUE CROSS/BLUE SHIELD | Attending: Emergency Medicine | Admitting: Emergency Medicine

## 2017-06-28 DIAGNOSIS — M25551 Pain in right hip: Secondary | ICD-10-CM | POA: Diagnosis not present

## 2017-06-28 DIAGNOSIS — W01198A Fall on same level from slipping, tripping and stumbling with subsequent striking against other object, initial encounter: Secondary | ICD-10-CM | POA: Insufficient documentation

## 2017-06-28 DIAGNOSIS — S79911A Unspecified injury of right hip, initial encounter: Secondary | ICD-10-CM | POA: Diagnosis present

## 2017-06-28 DIAGNOSIS — Y9301 Activity, walking, marching and hiking: Secondary | ICD-10-CM | POA: Insufficient documentation

## 2017-06-28 DIAGNOSIS — M25559 Pain in unspecified hip: Secondary | ICD-10-CM

## 2017-06-28 DIAGNOSIS — Z79899 Other long term (current) drug therapy: Secondary | ICD-10-CM | POA: Insufficient documentation

## 2017-06-28 DIAGNOSIS — Z8546 Personal history of malignant neoplasm of prostate: Secondary | ICD-10-CM | POA: Diagnosis not present

## 2017-06-28 DIAGNOSIS — J45909 Unspecified asthma, uncomplicated: Secondary | ICD-10-CM | POA: Diagnosis not present

## 2017-06-28 DIAGNOSIS — Y998 Other external cause status: Secondary | ICD-10-CM | POA: Diagnosis not present

## 2017-06-28 DIAGNOSIS — W19XXXA Unspecified fall, initial encounter: Secondary | ICD-10-CM

## 2017-06-28 DIAGNOSIS — S7001XA Contusion of right hip, initial encounter: Secondary | ICD-10-CM | POA: Insufficient documentation

## 2017-06-28 DIAGNOSIS — Y92018 Other place in single-family (private) house as the place of occurrence of the external cause: Secondary | ICD-10-CM | POA: Insufficient documentation

## 2017-06-28 DIAGNOSIS — S73101A Unspecified sprain of right hip, initial encounter: Secondary | ICD-10-CM | POA: Diagnosis not present

## 2017-06-28 DIAGNOSIS — S7000XA Contusion of unspecified hip, initial encounter: Secondary | ICD-10-CM

## 2017-06-28 MED ORDER — IBUPROFEN 800 MG PO TABS: 800.0000 mg | ORAL_TABLET | Freq: Three times a day (TID) | ORAL | 0 refills | Status: DC | PRN

## 2017-06-28 MED ORDER — OXYCODONE-ACETAMINOPHEN 5-325 MG PO TABS: 1.0000 | ORAL_TABLET | Freq: Four times a day (QID) | ORAL | 0 refills | Status: DC | PRN

## 2017-06-28 MED ORDER — OXYCODONE-ACETAMINOPHEN 5-325 MG PO TABS
1.0000 | ORAL_TABLET | Freq: Once | ORAL | Status: AC
  Administered 2017-06-28: 1 via ORAL
  Filled 2017-06-28: qty 1

## 2017-06-28 NOTE — Discharge Instructions (Signed)
Take medication as prescribed. Return to emergency department if symptoms worsen and follow-up with PCP as needed.   °

## 2017-06-28 NOTE — ED Triage Notes (Signed)
Patient sustained a mechanical fall today. Patient states he is experiencing right sided groin pain. Patient also struck head. However denies LOC , denies anticoagulant use, denies neck or back pain.

## 2017-06-28 NOTE — ED Provider Notes (Signed)
[facility] Emergency Department Provider Note   ____________________________________________   I have reviewed the triage vital signs and the nursing notes.   HISTORY  Chief Complaint Fall    HPI Kenneth Wiggins is a 56 y.o. male presents to the emergency department with right hip pain after falling on his deck earlier today. Patient reports stepping out of his deck hitting a slick spot then falling hitting the back of his head and landing directly on the right hip. Patient denies loss of consciousness and recalls the event. Patient reports immediate right hip pain, difficulty ranging hip joint and severe pain with weightbearing and walking following the injury. Patient denies any past history of right hip or lower extremity injury. Patient denies any sensation changes to the right lower extremity since the injury. Patient denies fever, chills, headache, vision changes, chest pain, chest tightness, shortness of breath, abdominal pain, nausea and vomiting.  Past Medical History:  Diagnosis Date  . Asthma as child   hx of  . Cancer [facility])    prostate - psa 3.93 on 07/06/2012  . GERD (gastroesophageal reflux disease)    hx of  . H/O hiatal hernia 15 years ago  . Prostate cancer ([place]) 08/09/12   Gleason 3+3=6, vol 20-25 gm  . Prostate cancer ([place]) 01-10-13   Seed implant to be done due to Robotic surgery aborted  . Sleep apnea    CPAP    Patient Active Problem List   Diagnosis Date Noted  . Prostate cancer [facility])     Past Surgical History:  Procedure Laterality Date  . HERNIA REPAIR  as child  . KNEE SURGERY Left as child   tore a ligament   . 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 RADIOACTIVE SEED IMPLANT N/A 01/17/2013   Procedure: RADIOACTIVE SEED IMPLANT;  Surgeon: Bernestine Amass, MD;  Location: [facility];  Service: Urology;  Laterality: N/A;  W/ MURRAY   . ROBOT ASSISTED LAPAROSCOPIC RADICAL PROSTATECTOMY N/A 11/24/2012   Procedure:  Attempted ROBOTIC ASSISTED LAPAROSCOPIC RADICAL PROSTATECTOMY, Abandoned;  Surgeon: Bernestine Amass, MD;  Location: [facility];  Service: Urology;  Laterality: N/A;  . scrotum explotion Left   . testical growth removed    . VASECTOMY      Prior to Admission medications   Medication Sig Start Date End Date Taking? Authorizing Provider  fexofenadine (ALLEGRA) 180 MG tablet Take 180 mg by mouth daily.    [provider]  ibuprofen (ADVIL,MOTRIN) 800 MG tablet Take 1 tablet (800 mg total) by mouth every 8 (eight) hours as needed. 06/28/17   Joydan Gretzinger M, PA-C  naproxen (NAPROSYN) 500 MG tablet Take 1 tablet (500 mg total) by mouth 2 (two) times daily as needed. Take with food 11/17/16   Lilia Argue R, DO  oxyCODONE-acetaminophen (ROXICET) 5-325 MG tablet Take 1 tablet by mouth every 6 (six) hours as needed for severe pain. 06/28/17   Khanh Cordner M, PA-C    Allergies Patient has no known allergies.  Family History  Problem Relation Age of Onset  . Cancer Maternal Aunt        colon  . Cancer Paternal Uncle        prostate, 2 uncles  . Cancer Daughter        angiosarcoma age 55  . Prostate cancer Unknown     Social History Social History  Substance Use Topics  . Smoking status: Never Smoker  . Smokeless tobacco: Never Used  .  Alcohol use No    Review of Systems Constitutional: Negative for fever/chills Eyes: No visual changes. ENT:  Negative for sore throat and for difficulty swallowing Cardiovascular: Denies chest pain. Respiratory: Denies cough. Denies shortness of breath. Gastrointestinal: No abdominal pain.  No nausea, vomiting, diarrhea. Genitourinary: Negative for dysuria. Musculoskeletal: Positive for right hip pain.  Skin: Negative for rash. Neurological: Negative for headaches. Significant difficulty with ambulation.  ____________________________________________   PHYSICAL EXAM:  VITAL SIGNS: ED Triage Vitals  Enc Vitals Group     BP 06/28/17 1145 (!)  160/93     Pulse Rate 06/28/17 1145 82     Resp 06/28/17 1145 18     Temp 06/28/17 1145 98.5 F (36.9 C)     Temp Source 06/28/17 1145 Oral     SpO2 06/28/17 1145 97 %     Weight 06/28/17 1145 (!) 350 lb (158.8 kg)     Height 06/28/17 1145 5\' 11"  (1.803 m)     Head Circumference --      Peak Flow --      Pain Score 06/28/17 1141 9     Pain Loc --      Pain Edu? --      Excl. in [place]? --     Constitutional: Alert and oriented. Well appearing and in no acute distress.  Eyes: Conjunctivae are normal. PERRL. EOMI  Head: Normocephalic and atraumatic. ENT:      Ears: Canals clear. TMs intact bilaterally.      Nose: No congestion/rhinnorhea.      Mouth/Throat: Mucous membranes are moist.  Neck:Supple. No thyromegaly. No stridor.  Cardiovascular: Normal rate, regular rhythm. Normal S1 and S2.  Good peripheral circulation. Respiratory: Normal respiratory effort without tachypnea or retractions. Lungs CTAB. No wheezes/rales/rhonchi.  Hematological/Lymphatic/Immunological: No cervical lymphadenopathy. Cardiovascular: Normal rate, regular rhythm. Normal distal pulses. Gastrointestinal: Bowel sounds 4 quadrants. Soft and nontender to palpation.  Musculoskeletal: Right hip pain that increases with range of motion, weight-bearing and walking. Intact sensation of the right lower extremity. Otherwise, nontender with normal range of motion and strength in all extremities. Neurologic: Normal speech and language.  Skin:  Skin is warm, dry and intact. No rash noted. Psychiatric: Mood and affect are normal. Speech and behavior are normal. Patient exhibits appropriate insight and judgement.  ____________________________________________   LABS (all labs ordered are listed, but only abnormal results are displayed)  Labs Reviewed - No data to display ____________________________________________  EKG none ____________________________________________  RADIOLOGY DG hip unilateral with pelvis  right FINDINGS: AP view pelvis and AP/frog leg views of the right hip. AP view of the pelvis demonstrates radiation seeds in the prostate. Sacroiliac joints are symmetric. Femoral heads are located. Remote trauma involving the right superior pubic ramus.  AP and lateral views of the right hip demonstrate osseous irregularity about the lateral right acetabulum and femoral head/ neck junction. There is also cortical irregularity involving the proximal medial femoral shaft. The lateral view is suboptimal secondary to patient size.  IMPRESSION: Extensive remote posttraumatic deformities involving the right hemipelvis and hip. No convincing evidence of acute superimposed injury, given limitations of lateral view.  No findings to suggest osseous metastasis.  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 Heterotopic ossification along the upper portion of the right superior pubic ramus. The spurring of the right lateral acetabulum.  There is some mild spurring along the femoral neck no hip effusion. Articular cartilage reasonably well-maintained.  Ligaments  Suboptimally  assessed by CT.  Muscles and Tendons  Heterotopic ossification along the rectus femoris muscle as noted above. There is some faint calcifications in the proximal hamstring tendon.  Soft tissues  Prostate seed implants. Indirect right inguinal hernia contains adipose tissue.  There is an unusual soft tissue density anterior to the urinary bladder. This is associated with an almost diverticulum like outpouching of the right anterior urinary bladder. The density in question measures 2.7 by 1.3 by the 1.2 cm the and has higher density than the urinary bladder at 54 Hounsfield units.  IMPRESSION: 1. No fracture identified. 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 2. Soft tissue density anterior to the urinary bladder measuring up to 2.7 cm in long axis. Urachal mass not excluded. An unusual bladder diverticulum containing high density material is a differential diagnostic consideration given the extension from the anterior urinary bladder margin. I do not have any prior cross-sectional imaging for comparison. Considered dedicated CT of the pelvis with and without contrast to assess this lesion for enhancement and better characterize its relationship of the urinary bladder. 3. Prostate seed implants. 4. Indirect right inguinal hernia.  ____________________________________________   PROCEDURES  Procedure(s) performed: no    Critical Care performed: no ____________________________________________   INITIAL IMPRESSION / ASSESSMENT AND PLAN / ED COURSE  Pertinent labs & imaging results that were available during my care of the patient were reviewed by me and considered in my medical decision making (see chart for details).   Patient presented with right hip pain after a mechanical fall.  Patient history, physical exam findings and imaging are reassuring of no acute fracture or neurovascular injury. Symptoms consistent with right hip contusion and strain/sprain. Patient reported reduced pain with percocet given during course of care in the emergency department. Patient will be given a walker to assist with mobility until symptoms improve. Patient will be given course of percocet for continued pain management and encouraged to transition to NSAIDS as symptoms improve. Patient advised to follow up with his Orthopedist for continued care and was also advised to return to the emergency department for symptoms that change or worsen. Patient informed of clinical course, understand medical decision-making process, and agree with plan.  ____________________________________________   FINAL CLINICAL IMPRESSION(S) / ED  DIAGNOSES  Final diagnoses:  Hip pain  Fall, initial encounter  Acute right hip pain  Contusion of hip, unspecified laterality, initial encounter  Hip sprain, right, initial encounter       NEW MEDICATIONS STARTED DURING THIS VISIT:  Discharge Medication List as of 06/28/2017  5:18 PM    START taking these medications   Details  ibuprofen (ADVIL,MOTRIN) 800 MG tablet Take 1 tablet (800 mg total) by mouth every 8 (eight) hours as needed., Starting Sun 06/28/2017, Print    oxyCODONE-acetaminophen (ROXICET) 5-325 MG tablet Take 1 tablet by mouth every 6 (six) hours as needed for severe pain., Starting Sun 06/28/2017, Print         Note:  This document was prepared using Dragon voice recognition software and may include unintentional dictation errors.    Jerolyn Shin, PA-C 06/29/17 1046    Nena Polio, MD 06/29/17 [phone]

## 2017-06-28 NOTE — ED Notes (Signed)
See triage note states he slipped on deck and fell backwards  Hit head  Hematoma noted to back of head  No loc also having some pain to right groin area

## 2017-08-10 DIAGNOSIS — G4733 Obstructive sleep apnea (adult) (pediatric): Secondary | ICD-10-CM | POA: Diagnosis not present

## 2018-01-18 DIAGNOSIS — Z8546 Personal history of malignant neoplasm of prostate: Secondary | ICD-10-CM | POA: Diagnosis not present

## 2018-01-25 DIAGNOSIS — C61 Malignant neoplasm of prostate: Secondary | ICD-10-CM | POA: Diagnosis not present

## 2018-02-01 DIAGNOSIS — Z125 Encounter for screening for malignant neoplasm of prostate: Secondary | ICD-10-CM | POA: Diagnosis not present

## 2018-02-01 DIAGNOSIS — Z0000 Encounter for general adult medical examination without abnormal findings: Secondary | ICD-10-CM | POA: Diagnosis not present

## 2018-02-08 DIAGNOSIS — Z0001 Encounter for general adult medical examination with abnormal findings: Secondary | ICD-10-CM | POA: Diagnosis not present

## 2018-02-22 DIAGNOSIS — C61 Malignant neoplasm of prostate: Secondary | ICD-10-CM | POA: Diagnosis not present

## 2018-02-22 DIAGNOSIS — N329 Bladder disorder, unspecified: Secondary | ICD-10-CM | POA: Diagnosis not present

## 2018-03-05 ENCOUNTER — Other Ambulatory Visit: Payer: Self-pay

## 2018-03-05 DIAGNOSIS — Z8546 Personal history of malignant neoplasm of prostate: Secondary | ICD-10-CM | POA: Diagnosis not present

## 2018-03-05 DIAGNOSIS — Z79899 Other long term (current) drug therapy: Secondary | ICD-10-CM | POA: Diagnosis not present

## 2018-03-05 DIAGNOSIS — R0789 Other chest pain: Secondary | ICD-10-CM | POA: Insufficient documentation

## 2018-03-05 DIAGNOSIS — R079 Chest pain, unspecified: Secondary | ICD-10-CM | POA: Diagnosis not present

## 2018-03-05 DIAGNOSIS — J45909 Unspecified asthma, uncomplicated: Secondary | ICD-10-CM | POA: Diagnosis not present

## 2018-03-05 NOTE — ED Triage Notes (Signed)
Patient to ED for sharp central chest pain that lasted two seconds. States it happened two more times, also lasting two seconds each. States he got very anxious about it so called EMS. EMS told him "it could go either way." He chose to drive himself.

## 2018-03-06 ENCOUNTER — Emergency Department: Payer: 59

## 2018-03-06 ENCOUNTER — Emergency Department
Admission: EM | Admit: 2018-03-06 | Discharge: 2018-03-06 | Disposition: A | Discharge status: Home / Self Care | Payer: 59 | Attending: Emergency Medicine | Admitting: Emergency Medicine

## 2018-03-06 DIAGNOSIS — R079 Chest pain, unspecified: Secondary | ICD-10-CM | POA: Diagnosis not present

## 2018-03-06 DIAGNOSIS — R0789 Other chest pain: Secondary | ICD-10-CM

## 2018-03-06 LAB — CBC
HCT: 44.2 % (ref 40.0–52.0)
Hemoglobin: 15.6 g/dL (ref 13.0–18.0)
MCH: 29.4 pg (ref 26.0–34.0)
MCHC: 35.2 g/dL (ref 32.0–36.0)
MCV: 83.5 fL (ref 80.0–100.0)
Platelets: 231 10*3/uL (ref 150–440)
RBC: 5.29 MIL/uL (ref 4.40–5.90)
RDW: 14.7 % — AB (ref 11.5–14.5)
WBC: 5.5 10*3/uL (ref 3.8–10.6)

## 2018-03-06 LAB — BASIC METABOLIC PANEL
Anion gap: 6 (ref 5–15)
BUN: 19 mg/dL (ref 6–20)
CHLORIDE: 107 mmol/L (ref 101–111)
CO2: 26 mmol/L (ref 22–32)
CREATININE: 1.14 mg/dL (ref 0.61–1.24)
Calcium: 8.9 mg/dL (ref 8.9–10.3)
GFR calc Af Amer: >60 mL/min (ref >60)
GFR calc non Af Amer: >60 mL/min (ref >60)
GLUCOSE: 101 mg/dL — AB (ref 65–99)
Potassium: 3.9 mmol/L (ref 3.5–5.1)
SODIUM: 139 mmol/L (ref 135–145)

## 2018-03-06 LAB — TROPONIN I
Troponin I: <0.03 ng/mL (ref <0.03)
Troponin I: <0.03 ng/mL (ref <0.03)

## 2018-03-06 NOTE — ED Provider Notes (Signed)
[facility] Emergency Department Provider Note  ____________________________________________   First MD Initiated Contact with Patient 03/06/18 [phone]     (approximate)  I have reviewed the triage vital signs and the nursing notes.   HISTORY  Chief Complaint Chest Pain (one hour PTA)   HPI Kenneth Wiggins is a 57 y.o. male who comes to the emergency department with several episodes of sharp chest pain that began about an hour prior to arrival.  The pain is sharp lasting 2 to 3 seconds at a time in his right anterior chest.  No shortness of breath.  Nonradiating.  Nonexertional.  Non-positional.  Nothing seems to make it better or worse.  No history of DVT or pulmonary embolism.  It happened multiple times so he called 911 however when EMS arrived he decided to drive himself to the emergency department.  He does have a history of asthma.  He is able to lie completely flat.  No history of CHF.  Past Medical History:  Diagnosis Date  . Asthma as child   hx of  . Cancer [facility])    prostate - psa 3.93 on 07/06/2012  . GERD (gastroesophageal reflux disease)    hx of  . H/O hiatal hernia 15 years ago  . Prostate cancer ([place]) 08/09/12   Gleason 3+3=6, vol 20-25 gm  . Prostate cancer ([place]) 01-10-13   Seed implant to be done due to Robotic surgery aborted  . Sleep apnea    CPAP    Patient Active Problem List   Diagnosis Date Noted  . Prostate cancer [facility])     Past Surgical History:  Procedure Laterality Date  . HERNIA REPAIR  as child  . KNEE SURGERY Left as child   tore a ligament   . 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 RADIOACTIVE SEED IMPLANT N/A 01/17/2013   Procedure: RADIOACTIVE SEED IMPLANT;  Surgeon: Bernestine Amass, MD;  Location: [facility] ORS;  Service: Urology;  Laterality: N/A;  W/ MURRAY   . ROBOT ASSISTED LAPAROSCOPIC RADICAL PROSTATECTOMY N/A 11/24/2012   Procedure: Attempted ROBOTIC ASSISTED LAPAROSCOPIC RADICAL PROSTATECTOMY, Abandoned;   Surgeon: Bernestine Amass, MD;  Location: [facility] ORS;  Service: Urology;  Laterality: N/A;  . scrotum explotion Left   . testical growth removed    . VASECTOMY      Prior to Admission medications   Medication Sig Start Date End Date Taking? Authorizing Provider  fexofenadine (ALLEGRA) 180 MG tablet Take 180 mg by mouth daily.    [provider]  ibuprofen (ADVIL,MOTRIN) 800 MG tablet Take 1 tablet (800 mg total) by mouth every 8 (eight) hours as needed. 06/28/17   Little, Traci M, PA-C  naproxen (NAPROSYN) 500 MG tablet Take 1 tablet (500 mg total) by mouth 2 (two) times daily as needed. Take with food 11/17/16   Lilia Argue R, DO  oxyCODONE-acetaminophen (ROXICET) 5-325 MG tablet Take 1 tablet by mouth every 6 (six) hours as needed for severe pain. 06/28/17   Little, Traci M, PA-C    Allergies Patient has no known allergies.  Family History  Problem Relation Age of Onset  . Cancer Maternal Aunt        colon  . Cancer Paternal Uncle        prostate, 2 uncles  . Cancer Daughter        angiosarcoma age 21  . Prostate cancer Unknown     Social History Social History   Tobacco Use  .  Smoking status: Never Smoker  . Smokeless tobacco: Never Used  Substance Use Topics  . Alcohol use: No  . Drug use: No    Review of Systems Constitutional: No fever/chills Eyes: No visual changes. ENT: No sore throat. Cardiovascular: Positive for chest pain. Respiratory: Denies shortness of breath. Gastrointestinal: No abdominal pain.  No nausea, no vomiting.  No diarrhea.  No constipation. Genitourinary: Negative for dysuria. Musculoskeletal: Negative for back pain. Skin: Negative for rash. Neurological: Negative for headaches, focal weakness or numbness.   ____________________________________________   PHYSICAL EXAM:  VITAL SIGNS: ED Triage Vitals  Enc Vitals Group     BP 03/06/18 0002 (!) 167/96     Pulse Rate 03/06/18 0002 74     Resp 03/06/18 0002 18     Temp 03/06/18  0002 98.2 F (36.8 C)     Temp Source 03/06/18 0002 Oral     SpO2 03/06/18 0002 98 %     Weight 03/05/18 2357 (!) 328 lb (148.8 kg)     Height 03/05/18 2357 5\' 11"  (1.803 m)     Head Circumference --      Peak Flow --      Pain Score 03/05/18 2356 0     Pain Loc --      Pain Edu? --      Excl. in [place]? --     Constitutional: Alert and oriented x4 well-appearing nontoxic no diaphoresis speaks full clear sentences Eyes: PERRL EOMI. Head: Atraumatic. Nose: No congestion/rhinnorhea. Mouth/Throat: No trismus Neck: No stridor.   Cardiovascular: Normal rate, regular rhythm. Grossly normal heart sounds.  Good peripheral circulation. Respiratory: Normal respiratory effort.  No retractions. Lungs CTAB and moving good air Gastrointestinal: Obese soft nontender Musculoskeletal: Legs are equal in size Neurologic:  Normal speech and language. No gross focal neurologic deficits are appreciated. Skin:  Skin is warm, dry and intact. No rash noted. Psychiatric: Mood and affect are normal. Speech and behavior are normal.    ____________________________________________   DIFFERENTIAL includes but not limited to  Pericarditis, myocarditis, pulmonary embolism, aortic dissection, acute coronary syndrome ____________________________________________   LABS (all labs ordered are listed, but only abnormal results are displayed)  Labs Reviewed  BASIC METABOLIC PANEL - Abnormal; Notable for the following components:      Result Value   Glucose, Bld 101 (*)    All other components within normal limits  CBC - Abnormal; Notable for the following components:   RDW 14.7 (*)    All other components within normal limits  TROPONIN I  TROPONIN I    Lab work reviewed by me with no signs of acute ischemia x2 __________________________________________  EKG  ED ECG REPORT I, Darel Hong, the attending physician, personally viewed and interpreted this ECG.  Date: 03/05/2018 EKG Time:  Rate:  72 Rhythm: normal sinus rhythm QRS Axis: Leftward axis Intervals: First-degree AV block ST/T Wave abnormalities: normal Narrative Interpretation: no evidence of acute ischemia  ____________________________________________  RADIOLOGY  Chest x-ray reviewed by me with no acute disease ____________________________________________   PROCEDURES  Procedure(s) performed: no  Procedures  Critical Care performed: no  Observation: no ____________________________________________   INITIAL IMPRESSION / ASSESSMENT AND PLAN / ED COURSE  Pertinent labs & imaging results that were available during my care of the patient were reviewed by me and considered in my medical decision making (see chart for details).  The patient is very well-appearing with atypical chest pain.  Troponin negative x2 and EKG is nonischemic.  Patient is given  reassurance and he feels significantly improved.  We will follow-up with his primary care physician.  Discharged home in improved condition verbalized understanding agreement the plan with strict return precautions      ____________________________________________   FINAL CLINICAL IMPRESSION(S) / ED DIAGNOSES  Final diagnoses:  Atypical chest pain      NEW MEDICATIONS STARTED DURING THIS VISIT:  Discharge Medication List as of 03/06/2018  5:50 AM       Note:  This document was prepared using Dragon voice recognition software and may include unintentional dictation errors.     Darel Hong, MD 03/08/18 1434

## 2018-03-06 NOTE — Discharge Instructions (Signed)
It was a pleasure to take care of you today, and thank you for coming to our emergency department.  If you have any questions or concerns before leaving please ask the nurse to grab me and I'm more than happy to go through your aftercare instructions again.  If you were prescribed any opioid pain medication today such as Norco, Vicodin, Percocet, morphine, hydrocodone, or oxycodone please make sure you do not drive when you are taking this medication as it can alter your ability to drive safely.  If you have any concerns once you are home that you are not improving or are in fact getting worse before you can make it to your follow-up appointment, please do not hesitate to call 911 and come back for further evaluation.  Darel Hong, MD  Results for orders placed or performed during the hospital encounter of 99/24/26  Basic metabolic panel  Result Value Ref Range   Sodium 139 135 - 145 mmol/L   Potassium 3.9 3.5 - 5.1 mmol/L   Chloride 107 101 - 111 mmol/L   CO2 26 22 - 32 mmol/L   Glucose, Bld 101 (H) 65 - 99 mg/dL   BUN 19 6 - 20 mg/dL   Creatinine, Ser 1.14 0.61 - 1.24 mg/dL   Calcium 8.9 8.9 - 10.3 mg/dL   GFR calc non Af Amer >60 >60 mL/min   GFR calc Af Amer >60 >60 mL/min   Anion gap 6 5 - 15  CBC  Result Value Ref Range   WBC 5.5 3.8 - 10.6 K/uL   RBC 5.29 4.40 - 5.90 MIL/uL   Hemoglobin 15.6 13.0 - 18.0 g/dL   HCT 44.2 40.0 - 52.0 %   MCV 83.5 80.0 - 100.0 fL   MCH 29.4 26.0 - 34.0 pg   MCHC 35.2 32.0 - 36.0 g/dL   RDW 14.7 (H) 11.5 - 14.5 %   Platelets 231 150 - 440 K/uL  Troponin I  Result Value Ref Range   Troponin I <0.03 <0.03 ng/mL  Troponin I  Result Value Ref Range   Troponin I <0.03 <0.03 ng/mL   Dg Chest 2 View  Result Date: 03/06/2018 CLINICAL DATA:  Sharp central chest pain lasting 2 seconds. EXAM: CHEST - 2 VIEW COMPARISON:  12/15/2012 FINDINGS: Heart size and pulmonary vascularity are normal. Lungs are clear and expanded. No blunting of costophrenic  angles. No pneumothorax. Mediastinal contours appear intact. Callus formation around the mid distal sternum consistent with chronic process. Probable old fracture deformity or postoperative change. Mild degenerative changes in the spine. IMPRESSION: No evidence of active pulmonary disease. Callus formation in the sternum consistent with chronic process, possibly old fracture deformity or postoperative change. Electronically Signed   By: Lucienne Capers M.D.   On: 03/06/2018 00:49

## 2018-03-15 DIAGNOSIS — H40013 Open angle with borderline findings, low risk, bilateral: Secondary | ICD-10-CM | POA: Diagnosis not present

## 2018-03-15 DIAGNOSIS — C61 Malignant neoplasm of prostate: Secondary | ICD-10-CM | POA: Diagnosis not present

## 2018-03-15 DIAGNOSIS — H25013 Cortical age-related cataract, bilateral: Secondary | ICD-10-CM | POA: Diagnosis not present

## 2018-03-16 DIAGNOSIS — C61 Malignant neoplasm of prostate: Secondary | ICD-10-CM | POA: Diagnosis not present

## 2018-04-19 DIAGNOSIS — N329 Bladder disorder, unspecified: Secondary | ICD-10-CM | POA: Diagnosis not present

## 2018-08-09 DIAGNOSIS — G4733 Obstructive sleep apnea (adult) (pediatric): Secondary | ICD-10-CM | POA: Diagnosis not present

## 2018-08-09 DIAGNOSIS — R030 Elevated blood-pressure reading, without diagnosis of hypertension: Secondary | ICD-10-CM | POA: Diagnosis not present

## 2018-09-20 DIAGNOSIS — G4733 Obstructive sleep apnea (adult) (pediatric): Secondary | ICD-10-CM | POA: Diagnosis not present

## 2018-10-21 DIAGNOSIS — G4733 Obstructive sleep apnea (adult) (pediatric): Secondary | ICD-10-CM | POA: Diagnosis not present

## 2018-11-21 DIAGNOSIS — G4733 Obstructive sleep apnea (adult) (pediatric): Secondary | ICD-10-CM | POA: Diagnosis not present

## 2018-12-20 DIAGNOSIS — G4733 Obstructive sleep apnea (adult) (pediatric): Secondary | ICD-10-CM | POA: Diagnosis not present

## 2019-01-20 DIAGNOSIS — G4733 Obstructive sleep apnea (adult) (pediatric): Secondary | ICD-10-CM | POA: Diagnosis not present

## 2019-02-19 DIAGNOSIS — G4733 Obstructive sleep apnea (adult) (pediatric): Secondary | ICD-10-CM | POA: Diagnosis not present

## 2020-07-06 ENCOUNTER — Telehealth: Payer: Self-pay | Admitting: Infectious Diseases

## 2020-07-06 NOTE — Telephone Encounter (Signed)
Called to Discuss with patient about Covid symptoms and the use of the monoclonal antibody infusion for those with mild to moderate Covid symptoms and at a high risk of hospitalization.     Pt appears to qualify for this infusion due to co-morbid conditions and/or a member of an at-risk group in accordance with the FDA Emergency Use Authorization.    Unable to reach pt   Moderna shots March 1 and second April 1  Tuesday 1 week ago he started feeling congestion in chest and felt it was a cold. All up in his head now but overall improving.  Duration of illness, vaccination status and very mild improving disease he is not a candidate to receive monoclonal therapy.  He agreed after hearing more about the treatment and has no questions.   Janene Madeira, MSN, NP-C [facility] for Infectious Disease [email or web address] Pager: [phone] Office: [place]: [phone]

## 2021-01-04 NOTE — Progress Notes (Signed)
Subjective:    I'm seeing this patient as a consultation for:  Dr. Shelia Media. Note will be routed back to referring provider/PCP.  CC: R shoulder pain  I, Kenneth Wiggins, LAT, ATC, am serving as scribe for Dr. Lynne Leader.  HPI: Pt is a 60 y/o male presenting w/ c/o chronic R shoulder pain that has worsened over the last 1.5 years.  He locates his pain to his R superior shoulder.  Neck pain: No Radiating pain: No R shoulder mechanical symptoms: yes intermittently Aggravating factors: reaching w/ his R arm; R shoulder aBd;  Treatments tried: Nothing  Diagnostic testing: R shoulder XR-   R thumb and index finger: Constant numbness noted in the volar aspect of his R thumb and distal index finger.  He denies any pain in these fingers.  He denies any neck pain but does report increased numbness at night when sleeping and when he wakes up in the morning.  Past medical history, Surgical history, Family history, Social history, Allergies, and medications have been entered into the medical record, reviewed.   Review of Systems: No new headache, visual changes, nausea, vomiting, diarrhea, constipation, dizziness, abdominal pain, skin rash, fevers, chills, night sweats, weight loss, swollen lymph nodes, body aches, joint swelling, muscle aches, chest pain, shortness of breath, mood changes, visual or auditory hallucinations.   Objective:    Vitals:   01/07/21 0819  BP: 110/72  Pulse: 88  SpO2: 96%   General: Well Developed, well nourished, and in no acute distress.  Neuro/Psych: Alert and oriented x3, extra-ocular muscles intact, able to move all 4 extremities, sensation grossly intact. Skin: Warm and dry, no rashes noted.  Respiratory: Not using accessory muscles, speaking in full sentences, trachea midline.  Cardiovascular: Pulses palpable, no extremity edema. Abdomen: Does not appear distended. MSK: C-spine normal-appearing nontender normal cervical motion. Right shoulder  normal-appearing Nontender. Range of motion limited abduction and internal rotation. Strength 4/5 abduction and external rotation. Positive Hawkins and Neer's test.  Positive empty can test. Mildly positive Yergason's and speeds test.  Right wrist normal-appearing normal motion.  Positive Tinel's and Phalen's test. Normal grip strength pulses cap refill and sensation intact wrist and hand.  Lab and Radiology Results  Procedure: Real-time Ultrasound Guided Injection of right shoulder subacromial bursa Device: Philips Affiniti 50G Images permanently stored and available for review in PACS Ultrasound evaluation right shoulder reveals moderate subacromial bursitis.  Biceps tendon not very well visualized due to body habitus.  Rotator cuff tendons appear to be intact. Verbal informed consent obtained.  Discussed risks and benefits of procedure. Warned about infection bleeding damage to structures skin hypopigmentation and fat atrophy among others. Patient expresses understanding and agreement Time-out conducted.   Noted no overlying erythema, induration, or other signs of local infection.   Skin prepped in a sterile fashion.   Local anesthesia: Topical Ethyl chloride.   With sterile technique and under real time ultrasound guidance:  40 mg of Kenalog and 2 mL of Marcaine injected into subacromial bursa. Fluid seen entering the bursa.   Completed without difficulty   Pain immediately resolved suggesting accurate placement of the medication.   Advised to call if fevers/chills, erythema, induration, drainage, or persistent bleeding.   Images permanently stored and available for review in the ultrasound unit.  Impression: Technically successful ultrasound guided injection.   EXAM: RIGHT SHOULDER - 2+ VIEW  COMPARISON: None.  FINDINGS: Internal rotation external rotation images were obtained. There is no fracture or dislocation. There is mild narrowing  of the glenohumeral joint. The  acromioclavicular joint appears normal. No erosive change or intra-articular calcification. Visualized right lung is clear.  IMPRESSION: Mild narrowing glenohumeral joint. No fracture or dislocation.   Electronically Signed By: Lowella Grip III M.D. On: 12/25/2020 14:25  I, Lynne Leader, personally (independently) visualized and performed the interpretation of the images attached in this note.     Impression and Recommendations:    Assessment and Plan: 60 y.o. male with right shoulder pain due to subacromial bursitis. Plan for physical therapy referral, home exercise program.  Additionally we will proceed with subacromial injection.  Recheck back in about 6 weeks.  Paresthesias and numbness into right hand thought to be related to carpal tunnel syndrome.  Cervical radiculopathy less likely.  Plan for carpal tunnel wrist brace at bedtime.  Additionally trial of gabapentin at bedtime.  Reassess in 6 weeks.  PDMP not reviewed this encounter. Orders Placed This Encounter  Procedures  . [place] LIMITED JOINT SPACE STRUCTURES UP RIGHT(NO LINKED CHARGES)    Order Specific Question:   Reason for Exam (SYMPTOM  OR DIAGNOSIS REQUIRED)    Answer:   R shoulder pain    Order Specific Question:   Preferred imaging location?    Answer:   [place]  . Ambulatory referral to Physical Therapy    Referral Priority:   Routine    Referral Type:   Physical Medicine    Referral Reason:   Specialty Services Required    Requested Specialty:   Physical Therapy   Meds ordered this encounter  Medications  . gabapentin (NEURONTIN) 300 MG capsule    Sig: Take 1 capsule (300 mg total) by mouth at bedtime as needed (nerve pain hand).    Dispense:  90 capsule    Refill:  3    Discussed warning signs or symptoms. Please see discharge instructions. Patient expresses understanding.   The above documentation has been reviewed and is accurate and complete Lynne Leader, M.D.

## 2021-01-07 ENCOUNTER — Other Ambulatory Visit: Payer: Self-pay

## 2021-01-07 ENCOUNTER — Ambulatory Visit: Payer: Self-pay

## 2021-01-07 ENCOUNTER — Ambulatory Visit (INDEPENDENT_AMBULATORY_CARE_PROVIDER_SITE_OTHER): Payer: 59 | Admitting: Family Medicine

## 2021-01-07 ENCOUNTER — Encounter: Payer: Self-pay | Admitting: Family Medicine

## 2021-01-07 VITALS — BP 110/72 | HR 88 | Ht 71.0 in | Wt 330.4 lb

## 2021-01-07 DIAGNOSIS — M25511 Pain in right shoulder: Secondary | ICD-10-CM | POA: Diagnosis not present

## 2021-01-07 DIAGNOSIS — R202 Paresthesia of skin: Secondary | ICD-10-CM

## 2021-01-07 DIAGNOSIS — G8929 Other chronic pain: Secondary | ICD-10-CM | POA: Diagnosis not present

## 2021-01-07 DIAGNOSIS — R200 Anesthesia of skin: Secondary | ICD-10-CM

## 2021-01-07 DIAGNOSIS — G5601 Carpal tunnel syndrome, right upper limb: Secondary | ICD-10-CM

## 2021-01-07 MED ORDER — GABAPENTIN 300 MG PO CAPS: 300.0000 mg | ORAL_CAPSULE | Freq: Every evening | ORAL | 3 refills | Status: DC | PRN

## 2021-01-07 NOTE — Patient Instructions (Addendum)
Thank you for coming in today.  Call or go to the ER if you develop a large red swollen joint with extreme pain or oozing puss.   Please perform the exercise program that we have prepared for you and gone over in detail on a daily basis.  In addition to the handout you were provided you can access your program through: [email or web address]   Your unique program code is: [email or web address]   I've referred you to Physical Therapy.  Let us know if you don't hear from them in one week.  Please go to [facility] supply to get the carpal tunnel wrist brace we talked about today. You may also be able to get it from [employer].   Try the gabapentin at night as needed.   Recheck with me in 6 weeks.

## 2021-01-14 ENCOUNTER — Encounter: Payer: Self-pay | Admitting: Physical Therapy

## 2021-01-14 ENCOUNTER — Ambulatory Visit (INDEPENDENT_AMBULATORY_CARE_PROVIDER_SITE_OTHER): Payer: 59 | Admitting: Physical Therapy

## 2021-01-14 ENCOUNTER — Other Ambulatory Visit: Payer: Self-pay

## 2021-01-14 DIAGNOSIS — M6281 Muscle weakness (generalized): Secondary | ICD-10-CM | POA: Diagnosis not present

## 2021-01-14 DIAGNOSIS — G8929 Other chronic pain: Secondary | ICD-10-CM | POA: Diagnosis not present

## 2021-01-14 DIAGNOSIS — M25511 Pain in right shoulder: Secondary | ICD-10-CM | POA: Diagnosis not present

## 2021-01-14 NOTE — Therapy (Signed)
[place] [address] [place], [place], [address] Phone: [phone]   Fax:  [phone]  Physical Therapy Evaluation  Patient Details  Name: Kenneth Wiggins MRN: [id number] Date of Birth: 06-29-1961 Referring Provider (PT): Steva Colder   Encounter Date: 01/14/2021   PT End of Session - 01/14/21 0940    Visit Number 1    Number of Visits 12    Date for PT Re-Evaluation 02/25/21    Authorization Type UHC    PT Start Time [phone]    PT Stop Time 0930    PT Time Calculation (min) 43 min    Activity Tolerance Patient tolerated treatment well    Behavior During Therapy [facility] for tasks assessed/performed           Past Medical History:  Diagnosis Date  . Asthma as child   hx of  . Cancer [facility])    prostate - psa 3.93 on 07/06/2012  . GERD (gastroesophageal reflux disease)    hx of  . H/O hiatal hernia 15 years ago  . Prostate cancer ([place]) 08/09/12   Gleason 3+3=6, vol 20-25 gm  . Prostate cancer ([place]) 01-10-13   Seed implant to be done due to Robotic surgery aborted  . Sleep apnea    CPAP    Past Surgical History:  Procedure Laterality Date  . HERNIA REPAIR  as child  . KNEE SURGERY Left as child   tore a ligament   . 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 RADIOACTIVE SEED IMPLANT N/A 01/17/2013   Procedure: RADIOACTIVE SEED IMPLANT;  Surgeon: Bernestine Amass, MD;  Location: [facility];  Service: Urology;  Laterality: N/A;  W/ MURRAY   . ROBOT ASSISTED LAPAROSCOPIC RADICAL PROSTATECTOMY N/A 11/24/2012   Procedure: Attempted ROBOTIC ASSISTED LAPAROSCOPIC RADICAL PROSTATECTOMY, Abandoned;  Surgeon: Bernestine Amass, MD;  Location: [facility];  Service: Urology;  Laterality: N/A;  . scrotum explotion Left   . testical growth removed    . VASECTOMY      There were no vitals filed for this visit.    Subjective Assessment - 01/14/21 0846    Subjective He is a 60 y/o male presenting w/ c/o chronic R shoulder pain that has worsened over the  last 1.5 years.  He locates his pain to his R superior shoulder. R thumb and index finger: Constant numbness noted in the volar aspect of his R thumb and distal index finger.  He denies any pain in these fingers.  He denies any neck pain but does report increased numbness at night when sleeping and when he wakes up in the morning. he had injection to his shoulder on 01/07/21 and he states this helped for 4 days and now the pain returned. 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    Pertinent History PMH: asthma, sleep apnea, history of prostate Ca    Limitations Lifting    Diagnostic tests Imaging: "Ultrasound evaluation right shoulder reveals moderate subacromial bursitis.  Biceps tendon not very well visualized due to body habitus.  Rotator cuff tendons appear to be intact." XR Rt shoulder "Mild narrowing glenohumeral joint. No fracture or dislocation"    Patient Stated Goals reduce pain with using his arm    Currently in Pain? Yes    Pain Score 7    7 with activity with his Rt shoulder, 0 at rest   Pain Location Shoulder    Pain Orientation Right  Pain Descriptors / Indicators Sharp    Pain Type Chronic pain    Pain Radiating Towards down Rt arm    Pain Onset More than a month ago    Pain Frequency Intermittent    Aggravating Factors  using his Rt arm lifting into flexion or abd    Pain Relieving Factors only rest    Multiple Pain Sites No              [facility] PT Assessment - 01/14/21 0001      Assessment   Medical Diagnosis Chronic Rt shoulder pain    Referring Provider (PT) Georgina Snell, E    Onset Date/Surgical Date --   chronic pain for last 1.5 years   Next MD Visit 02/18/21      Precautions   Precautions None      Restrictions   Weight Bearing Restrictions No      Balance Screen   Has the patient fallen in the past 6 months No    Has the patient had a decrease in activity level because of a fear of falling?  No    Is the patient  reluctant to leave their home because of a fear of falling?  No      Prior Function   Level of Independence Independent    Vocation Full time employment    Vocation Requirements truck driver    Leisure nothing to report, he is a Theme park manager Status Within Functional Limits for tasks assessed      Observation/Other Assessments   Focus on Therapeutic Outcomes (FOTO)  64%, goal is 66%      ROM / Strength   AROM / PROM / Strength AROM;PROM;Strength      AROM   AROM Assessment Site Shoulder    Right/Left Shoulder Right    Right Shoulder Flexion --   WFL   Right Shoulder ABduction 80 Degrees    Right Shoulder Internal Rotation --   L4 reaching behind back   Right Shoulder External Rotation --   occiput reaching behind head     PROM   Overall PROM Comments WNL PROM Rt shoulder except abd limited to 130 deg and IR limited to 60 deg      Strength   Overall Strength Comments elbow and grip strength 4+/5 Rt and 5/5 Lt    Strength Assessment Site Shoulder    Right/Left Shoulder Right    Right Shoulder Flexion 4/5    Right Shoulder Extension 4+/5    Right Shoulder ABduction 3+/5    Right Shoulder Internal Rotation 5/5    Right Shoulder External Rotation 4+/5      Special Tests   Other special tests negative spulings test and no change with cervical distraciton or neck retractions so do not suspect cervical radiculopathy. Inconclusive impingment testing (+ empty can, negative cross body adduction test, negative hawkings kennedy test), mildly + biceps load test but no pain with palpation to biceps tendon, pain with labral testing but did not observe cluck or instability with this necessarily                      Objective measurements completed on examination: See above findings.       [facility] Adult PT Treatment/Exercise - 01/14/21 0001      Modalities   Modalities Cryotherapy;Electrical Stimulation      Cryotherapy   Number Minutes  Cryotherapy 15 Minutes  Cryotherapy Location Shoulder    Type of Cryotherapy Ice pack      Electrical Stimulation   Electrical Stimulation Location Rt shoulder    Electrical Stimulation Action IFC    Electrical Stimulation Parameters to tolerance 15 min with ice pack    Electrical Stimulation Goals Pain                    PT Short Term Goals - 01/14/21 0953      PT SHORT TERM GOAL #1   Title Pt will be I and compliant with HEP    Time 3    Period Weeks    Status New    Target Date 02/04/21             PT Long Term Goals - 01/14/21 0956      PT LONG TERM GOAL #1   Title Pt will improve FOTO to 66% functional    Time 6    Period Weeks    Status New    Target Date 02/25/21      PT LONG TERM GOAL #2   Title Pt will improve Rt shoulder strength to 5/5 MMT to improve funciton.    Time 6    Period Weeks    Status New      PT LONG TERM GOAL #3   Title Pt will improve Rt shoulder AROM to [facility].    Time 6    Period Weeks    Status New      PT LONG TERM GOAL #4   Title Pt will reduce overall pain to less than 3/10 with usual activity and sleeping    Time 6    Period Weeks    Status New                  Plan - 01/14/21 0941    Clinical Impression Statement Pt presents with chronic intermittent Rt shoulder pain and Rt hand CTS. He did not show signs of cervical radiculopathy today, and his impingment and labral special testing was inconclusive. He will benefit from skilled PT to address his funcitonal deficits in Rt shoulder ROM and strength, to decrease overall pain and to improve overall functional use of his Rt UE. He is wearing brace at night on his Rt hand which is helping per patient report.    Personal Factors and Comorbidities Comorbidity 2    Comorbidities asthma, sleep apnea    Examination-Activity Limitations Reach Overhead;Carry;Lift;Sleep    Examination-Participation Restrictions Driving    Stability/Clinical Decision Making  Evolving/Moderate complexity    Clinical Decision Making Moderate    Rehab Potential Good    PT Frequency 2x / week   1-2   PT Duration 6 weeks    PT Treatment/Interventions ADLs/Self Care Home Management;Cryotherapy;Electrical Stimulation;Iontophoresis 4mg /ml Dexamethasone;Moist Heat;Ultrasound;Therapeutic activities;Therapeutic exercise;Neuromuscular re-education;Manual techniques;Dry needling;Taping;Vasopneumatic Device;Spinal Manipulations;Joint Manipulations    PT Next Visit Plan review and update HEP PRN, modalaties and manual PRN    PT Home Exercise Plan Access Code: [place] and Agree with Plan of Care Patient           Patient will benefit from skilled therapeutic intervention in order to improve the following deficits and impairments:  Decreased activity tolerance,Decreased range of motion,Decreased strength,Increased edema,Postural dysfunction,Pain,Impaired UE functional use  Visit Diagnosis: Chronic right shoulder pain  Muscle weakness (generalized)     Problem List Patient Active Problem List   Diagnosis Date Noted  . Carpal tunnel syndrome on right  01/07/2021  . Prostate cancer [facility])     Silvestre Mesi 01/14/2021, 10:00 AM  [facility] Physical Therapy [address] [place], [place], [address] Phone: [phone]   Fax:  [phone]  Name: JAYVIAN ESCOE MRN: [id number] Date of Birth: Nov 07, 1960

## 2021-01-14 NOTE — Patient Instructions (Signed)
Access Code: [email or web address] URL: [email or web address] Date: 01/14/2021 Prepared by: Elsie Ra  Exercises Supine Shoulder Flexion Extension AAROM with Dowel - 2 x daily - 6 x weekly - 1-2 sets - 10 reps Supine Shoulder Abduction AAROM with Dowel - 2 x daily - 6 x weekly - 1-2 sets - 10 reps Standing Shoulder Posterior Capsule Stretch - 2 x daily - 6 x weekly - 1 sets - 10 reps - 10 sec hold Standing Sleeper Stretch at [employer] - 2 x daily - 6 x weekly - 1 sets - 10 reps - 5 sec hold Standing Shoulder Row with Anchored Resistance - 2 x daily - 6 x weekly - 2-3 sets - 10 reps Shoulder Extension with Resistance - Neutral - 2 x daily - 6 x weekly - 2-3 sets - 10 reps Shoulder External Rotation with Anchored Resistance - 2 x daily - 6 x weekly - 2-3 sets - 10 reps Shoulder Internal Rotation with Resistance - 2 x daily - 6 x weekly - 2-3 sets - 10 reps

## 2021-01-28 ENCOUNTER — Encounter: Payer: Self-pay | Admitting: Physical Therapy

## 2021-01-28 ENCOUNTER — Ambulatory Visit: Payer: 59 | Admitting: Physical Therapy

## 2021-01-28 ENCOUNTER — Other Ambulatory Visit: Payer: Self-pay

## 2021-01-28 DIAGNOSIS — G8929 Other chronic pain: Secondary | ICD-10-CM | POA: Diagnosis not present

## 2021-01-28 DIAGNOSIS — M25511 Pain in right shoulder: Secondary | ICD-10-CM | POA: Diagnosis not present

## 2021-01-28 DIAGNOSIS — M6281 Muscle weakness (generalized): Secondary | ICD-10-CM

## 2021-01-28 NOTE — Therapy (Signed)
[facility] Physical Therapy [address] [place], [place], [address] Phone: [phone]   Fax:  [phone]  Physical Therapy Treatment  Patient Details  Name: Kenneth Wiggins MRN: [id number] Date of Birth: [birth year] Referring Provider (PT): Steva Colder   Encounter Date: 01/28/2021   PT End of Session - 01/28/21 1533    Visit Number 2    Number of Visits 12    Date for PT Re-Evaluation 02/25/21    Authorization Type UHC    PT Start Time 1528    PT Stop Time 1608    PT Time Calculation (min) 40 min    Activity Tolerance Patient tolerated treatment well    Behavior During Therapy [facility] for tasks assessed/performed           Past Medical History:  Diagnosis Date  . Asthma as child   hx of  . Cancer [facility])    prostate - psa 3.93 on 07/06/2012  . GERD (gastroesophageal reflux disease)    hx of  . H/O hiatal hernia 15 years ago  . Prostate cancer ([place]) 08/09/12   Gleason 3+3=6, vol 20-25 gm  . Prostate cancer ([place]) 01-10-13   Seed implant to be done due to Robotic surgery aborted  . Sleep apnea    CPAP    Past Surgical History:  Procedure Laterality Date  . HERNIA REPAIR  as child  . KNEE SURGERY Left as child   tore a ligament   . 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 RADIOACTIVE SEED IMPLANT N/A 01/17/2013   Procedure: RADIOACTIVE SEED IMPLANT;  Surgeon: Bernestine Amass, MD;  [facility];  Service: Urology;  Laterality: N/A;  W/ MURRAY   . ROBOT ASSISTED LAPAROSCOPIC RADICAL PROSTATECTOMY N/A 11/24/2012   Procedure: Attempted ROBOTIC ASSISTED LAPAROSCOPIC RADICAL PROSTATECTOMY, Abandoned;  Surgeon: Bernestine Amass, MD;  [facility];  Service: Urology;  Laterality: N/A;  . scrotum explotion Left   . testical growth removed    . VASECTOMY      There were no vitals filed for this visit.   Subjective Assessment - 01/28/21 1534    Subjective Pt arriving to therapy today reporting no pain at rest and can increase to 8/10 with stretching  and moving in certain positions.    Pertinent History PMH: asthma, sleep apnea, history of prostate Ca    Limitations Lifting    Diagnostic tests Imaging: "Ultrasound evaluation right shoulder reveals moderate subacromial bursitis.  Biceps tendon not very well visualized due to body habitus.  Rotator cuff tendons appear to be intact." XR Rt shoulder "Mild narrowing glenohumeral joint. No fracture or dislocation"    Patient Stated Goals reduce pain with using his arm    Currently in Pain? Yes    Pain Score 8     Pain Location Shoulder    Pain Orientation Right    Pain Descriptors / Indicators Aching;Sore;Tightness    Pain Type Chronic pain    Pain Onset More than a month ago                             [facility] Adult PT Treatment/Exercise - 01/28/21 0001      Exercises   Exercises Shoulder      Shoulder Exercises: Supine   External Rotation AAROM;Right;15 reps   2 sets holding 3-5 seconds each   External Rotation Limitations 2# bar    Flexion AAROM;Both;15 reps   2 sets  Flexion Limitations 2# bar    Other Supine Exercises bench press with 2# bar 2 x 15      Shoulder Exercises: Standing   External Rotation Strengthening;Right;15 reps;Theraband   2 sets   Theraband Level (Shoulder External Rotation) Level 2 (Red)    Internal Rotation Strengthening;Right;15 reps;Theraband;Limitations   2 sets   Theraband Level (Shoulder Internal Rotation) Level 3 (Green)    Extension Both;Strengthening;15 reps;Theraband    Theraband Level (Shoulder Extension) Level 2 (Red)    Row Strengthening;Both;15 reps   2 sets   Theraband Level (Shoulder Row) Level 3 (Green)      Shoulder Exercises: Pulleys   Flexion 2 minutes    ABduction 2 minutes      Shoulder Exercises: Stretch   Corner Stretch 10 seconds;3 reps    Corner Stretch Limitations R UE only in doorway    Wall Stretch - Flexion 5 reps    Wall Stretch - Flexion Limitations 5 second holds    Table Stretch - External Rotation  5 reps   5 seconds holds     Modalities   Modalities Cryotherapy;Electrical Stimulation      Cryotherapy   Number Minutes Cryotherapy 15 Minutes    Cryotherapy Location Shoulder    Type of Cryotherapy Ice pack      Electrical Stimulation   Electrical Stimulation Location right shouder    Electrical Stimulation Action IFC    Electrical Stimulation Parameters 15 minutes, intensity to tolerance    Electrical Stimulation Goals Pain      Manual Therapy   Manual therapy comments Grade 2-3 AP GH right shoulder mobs                    PT Short Term Goals - 01/28/21 1530      PT SHORT TERM GOAL #1   Title Pt will be I and compliant with HEP    Status On-going             PT Long Term Goals - 01/28/21 1539      PT LONG TERM GOAL #1   Title Pt will improve FOTO to 66% functional    Status On-going      PT LONG TERM GOAL #2   Title Pt will improve Rt shoulder strength to 5/5 MMT to improve funciton.    Status On-going      PT LONG TERM GOAL #3   Title Pt will improve Rt shoulder AROM to [facility].    Status On-going      PT LONG TERM GOAL #4   Title Pt will reduce overall pain to less than 3/10 with usual activity and sleeping    Status On-going                 Plan - 01/28/21 1607    Clinical Impression Statement Pt presenting today with pain reported at 8/10 with stretching and certain movements and no pain at rest. Pt with pin point pain along supraspinatus. Pt tolerating exercises well with difficulty with external rotation. Pt reproting no pain at rest following exercise and E-stim/ice. Continue skilled PT to progress with strength and overall functional mobility.    Personal Factors and Comorbidities Comorbidity 2    Comorbidities asthma, sleep apnea    Examination-Activity Limitations Reach Overhead;Carry;Lift;Sleep    Examination-Participation Restrictions Driving    Stability/Clinical Decision Making Evolving/Moderate complexity    Rehab Potential  Good    PT Frequency 2x / week    PT Duration  6 weeks    PT Treatment/Interventions ADLs/Self Care Home Management;Cryotherapy;Electrical Stimulation;Iontophoresis 4mg /ml Dexamethasone;Moist Heat;Ultrasound;Therapeutic activities;Therapeutic exercise;Neuromuscular re-education;Manual techniques;Dry needling;Taping;Vasopneumatic Device;Spinal Manipulations;Joint Manipulations    PT Next Visit Plan review and update HEP PRN, modalaties and manual PRN    PT Home Exercise Plan Access Code: [place] and Agree with Plan of Care Patient           Patient will benefit from skilled therapeutic intervention in order to improve the following deficits and impairments:  Decreased activity tolerance,Decreased range of motion,Decreased strength,Increased edema,Postural dysfunction,Pain,Impaired UE functional use  Visit Diagnosis: Chronic right shoulder pain  Muscle weakness (generalized)     Problem List Patient Active Problem List   Diagnosis Date Noted  . Carpal tunnel syndrome on right 01/07/2021  . Prostate cancer [facility])     Oretha Caprice, PT, MPT 01/28/2021, 4:10 PM  [facility] Physical Therapy [address] [place], [place], [address] Phone: [phone]   Fax:  [phone]  Name: Kenneth Wiggins MRN: [id number] Date of Birth: Sep 30, 1961

## 2021-02-04 ENCOUNTER — Other Ambulatory Visit: Payer: Self-pay

## 2021-02-04 ENCOUNTER — Ambulatory Visit (INDEPENDENT_AMBULATORY_CARE_PROVIDER_SITE_OTHER): Payer: 59 | Admitting: Physical Therapy

## 2021-02-04 ENCOUNTER — Encounter: Payer: Self-pay | Admitting: Physical Therapy

## 2021-02-04 DIAGNOSIS — M25511 Pain in right shoulder: Secondary | ICD-10-CM | POA: Diagnosis not present

## 2021-02-04 DIAGNOSIS — G8929 Other chronic pain: Secondary | ICD-10-CM | POA: Diagnosis not present

## 2021-02-04 DIAGNOSIS — M6281 Muscle weakness (generalized): Secondary | ICD-10-CM | POA: Diagnosis not present

## 2021-02-04 NOTE — Therapy (Signed)
[facility] Physical Therapy [address] [place], [place], [address] Phone: [phone]   Fax:  [phone]  Physical Therapy Treatment  Patient Details  Name: RIDDIK SENNA MRN: [id number] Date of Birth: 1960-11-20 Referring Provider (PT): Steva Colder   Encounter Date: 02/04/2021   PT End of Session - 02/04/21 1113    Visit Number 3    Number of Visits 12    Date for PT Re-Evaluation 02/25/21    Authorization Type UHC    PT Start Time 1105    PT Stop Time 1145    PT Time Calculation (min) 40 min    Activity Tolerance Patient tolerated treatment well    Behavior During Therapy [facility] for tasks assessed/performed           Past Medical History:  Diagnosis Date  . Asthma as child   hx of  . Cancer [facility])    prostate - psa 3.93 on 07/06/2012  . GERD (gastroesophageal reflux disease)    hx of  . H/O hiatal hernia 15 years ago  . Prostate cancer ([place]) 08/09/12   Gleason 3+3=6, vol 20-25 gm  . Prostate cancer ([place]) 01-10-13   Seed implant to be done due to Robotic surgery aborted  . Sleep apnea    CPAP    Past Surgical History:  Procedure Laterality Date  . HERNIA REPAIR  as child  . KNEE SURGERY Left as child   tore a ligament   . 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 RADIOACTIVE SEED IMPLANT N/A 01/17/2013   Procedure: RADIOACTIVE SEED IMPLANT;  Surgeon: Bernestine Amass, MD;  Location: [facility];  Service: Urology;  Laterality: N/A;  W/ MURRAY   . ROBOT ASSISTED LAPAROSCOPIC RADICAL PROSTATECTOMY N/A 11/24/2012   Procedure: Attempted ROBOTIC ASSISTED LAPAROSCOPIC RADICAL PROSTATECTOMY, Abandoned;  Surgeon: Bernestine Amass, MD;  Location: [facility];  Service: Urology;  Laterality: N/A;  . scrotum explotion Left   . testical growth removed    . VASECTOMY      There were no vitals filed for this visit.   Subjective Assessment - 02/04/21 1108    Subjective Pt arriving today reporting no pain at rest, but pain can increase to 6-5/03 with certain  movmements.    Pertinent History PMH: asthma, sleep apnea, history of prostate Ca    Diagnostic tests Imaging: "Ultrasound evaluation right shoulder reveals moderate subacromial bursitis.  Biceps tendon not very well visualized due to body habitus.  Rotator cuff tendons appear to be intact." XR Rt shoulder "Mild narrowing glenohumeral joint. No fracture or dislocation"    Currently in Pain? No/denies    Pain Orientation Right    Pain Descriptors / Indicators Aching    Pain Type Chronic pain    Pain Onset More than a month ago                             [facility] Adult PT Treatment/Exercise - 02/04/21 0001      Exercises   Exercises Shoulder      Shoulder Exercises: Supine   External Rotation AAROM;Right;15 reps   2 sets holding 3-5 seconds each   External Rotation Limitations 3# bar    Flexion AAROM;15 reps   2 sets   Flexion Limitations 3# bar    Other Supine Exercises bench press with 3# bar 2 x 15      Shoulder Exercises: Standing   External Rotation Strengthening;Right;15 reps;Theraband  2 sets   Theraband Level (Shoulder External Rotation) Level 3 (Green)    Internal Rotation Strengthening;Right;15 reps;Theraband;Limitations   2 sets   Theraband Level (Shoulder Internal Rotation) Level 3 (Green)    Extension Both;Strengthening;15 reps;Theraband    Theraband Level (Shoulder Extension) Level 3 (Green)    Row Strengthening;Both;15 reps   2 sets   Theraband Level (Shoulder Row) Level 3 (Green)      Shoulder Exercises: ROM/Strengthening   UBE (Upper Arm Bike) 6 minutes forward and back alternating each minute      Shoulder Exercises: Stretch   Corner Stretch 10 seconds;3 reps    Corner Stretch Limitations R UE only in doorway      Modalities   Modalities Cryotherapy;Electrical Stimulation      Cryotherapy   Number Minutes Cryotherapy 10 Minutes    Cryotherapy Location Shoulder    Type of Cryotherapy Ice pack      Electrical Stimulation   Electrical  Stimulation Location right shouder    Electrical Stimulation Action IFC    Electrical Stimulation Parameters 10 minutes, intensity to tolerance    Electrical Stimulation Goals Pain      Manual Therapy   Manual therapy comments Grade 2-3 AP GH right shoulder mobs                  PT Education - 02/04/21 1109    Education Details Reviwed home exercises, stretches    Person(s) Educated Patient    Methods Explanation;Demonstration    Comprehension Verbalized understanding;Returned demonstration            PT Short Term Goals - 02/04/21 1119      PT SHORT TERM GOAL #1   Title Pt will be I and compliant with HEP    Status On-going             PT Long Term Goals - 02/04/21 1119      PT LONG TERM GOAL #1   Title Pt will improve FOTO to 66% functional    Status On-going      PT LONG TERM GOAL #2   Title Pt will improve Rt shoulder strength to 5/5 MMT to improve funciton.    Status On-going      PT LONG TERM GOAL #3   Title Pt will improve Rt shoulder AROM to [facility].    Status On-going      PT LONG TERM GOAL #4   Title Pt will reduce overall pain to less than 3/10 with usual activity and sleeping    Status On-going                 Plan - 02/04/21 1117    Clinical Impression Statement Pt presenting today with no pain at rest but pain can increase to 3/29 with certain movements. Pt tolerating all exercises well making progress with increased resistance. Still having difficulty with ER. Pt with normal tolerance to E-stim and ice at end of session, reporting no pain. Continue skilled PT.    Personal Factors and Comorbidities Comorbidity 2    Comorbidities asthma, sleep apnea    Examination-Activity Limitations Reach Overhead;Carry;Lift;Sleep    Examination-Participation Restrictions Driving    Stability/Clinical Decision Making Evolving/Moderate complexity    Rehab Potential Good    PT Frequency 2x / week    PT Duration 6 weeks    PT Treatment/Interventions  ADLs/Self Care Home Management;Cryotherapy;Electrical Stimulation;Iontophoresis 4mg /ml Dexamethasone;Moist Heat;Ultrasound;Therapeutic activities;Therapeutic exercise;Neuromuscular re-education;Manual techniques;Dry needling;Taping;Vasopneumatic Device;Spinal Manipulations;Joint Manipulations    PT Next Visit  Plan review and update HEP PRN, modalaties and manual PRN    PT Home Exercise Plan Access Code: [email or web address]    Consulted and Agree with Plan of Care Patient           Patient will benefit from skilled therapeutic intervention in order to improve the following deficits and impairments:  Decreased activity tolerance,Decreased range of motion,Decreased strength,Increased edema,Postural dysfunction,Pain,Impaired UE functional use  Visit Diagnosis: Chronic right shoulder pain  Muscle weakness (generalized)     Problem List Patient Active Problem List   Diagnosis Date Noted  . Carpal tunnel syndrome on right 01/07/2021  . Prostate cancer [facility])     Oretha Caprice, PT, MPT 02/04/2021, 11:21 AM  [facility] Physical Therapy [address] [place], [place], [address] Phone: [phone]   Fax:  [phone]  Name: PRYOR GUETTLER MRN: [id number] Date of Birth: 01-10-1961

## 2021-02-11 ENCOUNTER — Other Ambulatory Visit: Payer: Self-pay

## 2021-02-11 ENCOUNTER — Encounter: Payer: Self-pay | Admitting: Physical Therapy

## 2021-02-11 ENCOUNTER — Ambulatory Visit (INDEPENDENT_AMBULATORY_CARE_PROVIDER_SITE_OTHER): Payer: 59 | Admitting: Physical Therapy

## 2021-02-11 DIAGNOSIS — G8929 Other chronic pain: Secondary | ICD-10-CM

## 2021-02-11 DIAGNOSIS — M25511 Pain in right shoulder: Secondary | ICD-10-CM

## 2021-02-11 DIAGNOSIS — M6281 Muscle weakness (generalized): Secondary | ICD-10-CM

## 2021-02-11 NOTE — Therapy (Addendum)
[facility] Physical Therapy [address] [place], [place], [address] Phone: [phone]   Fax:  [phone]  Physical Therapy Treatment Discharge   Patient Details  Name: Kenneth Wiggins MRN: [id number] Date of Birth: 07-17-1961 Referring Provider (PT): Steva Colder   Encounter Date: 02/11/2021   PT End of Session - 02/11/21 0807     Visit Number 4    Number of Visits 12    Date for PT Re-Evaluation 02/25/21    Authorization Type UHC    PT Start Time 0800    PT Stop Time 0850    PT Time Calculation (min) 50 min    Activity Tolerance Patient tolerated treatment well    Behavior During Therapy [facility] for tasks assessed/performed             Past Medical History:  Diagnosis Date   Asthma as child   hx of   Cancer [facility])    prostate - psa 3.93 on 07/06/2012   GERD (gastroesophageal reflux disease)    hx of   H/O hiatal hernia 15 years ago   Prostate cancer ([place]) 08/09/12   Gleason 3+3=6, vol 20-25 gm   Prostate cancer ([place]) 01-10-13   Seed implant to be done due to Robotic surgery aborted   Sleep apnea    CPAP    Past Surgical History:  Procedure Laterality Date   HERNIA REPAIR  as child   KNEE SURGERY Left as child   tore a ligament    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   Subjective Assessment - 02/11/21 0805     Subjective Pt arriving today reporting 5-6/10 pain when moving in certain positions. Pt reporting at  rest his pain is zero. Pt reporting reaching backward is when his pain is the worse.    Pertinent History PMH: asthma, sleep apnea, history of prostate Ca    Limitations Lifting    Diagnostic tests Imaging: "Ultrasound evaluation right shoulder reveals moderate subacromial bursitis.  Biceps tendon not very well visualized due to body habitus.  Rotator cuff tendons appear to be intact." XR Rt shoulder "Mild narrowing glenohumeral joint. No fracture or dislocation"    Patient Stated Goals reduce pain with using his arm    Currently in Pain? Yes    Pain Score 6     Pain Location Shoulder    Pain Orientation Right    Pain Descriptors / Indicators Aching    Pain Type Chronic pain    Pain Onset More than a month ago                               [facility] Adult PT Treatment/Exercise - 02/11/21 0001       Exercises   Exercises Shoulder      Shoulder Exercises: Supine   External Rotation AAROM;Right;15 reps   2 sets holding 3-5 seconds each  External Rotation Limitations 3# bar    Flexion AAROM;15 reps   2 sets   Flexion Limitations 3# bar      Shoulder Exercises: Sidelying   ABduction 10 reps;AROM;Limitations   working on eccentric control     Shoulder Exercises: ROM/Strengthening   UBE (Upper Arm Bike) 6 minutes forward and back alternating each minute      Shoulder Exercises: IT sales professional 10 seconds;3 reps    Corner Stretch Limitations R UE only in doorway      Shoulder Exercises: Power Development worker, community 10 reps;Limitations    Extension Limitations BATCA: 2 sets 5#    Row 15 reps;Limitations    Row Limitations BATCA: 15# 2 sets    External Rotation 10 reps;Limitations    External Rotation Limitations BATCA: 2 sets 5#    Internal Rotation 10 reps;Limitations    Internal Rotation Limitations BATCA: 2 sets 5#    Other Power Building services engineer: lat pull down: 15# 2x 15      Modalities   Modalities Cryotherapy;Electrical Stimulation       Cryotherapy   Number Minutes Cryotherapy 10 Minutes    Cryotherapy Location Shoulder    Type of Cryotherapy Ice pack      Electrical Stimulation   Electrical Stimulation Location right shouder    Electrical Stimulation Action IFC    Electrical Stimulation Parameters 10 minutes intensity to tolerance    Electrical Stimulation Goals Pain      Manual Therapy   Manual therapy comments Grade 2-3 AP GH right shoulder mobs                      PT Short Term Goals - 02/11/21 0808       PT SHORT TERM GOAL #1   Title Pt will be I and compliant with HEP    Status On-going               PT Long Term Goals - 02/11/21 2482       PT LONG TERM GOAL #1   Title Pt will improve FOTO to 66% functional    Status On-going      PT LONG TERM GOAL #2   Title Pt will improve Rt shoulder strength to 5/5 MMT to improve funciton.    Status On-going      PT LONG TERM GOAL #3   Title Pt will improve Rt shoulder AROM to [facility].    Status On-going      PT LONG TERM GOAL #4   Title Pt will reduce overall pain to less than 3/10 with usual activity and sleeping    Status On-going                   Plan - 02/11/21 5003     Clinical Impression Statement Pt preenting today reporting no pain at rest and pain with abdution and extension of 5-6/10. Pt tolerating exercises and progressing strengthening. Pt with improvements in AROM. Ending session with normal response to E-stim and ice. NO pain reported at end of session. Continue skilled PT.    Personal Factors and Comorbidities Comorbidity 2    Comorbidities asthma, sleep apnea    Examination-Activity Limitations Reach Overhead;Carry;Lift;Sleep    Examination-Participation Restrictions Driving    Stability/Clinical Decision Making Evolving/Moderate complexity    Rehab Potential Good    PT Frequency 2x / week    PT Duration 6 weeks    PT Treatment/Interventions ADLs/Self Care  Home Management;Cryotherapy;Electrical  Stimulation;Iontophoresis 75m/ml Dexamethasone;Moist Heat;Ultrasound;Therapeutic activities;Therapeutic exercise;Neuromuscular re-education;Manual techniques;Dry needling;Taping;Vasopneumatic Device;Spinal Manipulations;Joint Manipulations    PT Next Visit Plan review and update HEP PRN, modalaties and manual PRN    PT Home Exercise Plan Access Code: [email or web address]   Consulted and Agree with Plan of Care Patient             Patient will benefit from skilled therapeutic intervention in order to improve the following deficits and impairments:  Decreased activity tolerance,Decreased range of motion,Decreased strength,Increased edema,Postural dysfunction,Pain,Impaired UE functional use  Visit Diagnosis: Chronic right shoulder pain  Muscle weakness (generalized)   PHYSICAL THERAPY DISCHARGE SUMMARY  Visits from Start of Care: 4 of 12  Current functional level related to goals / functional outcomes: See above   Remaining deficits: See above   Education / Equipment: HEP   Patient agrees to discharge. Patient goals were not met. Patient is being discharged due to not returning since the last visit.   Problem List Patient Active Problem List   Diagnosis Date Noted   Carpal tunnel syndrome on right 01/07/2021   Prostate cancer ([facility]     JOretha Caprice PT, MPT 02/11/2021, 8:42 AM  [facility] [address] [address]Phone: [phone]  Fax:  [phone] Name: Kenneth THOMAMRN: [id number] of Birth: [birth year] JKearney Hard PT, MPT 03/26/21 1:21 PM

## 2021-02-15 NOTE — Progress Notes (Signed)
I, Wendy Poet, LAT, ATC, am serving as scribe for Dr. Lynne Leader.  Kenneth Wiggins is a 60 y.o. male who presents to [place] at [facility] today for f/u of chronic R shoulder pain and R hand paresthesias.  He was last seen by Dr. Georgina Snell on 01/07/21 and had a R subacromial injection and was provided a HEP.  He was also referred to PT of which he's completed 4 sessions.  He was advised to purchase a carpal tunnel wrist brace to use at night for the R hand paresthesias.  He was also prescribed Gabapentin.  Since his last visit, pt reports pain is about the same. Pt reports pain with horz ABD and shoulder flex. Pt reports weakness when raising arm and is having to support arm w/ pinky. Pt states wrist splint at helpful at night. Pt states R thumb and index finger remains numbness and decreased grip strength. Pt notes gabapentin cause extended drowsiness and he has been unable to take the rx.   He has an upcoming lipoma removal from his right anterior chest wall on May 26.   Pertinent review of systems: No fevers or chills  Relevant historical information: History of prostate cancer   Exam:  BP 132/84 (BP Location: Right Arm, Patient Position: Sitting, Cuff Size: Normal)   Pulse 85   Ht 5\' 11"  (1.803 m)   Wt (!) 335 lb 12.8 oz (152.3 kg)   SpO2 97%   BMI 46.83 kg/m  General: Well Developed, well nourished, and in no acute distress.   MSK: Right shoulder normal motion pain with abduction and extension. Positive Hawkins and Neer's test.  Right wrist normal-appearing positive Tinel's and Phalen's test carpal tunnel.    Lab and Radiology Results  EXAM: RIGHT SHOULDER - 2+ VIEW  COMPARISON: None.  FINDINGS: Internal rotation external rotation images were obtained. There is no fracture or dislocation. There is mild narrowing of the glenohumeral joint. The acromioclavicular joint appears normal. No erosive change or intra-articular calcification. Visualized  right lung is clear.  IMPRESSION: Mild narrowing glenohumeral joint. No fracture or dislocation.   Electronically Signed By: Lowella Grip III M.D. On: 12/25/2020 14:25 I, Lynne Leader, personally (independently) visualized and performed the interpretation of the images attached in this note.      Assessment and Plan: 60 y.o. male with  Right shoulder pain failing conservative management.  Conservative management started March 15 with Dr. Shelia Media his primary care provider and is bothersome and interfering with his ability to do his job normally.  At this point he has had trial of subacromial injection and PT which helped only a little.  The injection only lasted for 4 days.  At this point plan for MRI to further characterize cause of pain and for potential surgical planning.  Recheck after MRI.  Right hand paresthesia thought to be related to carpal tunnel syndrome.  Again night splint helped did not provide lasting benefit.  He is also failing conservative management for this.  Plan for nerve conduction study to further characterize exact cause of symptoms and confirm diagnosis and plan for surgery.  If nerve conduction study cannot be done in a timely manner patient will let me know and we can get it done at the orthopedic office.   PDMP not reviewed this encounter. Orders Placed This Encounter  Procedures  . MR SHOULDER RIGHT WO CONTRAST    Standing Status:   Future    Standing Expiration Date:   02/18/2022  Order Specific Question:   What is the patient's sedation requirement?    Answer:   No Sedation    Order Specific Question:   Does the patient have a pacemaker or implanted devices?    Answer:   No    Order Specific Question:   Preferred imaging location?    Answer:   [facility] (table limit-550lbs)  . Ambulatory referral to Neurology    Referral Priority:   Routine    Referral Type:   Consultation    Referral Reason:   Specialty Services Required    Requested  Specialty:   Neurology    Number of Visits Requested:   1  . NCV with EMG(electromyography)    Standing Status:   Future    Standing Expiration Date:   02/18/2022    Order Specific Question:   Where should this test be performed?    Answer:   [facility]   No orders of the defined types were placed in this encounter.    Discussed warning signs or symptoms. Please see discharge instructions. Patient expresses understanding.   The above documentation has been reviewed and is accurate and complete Lynne Leader, M.D.

## 2021-02-18 ENCOUNTER — Ambulatory Visit (INDEPENDENT_AMBULATORY_CARE_PROVIDER_SITE_OTHER): Payer: 59 | Admitting: Family Medicine

## 2021-02-18 ENCOUNTER — Ambulatory Visit: Payer: Self-pay

## 2021-02-18 ENCOUNTER — Other Ambulatory Visit: Payer: Self-pay

## 2021-02-18 VITALS — BP 132/84 | HR 85 | Ht 71.0 in | Wt 335.8 lb

## 2021-02-18 DIAGNOSIS — M25511 Pain in right shoulder: Secondary | ICD-10-CM

## 2021-02-18 DIAGNOSIS — G8929 Other chronic pain: Secondary | ICD-10-CM | POA: Diagnosis not present

## 2021-02-18 DIAGNOSIS — G5601 Carpal tunnel syndrome, right upper limb: Secondary | ICD-10-CM

## 2021-02-18 NOTE — Patient Instructions (Signed)
Thank you for coming in today.  Plan for MRI for the shoulder to look at rotator cuff and plan for possible surgery vs other injection.   Plan for nerve conduction study in the hand to evaluate carpal tunnel syndrome and plan for surgery.   Let me know if you cannot get the nerve test done in about 1 month. I have a few backup plans.   Keep me updated.  I anticipate your MRI in a few weeks.   Recheck after that.  Ok to delay or cancel PT for now

## 2021-02-19 ENCOUNTER — Telehealth: Payer: Self-pay

## 2021-02-19 DIAGNOSIS — R200 Anesthesia of skin: Secondary | ICD-10-CM

## 2021-02-19 DIAGNOSIS — R202 Paresthesia of skin: Secondary | ICD-10-CM

## 2021-02-19 DIAGNOSIS — G5601 Carpal tunnel syndrome, right upper limb: Secondary | ICD-10-CM

## 2021-02-19 NOTE — Telephone Encounter (Signed)
[facility] called stating that they have changed how they do their nerve conduction studies, now they have a consultation appointment and then they schedule the test. The first opening is middle of August, we saw in the notes that if patient could not get in with in a month Dr. Tamala Julian had other ideas? Just wanting to know where to go from here.

## 2021-02-19 NOTE — Telephone Encounter (Signed)
Sent to wrong provider please advise below.

## 2021-02-20 ENCOUNTER — Encounter: Payer: Self-pay | Admitting: Neurology

## 2021-02-20 NOTE — Telephone Encounter (Signed)
We will try the other neurology group in town and if that does not work I have another backup plan.

## 2021-02-20 NOTE — Addendum Note (Signed)
Addended by: Gregor Hams on: 02/20/2021 07:35 AM   Modules accepted: Orders

## 2021-02-21 ENCOUNTER — Encounter (HOSPITAL_COMMUNITY): Payer: Self-pay

## 2021-02-25 ENCOUNTER — Other Ambulatory Visit: Payer: Self-pay

## 2021-02-25 ENCOUNTER — Encounter (HOSPITAL_COMMUNITY): Payer: Self-pay

## 2021-02-25 ENCOUNTER — Encounter (HOSPITAL_COMMUNITY)
Admission: RE | Admit: 2021-02-25 | Discharge: 2021-02-25 | Disposition: A | Discharge status: Home / Self Care | Payer: 59 | Source: Ambulatory Visit | Attending: Surgery | Admitting: Surgery

## 2021-02-25 ENCOUNTER — Ambulatory Visit: Payer: Self-pay | Admitting: Surgery

## 2021-02-25 DIAGNOSIS — Z01818 Encounter for other preprocedural examination: Secondary | ICD-10-CM | POA: Insufficient documentation

## 2021-02-25 HISTORY — DX: Essential (primary) hypertension: I10

## 2021-02-25 LAB — CBC
HCT: 42.3 % (ref 39.0–52.0)
Hemoglobin: 14.7 g/dL (ref 13.0–17.0)
MCH: 29.6 pg (ref 26.0–34.0)
MCHC: 34.8 g/dL (ref 30.0–36.0)
MCV: 85.1 fL (ref 80.0–100.0)
Platelets: 242 10*3/uL (ref 150–400)
RBC: 4.97 MIL/uL (ref 4.22–5.81)
RDW: 14.7 % (ref 11.5–15.5)
WBC: 6 10*3/uL (ref 4.0–10.5)
nRBC: 0 % (ref 0.0–0.2)

## 2021-02-25 LAB — BASIC METABOLIC PANEL
Anion gap: 8 (ref 5–15)
BUN: 18 mg/dL (ref 6–20)
CO2: 27 mmol/L (ref 22–32)
Calcium: 9.3 mg/dL (ref 8.9–10.3)
Chloride: 104 mmol/L (ref 98–111)
Creatinine, Ser: 1.3 mg/dL — ABNORMAL HIGH (ref 0.61–1.24)
GFR, Estimated: >60 mL/min (ref >60)
Glucose, Bld: 146 mg/dL — ABNORMAL HIGH (ref 70–99)
Potassium: 3.4 mmol/L — ABNORMAL LOW (ref 3.5–5.1)
Sodium: 139 mmol/L (ref 135–145)

## 2021-02-25 NOTE — Progress Notes (Signed)
DUE TO COVID-19 ONLY ONE VISITOR IS ALLOWED TO COME WITH YOU AND STAY IN THE WAITING ROOM ONLY DURING PRE OP AND PROCEDURE DAY OF SURGERY. THE 1 VISITOR  MAY VISIT WITH YOU AFTER SURGERY IN YOUR PRIVATE ROOM DURING VISITING HOURS ONLY!  YOU NEED TO HAVE A COVID 19 TEST ON_______ @_______ , THIS TEST MUST BE DONE BEFORE SURGERY,  COVID TESTING SITE [address] [place] [address], IT IS ON THE RIGHT GOING OUT [address] APPROXIMATELY  2 MINUTES PAST [employer] ON THE RIGHT. ONCE YOUR COVID TEST IS COMPLETED,  PLEASE BEGIN THE QUARANTINE INSTRUCTIONS AS OUTLINED IN YOUR HANDOUT.                Kenneth Wiggins  02/25/2021   Your procedure is scheduled on:  03/07/21   Report to [facility] Main  Entrance   Report to admitting at     [place] AM     Call this number if you have problems the morning of surgery [phone]    Remember: Do not eat food , candy gum or mints :After Midnight. You may have clear liquids from midnight until 0430 am     CLEAR LIQUID DIET   Foods Allowed                                                                       Coffee and tea, regular and decaf                              Plain Jell-O any favor except red or purple                                            Fruit ices (not with fruit pulp)                                      Iced Popsicles                                     Carbonated beverages, regular and diet                                    Cranberry, grape and apple juices Sports drinks like Gatorade Lightly seasoned clear broth or consume(fat free) Sugar, honey syrup   _____________________________________________________________________    BRUSH YOUR TEETH MORNING OF SURGERY AND RINSE YOUR MOUTH OUT, NO CHEWING GUM CANDY OR MINTS.     Take these medicines the morning of surgery with A SIP OF WATER:  Amlodipine , claritin  DO NOT TAKE ANY DIABETIC MEDICATIONS DAY OF YOUR SURGERY                                You may not have any  metal on your body including hair pins and              piercings  Do not wear jewelry, make-up, lotions, powders or perfumes, deodorant             Do not wear nail polish on your fingernails.  Do not shave  48 hours prior to surgery.              Men may shave face and neck.   Do not bring valuables to the hospital. [place].  Contacts, dentures or bridgework may not be worn into surgery.  Leave suitcase in the car. After surgery it may be brought to your room.     Patients discharged the day of surgery will not be allowed to drive home. IF YOU ARE HAVING SURGERY AND GOING HOME THE SAME DAY, YOU MUST HAVE AN ADULT TO DRIVE YOU HOME AND BE WITH YOU FOR 24 HOURS. YOU MAY GO HOME BY TAXI OR UBER OR ORTHERWISE, BUT AN ADULT MUST ACCOMPANY YOU HOME AND STAY WITH YOU FOR 24 HOURS.  Name and phone number of your driver:  Special Instructions: N/A              Please read over the following fact sheets you were given: _____________________________________________________________________  [facility] - Preparing for Surgery Before surgery, you can play an important role.  Because skin is not sterile, your skin needs to be as free of germs as possible.  You can reduce the number of germs on your skin by washing with CHG (chlorahexidine gluconate) soap before surgery.  CHG is an antiseptic cleaner which kills germs and bonds with the skin to continue killing germs even after washing. Please DO NOT use if you have an allergy to CHG or antibacterial soaps.  If your skin becomes reddened/irritated stop using the CHG and inform your nurse when you arrive at Short Stay. Do not shave (including legs and underarms) for at least 48 hours prior to the first CHG shower.  You may shave your face/neck. Please follow these instructions carefully:  1.  Shower with CHG Soap the night before surgery and the  morning of Surgery.  2.  If you  choose to wash your hair, wash your hair first as usual with your  normal  shampoo.  3.  After you shampoo, rinse your hair and body thoroughly to remove the  shampoo.                           4.  Use CHG as you would any other liquid soap.  You can apply chg directly  to the skin and wash                       Gently with a scrungie or clean washcloth.  5.  Apply the CHG Soap to your body ONLY FROM THE NECK DOWN.   Do not use on face/ open                           Wound or open sores. Avoid contact with eyes, ears mouth and genitals (private parts).                       Wash face,  Genitals (private parts) with your normal soap.             6.  Wash thoroughly, paying special attention to the area where your surgery  will be performed.  7.  Thoroughly rinse your body with warm water from the neck down.  8.  DO NOT shower/wash with your normal soap after using and rinsing off  the CHG Soap.                9.  Pat yourself dry with a clean towel.            10.  Wear clean pajamas.            11.  Place clean sheets on your bed the night of your first shower and do not  sleep with pets. Day of Surgery : Do not apply any lotions/deodorants the morning of surgery.  Please wear clean clothes to the hospital/surgery center.  FAILURE TO FOLLOW THESE INSTRUCTIONS MAY RESULT IN THE CANCELLATION OF YOUR SURGERY PATIENT SIGNATURE_________________________________  NURSE SIGNATURE__________________________________  ________________________________________________________________________

## 2021-02-25 NOTE — H&P (Signed)
Mr.  Chief Complaint:  9 cm mass of the right chest wall  History of Present Illness:  Kenneth Wiggins is an 60 y.o. male who was seen in the office in early April with a large mass on his right chest.  It is out 9 cm in greatest dimension.  Informed consent was obtained regarding repair.  It started several years ago and has increased in size  Past Medical History:  Diagnosis Date  . Asthma as child   hx of  . Cancer [facility])    prostate - psa 3.93 on 07/06/2012  . GERD (gastroesophageal reflux disease)    hx of  . H/O hiatal hernia 15 years ago  . Hypertension   . Prostate cancer ([place]) 08/09/12   Gleason 3+3=6, vol 20-25 gm  . Prostate cancer ([place]) 01-10-13   Seed implant to be done due to Robotic surgery aborted  . Sleep apnea    CPAP    Past Surgical History:  Procedure Laterality Date  . HERNIA REPAIR  as child  . KNEE SURGERY Left as child   tore a ligament   . 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 RADIOACTIVE SEED IMPLANT N/A 01/17/2013   Procedure: RADIOACTIVE SEED IMPLANT;  Surgeon: Bernestine Amass, MD;  Location: [facility];  Service: Urology;  Laterality: N/A;  W/ MURRAY   . ROBOT ASSISTED LAPAROSCOPIC RADICAL PROSTATECTOMY N/A 11/24/2012   Procedure: Attempted ROBOTIC ASSISTED LAPAROSCOPIC RADICAL PROSTATECTOMY, Abandoned;  Surgeon: Bernestine Amass, MD;  Location: [facility];  Service: Urology;  Laterality: N/A;  . scrotum explotion Left   . testical growth removed    . VASECTOMY      Current Outpatient Medications  Medication Sig Dispense Refill  . amLODipine (NORVASC) 10 MG tablet Take 10 mg by mouth daily.    . chlorthalidone (HYGROTON) 25 MG tablet Take 25 mg by mouth daily.    Marland Kitchen gabapentin (NEURONTIN) 300 MG capsule Take 1 capsule (300 mg total) by mouth at bedtime as needed (nerve pain hand). (Patient not taking: No sig reported) 90 capsule 3  . loratadine (CLARITIN) 10 MG tablet Take 10 mg by mouth daily.    . Multiple Vitamins-Minerals (MULTIVITAMIN WITH  MINERALS) tablet Take 1 tablet by mouth daily.    Marland Kitchen olmesartan (BENICAR) 40 MG tablet Take 40 mg by mouth daily.     No current facility-administered medications for this visit.   Patient has no known allergies. Family History  Problem Relation Age of Onset  . Cancer Maternal Aunt        colon  . Cancer Paternal Uncle        prostate, 2 uncles  . Cancer Daughter        angiosarcoma age 58  . Prostate cancer Other    Social History:   reports that he has never smoked. He has never used smokeless tobacco. He reports that he does not drink alcohol and does not use drugs.   REVIEW OF SYSTEMS : Negative except for see problem list  Physical Exam:   There were no vitals taken for this visit. There is no height or weight on file to calculate BMI.  Gen:  WDWN AAM NAD  Neurological: Alert and oriented to person, place, and time. Motor and sensory function is grossly intact  Head: Normocephalic and atraumatic.  Eyes: Conjunctivae are normal. Pupils are equal, round, and reactive to light. No scleral icterus.  Neck: Normal range of motion. Neck supple. No tracheal deviation  or thyromegaly present.  Cardiovascular:  SR without murmurs or gallops.  No carotid bruits Breast:  No masses Respiratory: Effort normal.  No respiratory distress. No chest wall tenderness. Breath sounds normal.  No wheezes, rales or rhonchi. Chest:  Easily palpable mass on the right lower chest  Abdomen: mass on the right lower chest GU:  unremarkable Musculoskeletal: Normal range of motion. Extremities are nontender. No cyanosis, edema or clubbing noted Lymphadenopathy: No cervical, preauricular, postauricular or axillary adenopathy is present Skin: Skin is warm and dry. No rash noted. No diaphoresis. No erythema. No pallor. Pscyh: Normal mood and affect. Behavior is normal. Judgment and thought content normal.   LABORATORY RESULTS: Results for orders placed or performed during the hospital encounter of 02/25/21  (from the past 48 hour(s))  Basic metabolic panel per protocol     Status: Abnormal   Collection Time: 02/25/21  9:28 AM  Result Value Ref Range   Sodium 139 135 - 145 mmol/L   Potassium 3.4 (L) 3.5 - 5.1 mmol/L   Chloride 104 98 - 111 mmol/L   CO2 27 22 - 32 mmol/L   Glucose, Bld 146 (H) 70 - 99 mg/dL    Comment: Glucose reference range applies only to samples taken after fasting for at least 8 hours.   BUN 18 6 - 20 mg/dL   Creatinine, Ser 1.30 (H) 0.61 - 1.24 mg/dL   Calcium 9.3 8.9 - 10.3 mg/dL   GFR, Estimated >60 >60 mL/min    Comment: (NOTE) Calculated using the CKD-EPI Creatinine Equation (2021)    Anion gap 8 5 - 15    Comment: Performed at [facility], [place] [address] [place], [place] [address]  CBC per protocol     Status: None   Collection Time: 02/25/21  9:28 AM  Result Value Ref Range   WBC 6.0 4.0 - 10.5 K/uL   RBC 4.97 4.22 - 5.81 MIL/uL   Hemoglobin 14.7 13.0 - 17.0 g/dL   HCT 42.3 39.0 - 52.0 %   MCV 85.1 80.0 - 100.0 fL   MCH 29.6 26.0 - 34.0 pg   MCHC 34.8 30.0 - 36.0 g/dL   RDW 14.7 11.5 - 15.5 %   Platelets 242 150 - 400 K/uL   nRBC 0.0 0.0 - 0.2 %    Comment: Performed at [facility], [place] [address] [place], [place] [address]     RADIOLOGY RESULTS: No results found.  Problem List: Patient Active Problem List   Diagnosis Date Noted  . Carpal tunnel syndrome on right 01/07/2021  . Prostate cancer [facility])     Assessment & Plan: 9 cm mass of the right anterior chest  wall    Matt B. Hassell Done, MD, [facility] [phone] beeper [phone]  02/25/2021 4:22 PM

## 2021-02-25 NOTE — Progress Notes (Addendum)
Anesthesia Review:  PCP: DR Deland Pretty Cardiologist : Chest x-ray : EKG :02/25/21  Echo : Stress test: Cardiac Cath :  Activity level: can do a flight of stairs without difficulty  Sleep Study/ CPAP : has cpap  Fasting Blood Sugar :      / Checks Blood Sugar -- times a day:   Blood Thinner/ Instructions /Last Dose: ASA / Instructions/ Last Dose :  Wife died on 04-29-2020 from liver cancer.  No covid s/s at preop appt.  Surgery is ambulatory.  NO test ordered by surgeon. NO covid test ordered.   Orders have been requested x 3 for preop orders.

## 2021-02-25 NOTE — H&P (View-Only) (Signed)
Mr.  Chief Complaint:  9 cm mass of the right chest wall  History of Present Illness:  Kenneth Wiggins is an 60 y.o. male who was seen in the office in early April with a large mass on his right chest.  It is out 9 cm in greatest dimension.  Informed consent was obtained regarding repair.  It started several years ago and has increased in size  Past Medical History:  Diagnosis Date  . Asthma as child   hx of  . Cancer [facility])    prostate - psa 3.93 on 07/06/2012  . GERD (gastroesophageal reflux disease)    hx of  . H/O hiatal hernia 15 years ago  . Hypertension   . Prostate cancer ([place]) 08/09/12   Gleason 3+3=6, vol 20-25 gm  . Prostate cancer ([place]) 01-10-13   Seed implant to be done due to Robotic surgery aborted  . Sleep apnea    CPAP    Past Surgical History:  Procedure Laterality Date  . HERNIA REPAIR  as child  . KNEE SURGERY Left as child   tore a ligament   . 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 RADIOACTIVE SEED IMPLANT N/A 01/17/2013   Procedure: RADIOACTIVE SEED IMPLANT;  Surgeon: Kenneth Amass, MD;  [facility];  Service: Urology;  Laterality: N/A;  W/ MURRAY   . ROBOT ASSISTED LAPAROSCOPIC RADICAL PROSTATECTOMY N/A 11/24/2012   Procedure: Attempted ROBOTIC ASSISTED LAPAROSCOPIC RADICAL PROSTATECTOMY, Abandoned;  Surgeon: Kenneth Amass, MD;  [facility];  Service: Urology;  Laterality: N/A;  . scrotum explotion Left   . testical growth removed    . VASECTOMY      Current Outpatient Medications  Medication Sig Dispense Refill  . amLODipine (NORVASC) 10 MG tablet Take 10 mg by mouth daily.    . chlorthalidone (HYGROTON) 25 MG tablet Take 25 mg by mouth daily.    Marland Kitchen gabapentin (NEURONTIN) 300 MG capsule Take 1 capsule (300 mg total) by mouth at bedtime as needed (nerve pain hand). (Patient not taking: No sig reported) 90 capsule 3  . loratadine (CLARITIN) 10 MG tablet Take 10 mg by mouth daily.    . Multiple Vitamins-Minerals (MULTIVITAMIN WITH  MINERALS) tablet Take 1 tablet by mouth daily.    Marland Kitchen olmesartan (BENICAR) 40 MG tablet Take 40 mg by mouth daily.     No current facility-administered medications for this visit.   Patient has no known allergies. Family History  Problem Relation Age of Onset  . Cancer Maternal Aunt        colon  . Cancer Paternal Uncle        prostate, 2 uncles  . Cancer Daughter        angiosarcoma age 34  . Prostate cancer Other    Social History:   reports that he has never smoked. He has never used smokeless tobacco. He reports that he does not drink alcohol and does not use drugs.   REVIEW OF SYSTEMS : Negative except for see problem list  Physical Exam:   There were no vitals taken for this visit. There is no height or weight on file to calculate BMI.  Gen:  WDWN AAM NAD  Neurological: Alert and oriented to person, place, and time. Motor and sensory function is grossly intact  Head: Normocephalic and atraumatic.  Eyes: Conjunctivae are normal. Pupils are equal, round, and reactive to light. No scleral icterus.  Neck: Normal range of motion. Neck supple. No tracheal deviation  or thyromegaly present.  Cardiovascular:  SR without murmurs or gallops.  No carotid bruits Breast:  No masses Respiratory: Effort normal.  No respiratory distress. No chest wall tenderness. Breath sounds normal.  No wheezes, rales or rhonchi. Chest:  Easily palpable mass on the right lower chest  Abdomen: mass on the right lower chest GU:  unremarkable Musculoskeletal: Normal range of motion. Extremities are nontender. No cyanosis, edema or clubbing noted Lymphadenopathy: No cervical, preauricular, postauricular or axillary adenopathy is present Skin: Skin is warm and dry. No rash noted. No diaphoresis. No erythema. No pallor. Pscyh: Normal mood and affect. Behavior is normal. Judgment and thought content normal.   LABORATORY RESULTS: Results for orders placed or performed during the hospital encounter of 02/25/21  (from the past 48 hour(s))  Basic metabolic panel per protocol     Status: Abnormal   Collection Time: 02/25/21  9:28 AM  Result Value Ref Range   Sodium 139 135 - 145 mmol/L   Potassium 3.4 (L) 3.5 - 5.1 mmol/L   Chloride 104 98 - 111 mmol/L   CO2 27 22 - 32 mmol/L   Glucose, Bld 146 (H) 70 - 99 mg/dL    Comment: Glucose reference range applies only to samples taken after fasting for at least 8 hours.   BUN 18 6 - 20 mg/dL   Creatinine, Ser 1.30 (H) 0.61 - 1.24 mg/dL   Calcium 9.3 8.9 - 10.3 mg/dL   GFR, Estimated >60 >60 mL/min    Comment: (NOTE) Calculated using the CKD-EPI Creatinine Equation (2021)    Anion gap 8 5 - 15    Comment: Performed at [facility], [place] [address], [place], [place] [address]  CBC per protocol     Status: None   Collection Time: 02/25/21  9:28 AM  Result Value Ref Range   WBC 6.0 4.0 - 10.5 K/uL   RBC 4.97 4.22 - 5.81 MIL/uL   Hemoglobin 14.7 13.0 - 17.0 g/dL   HCT 42.3 39.0 - 52.0 %   MCV 85.1 80.0 - 100.0 fL   MCH 29.6 26.0 - 34.0 pg   MCHC 34.8 30.0 - 36.0 g/dL   RDW 14.7 11.5 - 15.5 %   Platelets 242 150 - 400 K/uL   nRBC 0.0 0.0 - 0.2 %    Comment: Performed at [facility], [place] [address], [place], [place] [address]     RADIOLOGY RESULTS: No results found.  Problem List: Patient Active Problem List   Diagnosis Date Noted  . Carpal tunnel syndrome on right 01/07/2021  . Prostate cancer [facility])     Assessment & Plan: 9 cm mass of the right anterior chest  wall    Matt B. Hassell Done, MD, [facility] [phone] beeper [phone]  02/25/2021 4:22 PM

## 2021-02-27 ENCOUNTER — Other Ambulatory Visit: Payer: Self-pay

## 2021-02-27 ENCOUNTER — Ambulatory Visit (INDEPENDENT_AMBULATORY_CARE_PROVIDER_SITE_OTHER): Payer: 59 | Admitting: Neurology

## 2021-02-27 DIAGNOSIS — G5601 Carpal tunnel syndrome, right upper limb: Secondary | ICD-10-CM

## 2021-02-27 NOTE — Procedures (Signed)
[facility]  [place], [place]  [place], [place] [address] Tel: [phone] Fax:  [phone] Test Date:  02/27/2021  Patient: Kenneth Wiggins DOB: 10-28-1960 Physician: Narda Amber, DO  Sex: Male Height: 5\' 11"  Ref Phys: Lynne Leader, M.D.  ID#: [id number]   Technician:    Patient Complaints: This is a 60 year old man referred for evaluation of right hand numbness and tingling, worse over the thumb.  NCV & EMG Findings: Extensive electrodiagnostic testing of the right upper extremity shows:  1. Right median sensory response is absent.  Right ulnar sensory responses within normal limits. 2. Right median motor response is absent.  Of note, there is evidence of a right Martin-Gruber anastomosis, a normal anatomic variant.  Right ulnar motor responses within normal limits. 3. Severe chronic motor axonal loss changes are seen affecting the right abductor pollicis brevis muscle, without accompanying active denervation.  Impression: 1. Right median neuropathy at or distal to the wrist, consistent with a clinical diagnosis of carpal tunnel syndrome.  Overall, these findings are very severe in degree electrically. 2. There is no evidence of a right cervical radiculopathy.   ___________________________ Narda Amber, DO    Nerve Conduction Studies Anti Sensory Summary Table   Stim Site NR Peak (ms) Norm Peak (ms) P-T Amp (V) Norm P-T Amp  Right Median Anti Sensory (2nd Digit)  33C  Wrist NR  <3.6  >15  Right Ulnar Anti Sensory (5th Digit)  33C  Wrist    3.1 <3.1 12.7 >10   Motor Summary Table   Stim Site NR Onset (ms) Norm Onset (ms) O-P Amp (mV) Norm O-P Amp Site1 Site2 Delta-0 (ms) Dist (cm) Vel (m/s) Norm Vel (m/s)  Right Median Motor (Abd Poll Brev)  33C  Wrist NR  <4.0  >6 Elbow Wrist  0.0  >50  Elbow NR     Ulnar-wrist crossover Elbow  0.0    Ulnar-wrist crossover    4.3  2.5         Right Ulnar Motor (Abd Dig Minimi)  33C  Wrist    2.7 <3.1 10.8 >7 B  Elbow Wrist 4.4 25.0 57 >50  B Elbow    7.1  10.0  A Elbow B Elbow 1.7 10.0 59 >50  A Elbow    8.8  9.4          EMG   Side Muscle Ins Act Fibs Psw Fasc Number Recrt Dur Dur. Amp Amp. Poly Poly. Comment  Right 1stDorInt Nml Nml Nml Nml Nml Nml Nml Nml Nml Nml Nml Nml N/A  Right Abd Poll Brev Nml Nml Nml Nml SMU Rapid All 1+ All 1+ All 1+ ATR  Right PronatorTeres Nml Nml Nml Nml Nml Nml Nml Nml Nml Nml Nml Nml N/A  Right Biceps Nml Nml Nml Nml Nml Nml Nml Nml Nml Nml Nml Nml N/A  Right Triceps Nml Nml Nml Nml Nml Nml Nml Nml Nml Nml Nml Nml N/A  Right Deltoid Nml Nml Nml Nml Nml Nml Nml Nml Nml Nml Nml Nml N/A      Waveforms:

## 2021-02-28 ENCOUNTER — Telehealth: Payer: Self-pay | Admitting: Family Medicine

## 2021-02-28 DIAGNOSIS — G5601 Carpal tunnel syndrome, right upper limb: Secondary | ICD-10-CM

## 2021-02-28 NOTE — Telephone Encounter (Signed)
Referral to orthopedic surgery ordered

## 2021-02-28 NOTE — Progress Notes (Signed)
Nerve conduction study shows severe carpal tunnel syndrome.  I have referred you directly to orthopedic surgery to discuss surgical evaluation.  You should hear soon about an appointment.

## 2021-03-04 ENCOUNTER — Other Ambulatory Visit (HOSPITAL_COMMUNITY): Payer: 59

## 2021-03-04 ENCOUNTER — Ambulatory Visit
Admission: RE | Admit: 2021-03-04 | Discharge: 2021-03-04 | Disposition: A | Discharge status: Home / Self Care | Payer: 59 | Source: Ambulatory Visit | Attending: Family Medicine | Admitting: Family Medicine

## 2021-03-04 DIAGNOSIS — G8929 Other chronic pain: Secondary | ICD-10-CM

## 2021-03-04 DIAGNOSIS — M25511 Pain in right shoulder: Secondary | ICD-10-CM

## 2021-03-06 NOTE — Anesthesia Preprocedure Evaluation (Addendum)
Anesthesia Evaluation  Patient identified by MRN, date of birth, ID band Patient awake    Reviewed: Allergy & Precautions, NPO status , Patient's Chart, lab work & pertinent test results  History of Anesthesia Complications Negative for: history of anesthetic complications  Airway Mallampati: I  TM Distance: >3 FB Neck ROM: Full    Dental  (+) Teeth Intact   Pulmonary sleep apnea and Continuous Positive Airway Pressure Ventilation ,    Pulmonary exam normal        Cardiovascular hypertension, Pt. on medications Normal cardiovascular exam     Neuro/Psych negative neurological ROS     GI/Hepatic Neg liver ROS, GERD  ,  Endo/Other  Morbid obesity  Renal/GU negative Renal ROSCr 1.30   H/o prostate cancer    Musculoskeletal negative musculoskeletal ROS (+)   Abdominal   Peds  Hematology negative hematology ROS (+)   Anesthesia Other Findings   Reproductive/Obstetrics                            Anesthesia Physical Anesthesia Plan  ASA: III  Anesthesia Plan: MAC   Post-op Pain Management:    Induction: Intravenous  PONV Risk Score and Plan: 1 and Propofol infusion, TIVA and Treatment may vary due to age or medical condition  Airway Management Planned: Natural Airway, Nasal Cannula and Simple Face Mask  Additional Equipment: None  Intra-op Plan:   Post-operative Plan:   Informed Consent: I have reviewed the patients History and Physical, chart, labs and discussed the procedure including the risks, benefits and alternatives for the proposed anesthesia with the patient or authorized representative who has indicated his/her understanding and acceptance.       Plan Discussed with:   Anesthesia Plan Comments:        Anesthesia Quick Evaluation

## 2021-03-06 NOTE — Progress Notes (Signed)
Right shoulder MRI shows lots of issues.  It shows 2 different partial rotator cuff tears, it shows a biceps tendon issue and a possible stress fracture in the shoulder.  Please schedule follow-up appoint with me in the near future to go over these results in full detail and discuss treatment plan and options.  It is possible that we could do the carpal tunnel surgery in the shoulder surgery at the same time so hold off on going to that orthopedic surgery appointment for your carpal tunnel at least until I can see you for the shoulder.

## 2021-03-07 ENCOUNTER — Ambulatory Visit (HOSPITAL_COMMUNITY): Payer: 59 | Admitting: Anesthesiology

## 2021-03-07 ENCOUNTER — Encounter (HOSPITAL_COMMUNITY)
Admission: RE | Disposition: A | Discharge status: Home / Self Care | Payer: Self-pay | Source: Ambulatory Visit | Attending: Surgery

## 2021-03-07 ENCOUNTER — Ambulatory Visit (HOSPITAL_COMMUNITY)
Admission: RE | Admit: 2021-03-07 | Discharge: 2021-03-07 | Disposition: A | Discharge status: Home / Self Care | Payer: 59 | Source: Ambulatory Visit | Attending: Surgery | Admitting: Surgery

## 2021-03-07 ENCOUNTER — Encounter (HOSPITAL_COMMUNITY): Payer: Self-pay | Admitting: Surgery

## 2021-03-07 DIAGNOSIS — Z79899 Other long term (current) drug therapy: Secondary | ICD-10-CM | POA: Insufficient documentation

## 2021-03-07 DIAGNOSIS — D171 Benign lipomatous neoplasm of skin and subcutaneous tissue of trunk: Secondary | ICD-10-CM | POA: Insufficient documentation

## 2021-03-07 DIAGNOSIS — R222 Localized swelling, mass and lump, trunk: Secondary | ICD-10-CM | POA: Diagnosis present

## 2021-03-07 HISTORY — PX: MASS EXCISION: SHX2000

## 2021-03-07 SURGERY — EXCISION MASS
Anesthesia: General | Laterality: Right

## 2021-03-07 MED ORDER — LIDOCAINE 2% (20 MG/ML) 5 ML SYRINGE
INTRAMUSCULAR | Status: DC | PRN
  Administered 2021-03-07: 100 mg via INTRAVENOUS

## 2021-03-07 MED ORDER — 0.9 % SODIUM CHLORIDE (POUR BTL) OPTIME
TOPICAL | Status: DC | PRN
  Administered 2021-03-07: 1000 mL

## 2021-03-07 MED ORDER — OXYCODONE HCL 5 MG/5ML PO SOLN: 5.0000 mg | Freq: Once | ORAL | Status: DC | PRN

## 2021-03-07 MED ORDER — PROPOFOL 1000 MG/100ML IV EMUL
INTRAVENOUS | Status: AC
  Filled 2021-03-07: qty 100

## 2021-03-07 MED ORDER — FENTANYL CITRATE (PF) 100 MCG/2ML IJ SOLN
INTRAMUSCULAR | Status: AC
  Filled 2021-03-07: qty 2

## 2021-03-07 MED ORDER — GLYCOPYRROLATE 0.2 MG/ML IJ SOLN
INTRAMUSCULAR | Status: DC | PRN
  Administered 2021-03-07: .2 mg via INTRAVENOUS

## 2021-03-07 MED ORDER — CEFAZOLIN IN SODIUM CHLORIDE 3-0.9 GM/100ML-% IV SOLN
3.0000 g | INTRAVENOUS | Status: AC
  Administered 2021-03-07: 3 g via INTRAVENOUS
  Filled 2021-03-07: qty 100

## 2021-03-07 MED ORDER — MIDAZOLAM HCL 5 MG/5ML IJ SOLN
INTRAMUSCULAR | Status: DC | PRN
  Administered 2021-03-07: 2 mg via INTRAVENOUS

## 2021-03-07 MED ORDER — ONDANSETRON HCL 4 MG/2ML IJ SOLN
INTRAMUSCULAR | Status: DC | PRN
  Administered 2021-03-07: 4 mg via INTRAVENOUS

## 2021-03-07 MED ORDER — PROPOFOL 10 MG/ML IV BOLUS
INTRAVENOUS | Status: DC | PRN
  Administered 2021-03-07: 200 mg via INTRAVENOUS
  Administered 2021-03-07: 20 mg via INTRAVENOUS

## 2021-03-07 MED ORDER — LIDOCAINE 2% (20 MG/ML) 5 ML SYRINGE
INTRAMUSCULAR | Status: AC
  Filled 2021-03-07: qty 5

## 2021-03-07 MED ORDER — BUPIVACAINE-EPINEPHRINE 0.25% -1:200000 IJ SOLN
INTRAMUSCULAR | Status: DC | PRN
  Administered 2021-03-07: 30 mL

## 2021-03-07 MED ORDER — HYDROCODONE-ACETAMINOPHEN 5-325 MG PO TABS: 1.0000 | ORAL_TABLET | Freq: Four times a day (QID) | ORAL | 0 refills | Status: DC | PRN

## 2021-03-07 MED ORDER — CHLORHEXIDINE GLUCONATE CLOTH 2 % EX PADS: 6.0000 | MEDICATED_PAD | Freq: Once | CUTANEOUS | Status: DC

## 2021-03-07 MED ORDER — ONDANSETRON HCL 4 MG/2ML IJ SOLN
INTRAMUSCULAR | Status: AC
  Filled 2021-03-07: qty 2

## 2021-03-07 MED ORDER — PROPOFOL 10 MG/ML IV BOLUS
INTRAVENOUS | Status: AC
  Filled 2021-03-07: qty 20

## 2021-03-07 MED ORDER — PROPOFOL 500 MG/50ML IV EMUL
INTRAVENOUS | Status: DC | PRN
  Administered 2021-03-07: 100 ug/kg/min via INTRAVENOUS

## 2021-03-07 MED ORDER — BUPIVACAINE-EPINEPHRINE (PF) 0.25% -1:200000 IJ SOLN
INTRAMUSCULAR | Status: AC
  Filled 2021-03-07: qty 30

## 2021-03-07 MED ORDER — CHLORHEXIDINE GLUCONATE 0.12 % MT SOLN: 15.0000 mL | Freq: Once | OROMUCOSAL | Status: AC

## 2021-03-07 MED ORDER — ONDANSETRON HCL 4 MG/2ML IJ SOLN: 4.0000 mg | Freq: Once | INTRAMUSCULAR | Status: DC | PRN

## 2021-03-07 MED ORDER — FENTANYL CITRATE (PF) 100 MCG/2ML IJ SOLN
INTRAMUSCULAR | Status: DC | PRN
  Administered 2021-03-07: 100 ug via INTRAVENOUS

## 2021-03-07 MED ORDER — OXYCODONE HCL 5 MG PO TABS: 5.0000 mg | ORAL_TABLET | Freq: Once | ORAL | Status: DC | PRN

## 2021-03-07 MED ORDER — ORAL CARE MOUTH RINSE
15.0000 mL | Freq: Once | OROMUCOSAL | Status: AC
  Administered 2021-03-07: 15 mL via OROMUCOSAL

## 2021-03-07 MED ORDER — FENTANYL CITRATE (PF) 100 MCG/2ML IJ SOLN: 25.0000 ug | INTRAMUSCULAR | Status: DC | PRN

## 2021-03-07 MED ORDER — AMISULPRIDE (ANTIEMETIC) 5 MG/2ML IV SOLN: 10.0000 mg | Freq: Once | INTRAVENOUS | Status: DC | PRN

## 2021-03-07 MED ORDER — MIDAZOLAM HCL 2 MG/2ML IJ SOLN
INTRAMUSCULAR | Status: AC
  Filled 2021-03-07: qty 2

## 2021-03-07 MED ORDER — LACTATED RINGERS IV SOLN: INTRAVENOUS | Status: DC

## 2021-03-07 SURGICAL SUPPLY — 30 items
ADH SKN CLS APL DERMABOND .7 (GAUZE/BANDAGES/DRESSINGS) ×1
APL SKNCLS STERI-STRIP NONHPOA (GAUZE/BANDAGES/DRESSINGS)
BENZOIN TINCTURE PRP APPL 2/3 (GAUZE/BANDAGES/DRESSINGS) IMPLANT
COVER WAND RF STERILE (DRAPES) IMPLANT
DECANTER SPIKE VIAL GLASS SM (MISCELLANEOUS) IMPLANT
DERMABOND ADVANCED (GAUZE/BANDAGES/DRESSINGS) ×1
DERMABOND ADVANCED .7 DNX12 (GAUZE/BANDAGES/DRESSINGS) IMPLANT
DRAPE LAPAROSCOPIC ABDOMINAL (DRAPES) IMPLANT
DRAPE LAPAROTOMY T 102X78X121 (DRAPES) IMPLANT
DRAPE LAPAROTOMY TRNSV 102X78 (DRAPES) ×2 IMPLANT
DRAPE UTILITY XL STRL (DRAPES) ×2 IMPLANT
ELECT REM PT RETURN 15FT ADLT (MISCELLANEOUS) ×2 IMPLANT
GAUZE PACKING IODOFORM 1/4X15 (PACKING) IMPLANT
GAUZE SPONGE 4X4 12PLY STRL (GAUZE/BANDAGES/DRESSINGS) ×2 IMPLANT
GLOVE SURG ENC TEXT LTX SZ8 (GLOVE) ×2 IMPLANT
GOWN STRL REUS W/TWL XL LVL3 (GOWN DISPOSABLE) ×4 IMPLANT
KIT BASIN OR (CUSTOM PROCEDURE TRAY) ×2 IMPLANT
KIT TURNOVER KIT A (KITS) ×2 IMPLANT
MARKER SKIN DUAL TIP RULER LAB (MISCELLANEOUS) ×2 IMPLANT
NEEDLE HYPO 22GX1.5 SAFETY (NEEDLE) ×2 IMPLANT
PACK GENERAL/GYN (CUSTOM PROCEDURE TRAY) ×2 IMPLANT
PAD TELFA 2X3 NADH STRL (GAUZE/BANDAGES/DRESSINGS) IMPLANT
SPONGE LAP 4X18 RFD (DISPOSABLE) IMPLANT
STAPLER VISISTAT 35W (STAPLE) IMPLANT
SUT MNCRL AB 4-0 PS2 18 (SUTURE) IMPLANT
SUT VIC AB 3-0 SH 27 (SUTURE)
SUT VIC AB 3-0 SH 27XBRD (SUTURE) IMPLANT
SYR 20ML LL LF (SYRINGE) ×2 IMPLANT
TOWEL OR 17X26 10 PK STRL BLUE (TOWEL DISPOSABLE) ×2 IMPLANT
TOWEL OR NON WOVEN STRL DISP B (DISPOSABLE) ×2 IMPLANT

## 2021-03-07 NOTE — Transfer of Care (Signed)
Immediate Anesthesia Transfer of Care Note  Patient: Kenneth Wiggins  Procedure(s) Performed: EXCISION MASS OF RIGHT CHEST (Right )  Patient Location: PACU  Anesthesia Type:General  Level of Consciousness: awake, alert  and oriented  Airway & Oxygen Therapy: Patient Spontanous Breathing and Patient connected to face mask oxygen  Post-op Assessment: Report given to RN and Post -op Vital signs reviewed and stable  Post vital signs: Reviewed and stable  Last Vitals:  Vitals Value Taken Time  BP 139/82 03/07/21 0850  Temp    Pulse 83 03/07/21 0852  Resp 10 03/07/21 0852  SpO2 100 % 03/07/21 0852  Vitals shown include unvalidated device data.  Last Pain:  Vitals:   03/07/21 0552  TempSrc: Oral         Complications: No complications documented.

## 2021-03-07 NOTE — Interval H&P Note (Signed)
History and Physical Interval Note:  03/07/2021 7:32 AM  Kenneth Wiggins  has presented today for surgery, with the diagnosis of RIGHT CHEST LIPOMA.  The various methods of treatment have been discussed with the patient and family. After consideration of risks, benefits and other options for treatment, the patient has consented to  Procedure(s): EXCISION MASS OF RIGHT CHEST (Right) as a surgical intervention.  The patient's history has been reviewed, patient examined, no change in status, stable for surgery.  I have reviewed the patient's chart and labs.  Questions were answered to the patient's satisfaction.     Pedro Earls

## 2021-03-07 NOTE — Progress Notes (Signed)
I, Wendy Poet, LAT, ATC, am serving as scribe for Dr. Lynne Leader.  DARVIN DIALS is a 60 y.o. male who presents to [place] at [facility] today for f/u chronic R shoulder pain, R hand paresthesias, and R shoulder MRI review. Of note, pt had a lipoma removal from his R anterior chest wall on 03/07/21. Pt was last seen by Dr. Georgina Snell on 02/18/21 and was advised to proceed to shoulder MRI. Pt was advised to treat R hand paresthesia w/ night splints and an NCV study was ordered and completed on 02/27/21. Based on the NCV study, pt was referred to [facility] to discuss carpal tunnel surgery.  Since his last visit, pt reports that his R shoulder pain and R hand paresthesias remain the same.  He states that he is out of work for the next 2 weeks due to his lipoma removal.  He has an appt w/ hand surgery at [facility] on June 9th.  Dx testing: 03/04/21 R shoulder MRI  02/27/21 NCV study  Pertinent review of systems: No fevers or chills  Relevant historical information: Prostate cancer history   Exam:  BP 104/68 (BP Location: Right Arm, Patient Position: Sitting, Cuff Size: Large)   Pulse 88   Ht 5\' 11"  (1.803 m)   Wt (!) 337 lb 12.8 oz (153.2 kg)   SpO2 97%   BMI 47.11 kg/m  General: Well Developed, well nourished, and in no acute distress.   MSK: No atrophy right hand.  Decreased range of motion right shoulder    Lab and Radiology Results  EXAM: MRI OF THE RIGHT SHOULDER WITHOUT CONTRAST  TECHNIQUE: Multiplanar, multisequence MR imaging of the shoulder was performed. No intravenous contrast was administered.  COMPARISON:  X-ray 12/25/2020  FINDINGS: Rotator cuff: Partial thickness articular surface avulsion of the anterior supraspinatus tendon insertion measuring approximately 10 mm in AP dimension (series 9, image 14). Subscapularis tendinosis with partial-thickness interstitial tear distally. Infraspinatus and teres minor tendons intact. No full  thickness rotator cuff tear.  Muscles: Preserved bulk and signal intensity of the rotator cuff musculature without edema, atrophy, or fatty infiltration.  Biceps long head: Intact. Biceps tendon is medially perched at the groove entry zone without dislocation.  Acromioclavicular Joint: Minimal arthropathy of the AC joint. No subacromial-subdeltoid bursal fluid.  Glenohumeral Joint: No joint effusion. No chondral defect.  Labrum: Grossly intact, but evaluation is limited by lack of intraarticular fluid. No paralabral cyst.  Bones: Bone marrow edema throughout the acromion a subtle nondisplaced low T1/T2 signal intensity linear component through the acromial base (series 6, images 6-7) compatible with developing fracture. Elsewhere, bony structures are intact. No additional fractures. No dislocation. No suspicious bone lesion.  Other: None.  IMPRESSION: 1. Findings suggestive of developing nondisplaced fracture of the acromial base, likely stress fracture. 2. Partial thickness rim rent tear of the anterior supraspinatus tendon insertion. 3. Subscapularis tendinosis with partial-thickness interstitial tear distally. 4. Medial subluxation of the long head of the biceps tendon at the groove entry zone without dislocation.   Electronically Signed   By: Davina Poke D.O.   On: 03/06/2021 12:29  I, Lynne Leader, personally (independently) visualized and performed the interpretation of the images attached in this note.   EMG dated Feb 27, 2021   Patient Complaints: This is a 60 year old man referred for evaluation of right hand numbness and tingling, worse over the thumb.  NCV & EMG Findings: Extensive electrodiagnostic testing of the right upper extremity shows:  1.  Right median sensory response is absent.  Right ulnar sensory responses within normal limits. 2. Right median motor response is absent.  Of note, there is evidence of a right Martin-Gruber  anastomosis, a normal anatomic variant.  Right ulnar motor responses within normal limits. 3. Severe chronic motor axonal loss changes are seen affecting the right abductor pollicis brevis muscle, without accompanying active denervation.  Impression: 1. Right median neuropathy at or distal to the wrist, consistent with a clinical diagnosis of carpal tunnel syndrome.  Overall, these findings are very severe in degree electrically. 2. There is no evidence of a right cervical radiculopathy.   ___________________________ Narda Amber, DO    Assessment and Plan: 60 y.o. male with  Right shoulder pain due to rotator cuff tendinopathy and partial tears and acromion stress reaction and biceps tendinopathy and subluxation.  Ultimately I do not think that conservative management will help this issue and ultimately he will require surgery.  Plan to refer to orthopedic surgery for this issue.  Carpal tunnel syndrome.  EMG shows this issue as severe.  I got this result back May 18 before the MRI results came back and referred her to hand surgery.  However I think it is likely that he will need a shoulder surgery as well.  Given that he will probably need both a carpal tunnel release and shoulder surgery I think that general orthopedic surgery could do both probably at the same time.  Plan to refer to general orthopedic surgery and likely cancel the hand surgery referral in the hopes that his general orthopedic surgeon can operate on his shoulder and his wrist at the same time.  This issue will require surgery and I do not anticipate the conservative management will help.   PDMP not reviewed this encounter. Orders Placed This Encounter  Procedures  . Ambulatory referral to Orthopedic Surgery    Referral Priority:   Routine    Referral Type:   Surgical    Referral Reason:   Specialty Services Required    Requested Specialty:   Orthopedic Surgery    Number of Visits Requested:   1   No orders of the  defined types were placed in this encounter.    Discussed warning signs or symptoms. Please see discharge instructions. Patient expresses understanding.   The above documentation has been reviewed and is accurate and complete Lynne Leader, M.D.  Total encounter time 20 minutes including face-to-face time with the patient and, reviewing past medical record, and charting on the date of service.   Reviewed MRI, nerve conduction study, and discuss treatment plan

## 2021-03-07 NOTE — Anesthesia Procedure Notes (Addendum)
Date/Time: 03/07/2021 7:42 AM Performed by: Sharlette Dense, CRNA Oxygen Delivery Method: Simple face mask

## 2021-03-07 NOTE — Discharge Instructions (Signed)
Lipoma  A lipoma is a noncancerous (benign) tumor that is made up of fat cells. This is a very common type of soft-tissue growth. Lipomas are usually found under the skin (subcutaneous). They may occur in any tissue of the body that contains fat. Common areas for lipomas to appear include the back, arms, shoulders, buttocks, and thighs. Lipomas grow slowly, and they are usually painless. Most lipomas do not cause problems and do not require treatment. What are the causes? The cause of this condition is not known. What increases the risk? You are more likely to develop this condition if:  You are 40-60 years old.  You have a family history of lipomas. What are the signs or symptoms? A lipoma usually appears as a small, round bump under the skin. In most cases, the lump will:  Feel soft or rubbery.  Not cause pain or other symptoms. However, if a lipoma is located in an area where it pushes on nerves, it can become painful or cause other symptoms. How is this diagnosed? A lipoma can usually be diagnosed with a physical exam. You may also have tests to confirm the diagnosis and to rule out other conditions. Tests may include:  Imaging tests, such as a CT scan or an MRI.  Removal of a tissue sample to be looked at under a microscope (biopsy). How is this treated? Treatment for this condition depends on the size of the lipoma and whether it is causing any symptoms.  For small lipomas that are not causing problems, no treatment is needed.  If a lipoma is bigger or it causes problems, surgery may be done to remove the lipoma. Lipomas can also be removed to improve appearance. Most often, the procedure is done after applying a medicine that numbs the area (local anesthetic).  Liposuction may be done to reduce the size of the lipoma before it is removed through surgery, or it may be done to remove the lipoma. Lipomas are removed with this method in order to limit incision size and scarring. A  liposuction tube is inserted through a small incision into the lipoma, and the contents of the lipoma are removed through the tube with suction. Follow these instructions at home:  Watch your lipoma for any changes.  Keep all follow-up visits as told by your health care provider. This is important. Contact a health care provider if:  Your lipoma becomes larger or hard.  Your lipoma becomes painful, red, or increasingly swollen. These could be signs of infection or a more serious condition. Get help right away if:  You develop tingling or numbness in an area near the lipoma. This could indicate that the lipoma is causing nerve damage. Summary  A lipoma is a noncancerous tumor that is made up of fat cells.  Most lipomas do not cause problems and do not require treatment.  If a lipoma is bigger or it causes problems, surgery may be done to remove the lipoma.  Contact a health care provider if your lipoma becomes larger or hard, or if it becomes painful, red, or increasingly swollen. Pain, redness, and swelling could be signs of infection or a more serious condition. This information is not intended to replace advice given to you by your health care provider. Make sure you discuss any questions you have with your health care provider. Document Revised: 05/16/2019 Document Reviewed: 05/16/2019 Elsevier Patient Education  2021 Elsevier Inc.  

## 2021-03-07 NOTE — Anesthesia Postprocedure Evaluation (Signed)
Anesthesia Post Note  Patient: Kenneth Wiggins  Procedure(s) Performed: EXCISION MASS OF RIGHT CHEST (Right )     Patient location during evaluation: PACU Anesthesia Type: General Level of consciousness: awake and alert Pain management: pain level controlled Vital Signs Assessment: post-procedure vital signs reviewed and stable Respiratory status: spontaneous breathing, nonlabored ventilation and respiratory function stable Cardiovascular status: blood pressure returned to baseline and stable Postop Assessment: no apparent nausea or vomiting Anesthetic complications: no   No complications documented.  Last Vitals:  Vitals:   03/07/21 0930 03/07/21 0945  BP: (!) 152/84 (!) 148/86  Pulse: 73 77  Resp: 11 14  Temp:    SpO2: 99% 100%    Last Pain:  Vitals:   03/07/21 0945  TempSrc:   PainSc: 0-No pain                 Lidia Collum

## 2021-03-07 NOTE — Op Note (Signed)
GRAYLON AMORY  08-22-1961   03/07/2021    PCP:  Deland Pretty, MD   Surgeon: Kaylyn Lim, MD, FACS  Asst:  Dolphus Jenny, MD  Anes:  general  Preop Dx: 9 cm mass of the lower chest right Postop Dx: same  Procedure: Excision of mass of chest wall Location Surgery: [facility] 4 Complications: None   EBL:   minimal cc  Drains: none  Description of Procedure:  The patient was taken to OR 4 .  After LMA anesthesia was administered and the patient was prepped  with chloroprep  and a timeout was performed.  The mass was easily identifiable.  A transverse incision was made after infiltration with marcaine.  A pseudo sac was identified and this plane was used to mobilize and then sever attachments.  The mass was excised en toto.  Hemostasis was achieved and the wound was closed in 4 layers with 3-0 vicryl and 4-0 Monocryl and then Dermabond.  The patient tolerated the procedure well and was taken to the PACU in stable condition.     Matt B. Hassell Done, [place], [facility], [place]

## 2021-03-07 NOTE — Anesthesia Procedure Notes (Addendum)
Procedure Name: LMA Insertion Date/Time: 03/07/2021 7:59 AM Performed by: Sharlette Dense, CRNA Patient Re-evaluated:Patient Re-evaluated prior to induction Oxygen Delivery Method: Circle system utilized Preoxygenation: Pre-oxygenation with 100% oxygen Induction Type: IV induction LMA: LMA inserted LMA Size: 4.0 Number of attempts: 1 Placement Confirmation: positive ETCO2 and breath sounds checked- equal and bilateral Tube secured with: Tape Dental Injury: Teeth and Oropharynx as per pre-operative assessment

## 2021-03-08 ENCOUNTER — Encounter (HOSPITAL_COMMUNITY): Payer: Self-pay | Admitting: Surgery

## 2021-03-08 LAB — SURGICAL PATHOLOGY

## 2021-03-12 ENCOUNTER — Encounter: Payer: Self-pay | Admitting: Family Medicine

## 2021-03-12 ENCOUNTER — Ambulatory Visit (INDEPENDENT_AMBULATORY_CARE_PROVIDER_SITE_OTHER): Payer: 59 | Admitting: Family Medicine

## 2021-03-12 ENCOUNTER — Other Ambulatory Visit: Payer: Self-pay

## 2021-03-12 VITALS — BP 104/68 | HR 88 | Ht 71.0 in | Wt 337.8 lb

## 2021-03-12 DIAGNOSIS — M75111 Incomplete rotator cuff tear or rupture of right shoulder, not specified as traumatic: Secondary | ICD-10-CM | POA: Diagnosis not present

## 2021-03-12 DIAGNOSIS — G5601 Carpal tunnel syndrome, right upper limb: Secondary | ICD-10-CM | POA: Diagnosis not present

## 2021-03-12 NOTE — Patient Instructions (Addendum)
Thank you for coming in today.  I have referred you to orthopedic surgery.   Cancel the hand surgery appointment on the 9th likely.   Call and set up your appointment with orthopedic surgery. You should see the surgeon or his PA about this (probably the surgeon himself).   I think both the shoulder and the wrist will need surgery.

## 2021-03-14 ENCOUNTER — Ambulatory Visit (INDEPENDENT_AMBULATORY_CARE_PROVIDER_SITE_OTHER): Payer: 59 | Admitting: Orthopaedic Surgery

## 2021-03-14 ENCOUNTER — Other Ambulatory Visit: Payer: Self-pay

## 2021-03-14 ENCOUNTER — Encounter: Payer: Self-pay | Admitting: Orthopaedic Surgery

## 2021-03-14 DIAGNOSIS — G5601 Carpal tunnel syndrome, right upper limb: Secondary | ICD-10-CM

## 2021-03-14 DIAGNOSIS — M75111 Incomplete rotator cuff tear or rupture of right shoulder, not specified as traumatic: Secondary | ICD-10-CM | POA: Diagnosis not present

## 2021-03-14 NOTE — Progress Notes (Signed)
Office Visit Note   Patient: Kenneth Wiggins           Date of Birth: Sep 17, 1961           MRN: [id number] Visit Date: 03/14/2021              Requested by: Gregor Hams, MD [place],  [place] [id number] PCP: Deland Pretty, MD   Assessment & Plan: Visit Diagnoses:  1. Nontraumatic incomplete tear of right rotator cuff   2. Carpal tunnel syndrome on right     Plan: Right shoulder MRI shows near full-thickness anterior supraspinatus tendon tear about a centimeter wide.  He has subscapularis tendinosis with partial-thickness interstitial tearing and medially positioned biceps tendon.  There was some bony edema in the lateral acromion but this appears to be stress reaction and should heal with conservative treatment.  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  Based on these findings and due to lack of improvement from conservative treatments I have recommended arthroscopic rotator cuff repair with debridements as indicated and biceps tenotomy with subacromial decompression.  For the right hand plan for right carpal tunnel release.  Details of each surgery discussed at length including risk benefits rehab recovery time and out of work.  All questions answered.  Follow-Up Instructions: Return for 1 week postop.   Orders:  No orders of the defined types were placed in this encounter.  No orders of the defined types were placed in this encounter.     Procedures: No procedures performed   Clinical Data: No additional findings.   Subjective: Chief Complaint  Patient presents with  . Right Shoulder - Pain  . Right Hand - Pain    Mr. Kenneth Wiggins is a 60 year old gentleman referral from Dr. Georgina Snell for evaluation for right shoulder rotator cuff tear recently found on MRI and severe carpal tunnel syndrome nerve conduction study.  He works as a Architectural technologist and he has had weakness and pain with pulling himself up  into the truck cab.  Prior to see me he had been seeing Dr. Georgina Snell and had undergone a course of formal outpatient physical therapy which did not provide significant relief.  He also had ultrasound-guided subacromial injection which provided him with about 4 days of relief.  He has constant numbness in the volar aspect of the tip of the thumb.   Review of Systems  Constitutional: Negative.   All other systems reviewed and are negative.    Objective: Vital Signs: There were no vitals taken for this visit.  Physical Exam Vitals and nursing note reviewed.  Constitutional:      Appearance: He is well-developed.  HENT:     Head: Normocephalic and atraumatic.  Eyes:     Pupils: Pupils are equal, round, and reactive to light.  Pulmonary:     Effort: Pulmonary effort is normal.  Abdominal:     Palpations: Abdomen is soft.  Musculoskeletal:        General: Normal range of motion.     Cervical back: Neck supple.  Skin:    General: Skin is warm.  Neurological:     Mental Status: He is alert and oriented to person, place, and time.  Psychiatric:        Behavior: Behavior normal.        Thought Content: Thought content normal.        Judgment: Judgment normal.  Ortho Exam Right shoulder examination shows moderate pain with shoulder abduction.  Range of motion is slightly decreased secondary to pain.  Moderate pain with empty can testing.  Strength 5- out of 5 to supraspinatus and 5 out of 5 infraspinatus.  Gweneth Dimitri is 5 out of 5.  AC joint nontender.  Negative cross body adduction.  Positive Neer and positive Hawkins.  No significant tenderness of the acromion.  Positive Speed test.  Right hand shows no muscle atrophy.  Positive carpal tunnel compressive signs.  Adequate range of motion of the thumb. Specialty Comments:  No specialty comments available.  Imaging: No results found.   PMFS History: Patient Active Problem List   Diagnosis Date Noted  . Nontraumatic incomplete  tear of right rotator cuff 03/12/2021  . Carpal tunnel syndrome on right 01/07/2021  . Prostate cancer [facility])    Past Medical History:  Diagnosis Date  . Asthma as child   hx of  . Cancer [facility])    prostate - psa 3.93 on 07/06/2012  . GERD (gastroesophageal reflux disease)    hx of  . H/O hiatal hernia 15 years ago  . Hypertension   . Prostate cancer ([place]) 08/09/12   Gleason 3+3=6, vol 20-25 gm  . Prostate cancer ([place]) 01-10-13   Seed implant to be done due to Robotic surgery aborted  . Sleep apnea    CPAP    Family History  Problem Relation Age of Onset  . Cancer Maternal Aunt        colon  . Cancer Paternal Uncle        prostate, 2 uncles  . Cancer Daughter        angiosarcoma age 51  . Prostate cancer Other     Past Surgical History:  Procedure Laterality Date  . HERNIA REPAIR  as child  . KNEE SURGERY Left as child   tore a ligament   . MASS EXCISION Right 03/07/2021   Procedure: EXCISION MASS OF RIGHT CHEST;  Surgeon: Johnathan Hausen, MD;  Location: [facility];  Service: General;  Laterality: Right;  . 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 RADIOACTIVE SEED IMPLANT N/A 01/17/2013   Procedure: RADIOACTIVE SEED IMPLANT;  Surgeon: Bernestine Amass, MD;  Location: [facility];  Service: Urology;  Laterality: N/A;  W/ MURRAY   . ROBOT ASSISTED LAPAROSCOPIC RADICAL PROSTATECTOMY N/A 11/24/2012   Procedure: Attempted ROBOTIC ASSISTED LAPAROSCOPIC RADICAL PROSTATECTOMY, Abandoned;  Surgeon: Bernestine Amass, MD;  Location: [facility];  Service: Urology;  Laterality: N/A;  . scrotum explotion Left   . testical growth removed    . VASECTOMY     Social History   Occupational History  . Occupation: TRUCK DRIVER     Employer: [employer]  Tobacco Use  . Smoking status: Never Smoker  . Smokeless tobacco: Never Used  Vaping Use  . Vaping Use: Never used  Substance and Sexual Activity  . Alcohol use: No  . Drug use: No  . Sexual activity: Yes

## 2021-03-29 ENCOUNTER — Other Ambulatory Visit: Payer: Self-pay

## 2021-03-29 NOTE — Pre-Procedure Instructions (Signed)
Surgical Instructions    Your procedure is scheduled on 04/03/2021.  Report to [facility] Main Entrance "A" at 10:00 A.M., then check in with the Admitting office.  Call this number if you have problems the morning of surgery:  [phone]   If you have any questions prior to your surgery date call [phone]: Open Monday-Friday 8am-4pm    Remember:   You may drink clear liquids until 09:00 the morning of your surgery.   Clear liquids allowed are: Water, Non-Citrus Juices (without pulp), Carbonated Beverages, Clear Tea, Black Coffee Only, and Gatorade    Enhanced Recovery after Surgery for Orthopedics Enhanced Recovery after Surgery is a protocol used to improve the stress on your body and your recovery after surgery.  Patient Instructions  The day of surgery (if you do NOT have diabetes):  Drink ONE (1) Pre-Surgery Clear Ensure by _9:00____ am the morning of surgery   This drink was given to you during your hospital  pre-op appointment visit. Nothing else to drink after completing the  Pre-Surgery Clear Ensure.          If you have questions, please contact your surgeon's office.   Take these medicines the morning of surgery with A SIP OF WATER  amLODipine (NORVASC) 10 MG tablet HYDROcodone-acetaminophen (NORCO/VICODIN) 5-325 MG tablet if needed loratadine (CLARITIN) 10 MG tablet   As of today, STOP taking any Aspirin (unless otherwise instructed by your surgeon) Aleve, Naproxen, Ibuprofen, Motrin, Advil, Goody's, BC's, all herbal medications, fish oil, and all vitamins.          Do not wear jewelry or makeup Do not wear lotions, powders, perfumes/colognes, or deodorant. Do not shave 48 hours prior to surgery.  Men may shave face and neck. Do not bring valuables to the hospital. DO Not wear nail polish, gel polish, artificial nails, or any other type of covering on  natural nails including finger and toenails. If patients have artificial nails, gel coating, etc. that  need to be removed by a nail salon please have this removed prior to surgery or surgery may need to be canceled/delayed if the surgeon/ anesthesia feels like the patient is unable to be adequately monitored.             [facility] is not responsible for any belongings or valuables.  Do NOT Smoke (Tobacco/Vaping) or drink Alcohol 24 hours prior to your procedure If you use a CPAP at night, you may bring all equipment for your overnight stay.   Contacts, glasses, dentures or bridgework may not be worn into surgery, please bring cases for these belongings   For patients admitted to the hospital, discharge time will be determined by your treatment team.   Patients discharged the day of surgery will not be allowed to drive home, and someone needs to stay with them for 24 hours.  ONLY 1 SUPPORT PERSON MAY BE PRESENT WHILE YOU ARE IN SURGERY. IF YOU ARE TO BE ADMITTED ONCE YOU ARE IN YOUR ROOM YOU WILL BE ALLOWED TWO (2) VISITORS.  Minor children may have two parents present. Special consideration for safety and communication needs will be reviewed on a case by case basis.  Special instructions:    Oral Hygiene is also important to reduce your risk of infection.  Remember - BRUSH YOUR TEETH THE MORNING OF SURGERY WITH YOUR REGULAR TOOTHPASTE   [place]- Preparing For Surgery  Before surgery, you can play an important role. Because skin is not sterile, your skin needs to  be as free of germs as possible. You can reduce the number of germs on your skin by washing with CHG (chlorahexidine gluconate) Soap before surgery.  CHG is an antiseptic cleaner which kills germs and bonds with the skin to continue killing germs even after washing.     Please do not use if you have an allergy to CHG or antibacterial soaps. If your skin becomes reddened/irritated stop using the CHG.  Do not shave (including legs and underarms) for at least 48 hours prior to first CHG shower. It is OK to shave your  face.  Please follow these instructions carefully.     Shower the NIGHT BEFORE SURGERY and the MORNING OF SURGERY with CHG Soap.   If you chose to wash your hair, wash your hair first as usual with your normal shampoo. After you shampoo, rinse your hair and body thoroughly to remove the shampoo.  Then [employer] and genitals (private parts) with your normal soap and rinse thoroughly to remove soap.  After that Use CHG Soap as you would any other liquid soap. You can apply CHG directly to the skin and wash gently with a scrungie or a clean washcloth.   Apply the CHG Soap to your body ONLY FROM THE NECK DOWN.  Do not use on open wounds or open sores. Avoid contact with your eyes, ears, mouth and genitals (private parts). Wash Face and genitals (private parts)  with your normal soap.   Wash thoroughly, paying special attention to the area where your surgery will be performed.  Thoroughly rinse your body with warm water from the neck down.  DO NOT shower/wash with your normal soap after using and rinsing off the CHG Soap.  Pat yourself dry with a CLEAN TOWEL.  Wear CLEAN PAJAMAS to bed the night before surgery  Place CLEAN SHEETS on your bed the night before your surgery  DO NOT SLEEP WITH PETS.   Day of Surgery:  Take a shower with CHG soap. Wear Clean/Comfortable clothing the morning of surgery Do not apply any deodorants/lotions.   Remember to brush your teeth WITH YOUR REGULAR TOOTHPASTE.   Please read over the following fact sheets that you were given.

## 2021-04-01 ENCOUNTER — Encounter (HOSPITAL_COMMUNITY)
Admission: RE | Admit: 2021-04-01 | Discharge: 2021-04-01 | Disposition: A | Discharge status: Home / Self Care | Payer: 59 | Source: Ambulatory Visit | Attending: Orthopaedic Surgery | Admitting: Orthopaedic Surgery

## 2021-04-01 ENCOUNTER — Other Ambulatory Visit: Payer: Self-pay

## 2021-04-01 ENCOUNTER — Encounter (HOSPITAL_COMMUNITY): Payer: Self-pay

## 2021-04-01 DIAGNOSIS — Z01812 Encounter for preprocedural laboratory examination: Secondary | ICD-10-CM | POA: Diagnosis not present

## 2021-04-01 LAB — CBC
HCT: 42.8 % (ref 39.0–52.0)
Hemoglobin: 15 g/dL (ref 13.0–17.0)
MCH: 29.9 pg (ref 26.0–34.0)
MCHC: 35 g/dL (ref 30.0–36.0)
MCV: 85.4 fL (ref 80.0–100.0)
Platelets: 241 10*3/uL (ref 150–400)
RBC: 5.01 MIL/uL (ref 4.22–5.81)
RDW: 13.8 % (ref 11.5–15.5)
WBC: 6.5 10*3/uL (ref 4.0–10.5)
nRBC: 0 % (ref 0.0–0.2)

## 2021-04-01 LAB — BASIC METABOLIC PANEL
Anion gap: 12 (ref 5–15)
BUN: 17 mg/dL (ref 6–20)
CO2: 28 mmol/L (ref 22–32)
Calcium: 9.5 mg/dL (ref 8.9–10.3)
Chloride: 97 mmol/L — ABNORMAL LOW (ref 98–111)
Creatinine, Ser: 1.35 mg/dL — ABNORMAL HIGH (ref 0.61–1.24)
GFR, Estimated: >60 mL/min (ref >60)
Glucose, Bld: 139 mg/dL — ABNORMAL HIGH (ref 70–99)
Potassium: 3.1 mmol/L — ABNORMAL LOW (ref 3.5–5.1)
Sodium: 137 mmol/L (ref 135–145)

## 2021-04-01 NOTE — Progress Notes (Signed)
PCP - Dr. Shelia Media Cardiologist - denies  Chest x-ray -  EKG - 02/25/21 Stress Test -  ECHO -  Cardiac Cath -   Sleep Study - yes CPAP - yes   ERAS Protcol - yes PRE-SURGERY Ensure or G2- ensure  COVID TEST- ambulatory - n/a   Anesthesia review: n/a  Patient denies shortness of breath, fever, cough and chest pain at PAT appointment   All instructions explained to the patient, with a verbal understanding of the material. Patient agrees to go over the instructions while at home for a better understanding. Patient also instructed to self quarantine after being tested for COVID-19. The opportunity to ask questions was provided.

## 2021-04-03 ENCOUNTER — Ambulatory Visit (HOSPITAL_COMMUNITY)
Admission: RE | Admit: 2021-04-03 | Discharge: 2021-04-03 | Disposition: A | Discharge status: Home / Self Care | Payer: 59 | Attending: Orthopaedic Surgery | Admitting: Orthopaedic Surgery

## 2021-04-03 ENCOUNTER — Ambulatory Visit (HOSPITAL_COMMUNITY): Payer: 59

## 2021-04-03 ENCOUNTER — Encounter (HOSPITAL_COMMUNITY): Payer: Self-pay | Admitting: Orthopaedic Surgery

## 2021-04-03 ENCOUNTER — Encounter: Payer: Self-pay | Admitting: Orthopaedic Surgery

## 2021-04-03 ENCOUNTER — Encounter (HOSPITAL_COMMUNITY)
Admission: RE | Disposition: A | Discharge status: Home / Self Care | Payer: Self-pay | Source: Home / Self Care | Attending: Orthopaedic Surgery

## 2021-04-03 DIAGNOSIS — S43431A Superior glenoid labrum lesion of right shoulder, initial encounter: Secondary | ICD-10-CM | POA: Diagnosis not present

## 2021-04-03 DIAGNOSIS — M7541 Impingement syndrome of right shoulder: Secondary | ICD-10-CM | POA: Insufficient documentation

## 2021-04-03 DIAGNOSIS — Z8042 Family history of malignant neoplasm of prostate: Secondary | ICD-10-CM | POA: Diagnosis not present

## 2021-04-03 DIAGNOSIS — Z8546 Personal history of malignant neoplasm of prostate: Secondary | ICD-10-CM | POA: Diagnosis not present

## 2021-04-03 DIAGNOSIS — M75111 Incomplete rotator cuff tear or rupture of right shoulder, not specified as traumatic: Secondary | ICD-10-CM

## 2021-04-03 DIAGNOSIS — Y939 Activity, unspecified: Secondary | ICD-10-CM | POA: Diagnosis not present

## 2021-04-03 DIAGNOSIS — Z800 Family history of malignant neoplasm of digestive organs: Secondary | ICD-10-CM | POA: Insufficient documentation

## 2021-04-03 DIAGNOSIS — Z79899 Other long term (current) drug therapy: Secondary | ICD-10-CM | POA: Diagnosis not present

## 2021-04-03 DIAGNOSIS — X58XXXA Exposure to other specified factors, initial encounter: Secondary | ICD-10-CM | POA: Diagnosis not present

## 2021-04-03 DIAGNOSIS — M75121 Complete rotator cuff tear or rupture of right shoulder, not specified as traumatic: Secondary | ICD-10-CM | POA: Diagnosis not present

## 2021-04-03 DIAGNOSIS — G5601 Carpal tunnel syndrome, right upper limb: Secondary | ICD-10-CM

## 2021-04-03 HISTORY — PX: CARPAL TUNNEL RELEASE: SHX101

## 2021-04-03 HISTORY — PX: SHOULDER ARTHROSCOPY WITH ROTATOR CUFF REPAIR AND SUBACROMIAL DECOMPRESSION: SHX5686

## 2021-04-03 SURGERY — SHOULDER ARTHROSCOPY WITH ROTATOR CUFF REPAIR AND SUBACROMIAL DECOMPRESSION
Anesthesia: General | Site: Shoulder | Laterality: Right

## 2021-04-03 MED ORDER — MIDAZOLAM HCL 2 MG/2ML IJ SOLN: 2.0000 mg | Freq: Once | INTRAMUSCULAR | Status: AC

## 2021-04-03 MED ORDER — OXYCODONE HCL 5 MG PO TABS: 5.0000 mg | ORAL_TABLET | Freq: Once | ORAL | Status: DC | PRN

## 2021-04-03 MED ORDER — FENTANYL CITRATE (PF) 100 MCG/2ML IJ SOLN
INTRAMUSCULAR | Status: AC
  Administered 2021-04-03: 100 ug via INTRAVENOUS
  Filled 2021-04-03: qty 2

## 2021-04-03 MED ORDER — LIDOCAINE 2% (20 MG/ML) 5 ML SYRINGE
INTRAMUSCULAR | Status: DC | PRN
  Administered 2021-04-03: 40 mg via INTRAVENOUS

## 2021-04-03 MED ORDER — BUPIVACAINE-EPINEPHRINE (PF) 0.25% -1:200000 IJ SOLN
INTRAMUSCULAR | Status: DC | PRN
  Administered 2021-04-03: 20 mL

## 2021-04-03 MED ORDER — SODIUM CHLORIDE 0.9 % IR SOLN
Status: DC | PRN
  Administered 2021-04-03: 1000 mL

## 2021-04-03 MED ORDER — MIDAZOLAM HCL 2 MG/2ML IJ SOLN
INTRAMUSCULAR | Status: AC
  Filled 2021-04-03: qty 2

## 2021-04-03 MED ORDER — LACTATED RINGERS IV SOLN: INTRAVENOUS | Status: DC

## 2021-04-03 MED ORDER — PROMETHAZINE HCL 25 MG/ML IJ SOLN: 6.2500 mg | INTRAMUSCULAR | Status: DC | PRN

## 2021-04-03 MED ORDER — BUPIVACAINE-EPINEPHRINE (PF) 0.5% -1:200000 IJ SOLN
INTRAMUSCULAR | Status: DC | PRN
  Administered 2021-04-03: 10 mL via PERINEURAL

## 2021-04-03 MED ORDER — PHENYLEPHRINE HCL-NACL 10-0.9 MG/250ML-% IV SOLN
INTRAVENOUS | Status: DC | PRN
  Administered 2021-04-03: 30 ug/min via INTRAVENOUS

## 2021-04-03 MED ORDER — MIDAZOLAM HCL 2 MG/2ML IJ SOLN
INTRAMUSCULAR | Status: AC
  Administered 2021-04-03: 2 mg via INTRAVENOUS
  Filled 2021-04-03: qty 2

## 2021-04-03 MED ORDER — HYDROMORPHONE HCL 1 MG/ML IJ SOLN: 0.2500 mg | INTRAMUSCULAR | Status: DC | PRN

## 2021-04-03 MED ORDER — PROPOFOL 10 MG/ML IV BOLUS
INTRAVENOUS | Status: DC | PRN
  Administered 2021-04-03: 200 mg via INTRAVENOUS
  Administered 2021-04-03 (×2): 100 mg via INTRAVENOUS

## 2021-04-03 MED ORDER — MIDAZOLAM HCL 2 MG/2ML IJ SOLN
0.5000 mg | Freq: Once | INTRAMUSCULAR | Status: DC | PRN
Start: 2021-04-03 — End: 2021-04-03

## 2021-04-03 MED ORDER — SUGAMMADEX SODIUM 200 MG/2ML IV SOLN
INTRAVENOUS | Status: DC | PRN
  Administered 2021-04-03: 400 mg via INTRAVENOUS

## 2021-04-03 MED ORDER — OXYCODONE-ACETAMINOPHEN 5-325 MG PO TABS: 1.0000 | ORAL_TABLET | Freq: Three times a day (TID) | ORAL | 0 refills | Status: DC | PRN

## 2021-04-03 MED ORDER — CEFAZOLIN IN SODIUM CHLORIDE 3-0.9 GM/100ML-% IV SOLN
3.0000 g | INTRAVENOUS | Status: AC
  Administered 2021-04-03: 3 g via INTRAVENOUS
  Filled 2021-04-03: qty 100

## 2021-04-03 MED ORDER — BUPIVACAINE-EPINEPHRINE (PF) 0.25% -1:200000 IJ SOLN
INTRAMUSCULAR | Status: AC
  Filled 2021-04-03: qty 30

## 2021-04-03 MED ORDER — FENTANYL CITRATE (PF) 100 MCG/2ML IJ SOLN: 100.0000 ug | Freq: Once | INTRAMUSCULAR | Status: AC

## 2021-04-03 MED ORDER — MEPERIDINE HCL 25 MG/ML IJ SOLN: 6.2500 mg | INTRAMUSCULAR | Status: DC | PRN

## 2021-04-03 MED ORDER — PROPOFOL 10 MG/ML IV BOLUS
INTRAVENOUS | Status: AC
  Filled 2021-04-03: qty 40

## 2021-04-03 MED ORDER — ORAL CARE MOUTH RINSE: 15.0000 mL | Freq: Once | OROMUCOSAL | Status: AC

## 2021-04-03 MED ORDER — OXYCODONE HCL 5 MG/5ML PO SOLN
5.0000 mg | Freq: Once | ORAL | Status: DC | PRN
Start: 2021-04-03 — End: 2021-04-03

## 2021-04-03 MED ORDER — DEXAMETHASONE SODIUM PHOSPHATE 10 MG/ML IJ SOLN
INTRAMUSCULAR | Status: DC | PRN
  Administered 2021-04-03: 5 mg via INTRAVENOUS
  Administered 2021-04-03: 4 mg via INTRAVENOUS

## 2021-04-03 MED ORDER — ACETAMINOPHEN 500 MG PO TABS: 1000.0000 mg | ORAL_TABLET | Freq: Once | ORAL | Status: DC

## 2021-04-03 MED ORDER — ROCURONIUM BROMIDE 10 MG/ML (PF) SYRINGE
PREFILLED_SYRINGE | INTRAVENOUS | Status: DC | PRN
  Administered 2021-04-03: 60 mg via INTRAVENOUS
  Administered 2021-04-03: 40 mg via INTRAVENOUS

## 2021-04-03 MED ORDER — FENTANYL CITRATE (PF) 250 MCG/5ML IJ SOLN
INTRAMUSCULAR | Status: AC
  Filled 2021-04-03: qty 5

## 2021-04-03 MED ORDER — BUPIVACAINE LIPOSOME 1.3 % IJ SUSP
INTRAMUSCULAR | Status: DC | PRN
  Administered 2021-04-03: 10 mL via PERINEURAL

## 2021-04-03 MED ORDER — CHLORHEXIDINE GLUCONATE 0.12 % MT SOLN
15.0000 mL | Freq: Once | OROMUCOSAL | Status: AC
  Administered 2021-04-03: 15 mL via OROMUCOSAL
  Filled 2021-04-03: qty 15

## 2021-04-03 SURGICAL SUPPLY — 72 items
ANCH SUT SWLK 24.5 SLF PNCH VT (Anchor) ×2 IMPLANT
ANCHOR BIOCOMP SWIVELOCK (Anchor) ×1 IMPLANT
BLADE EXCALIBUR 4.0X13 (MISCELLANEOUS) ×3 IMPLANT
BLADE SURG 11 STRL SS (BLADE) ×3 IMPLANT
BNDG CMPR 9X4 STRL LF SNTH (GAUZE/BANDAGES/DRESSINGS) ×2
BNDG CONFORM 3 STRL LF (GAUZE/BANDAGES/DRESSINGS) IMPLANT
BNDG ELASTIC 3X5.8 VLCR STR LF (GAUZE/BANDAGES/DRESSINGS) ×1 IMPLANT
BNDG ESMARK 4X9 LF (GAUZE/BANDAGES/DRESSINGS) ×3 IMPLANT
BOOTCOVER CLEANROOM LRG (PROTECTIVE WEAR) ×6 IMPLANT
BUR OVAL 4.0 (BURR) ×3 IMPLANT
BURR OVAL 8 FLU 4.0X13 (MISCELLANEOUS) ×1 IMPLANT
CANNULA 5.75X7 CRYSTAL CLEAR (CANNULA) ×3 IMPLANT
CANNULA TWIST IN 8.25X7CM (CANNULA) ×3 IMPLANT
CORD BIPOLAR FORCEPS 12FT (ELECTRODE) IMPLANT
COVER SURGICAL LIGHT HANDLE (MISCELLANEOUS) ×3 IMPLANT
COVER WAND RF STERILE (DRAPES) ×3 IMPLANT
CUFF TOURN SGL QUICK 18X4 (TOURNIQUET CUFF) IMPLANT
DRAPE HALF SHEET 40X57 (DRAPES) ×2 IMPLANT
DRAPE INCISE IOBAN 66X45 STRL (DRAPES) ×3 IMPLANT
DRAPE STERI 35X30 U-POUCH (DRAPES) ×3 IMPLANT
DRAPE SURG 17X23 STRL (DRAPES) IMPLANT
DRAPE U-SHAPE 47X51 STRL (DRAPES) ×3 IMPLANT
DRSG PAD ABDOMINAL 8X10 ST (GAUZE/BANDAGES/DRESSINGS) ×7 IMPLANT
DRSG XEROFORM 1X8 (GAUZE/BANDAGES/DRESSINGS) ×1 IMPLANT
DURAPREP 26ML APPLICATOR (WOUND CARE) ×6 IMPLANT
ELECT REM PT RETURN 9FT ADLT (ELECTROSURGICAL)
ELECTRODE REM PT RTRN 9FT ADLT (ELECTROSURGICAL) IMPLANT
GAUZE SPONGE 4X4 12PLY STRL (GAUZE/BANDAGES/DRESSINGS) ×3 IMPLANT
GAUZE SPONGE 4X4 12PLY STRL LF (GAUZE/BANDAGES/DRESSINGS) ×1 IMPLANT
GAUZE XEROFORM 1X8 LF (GAUZE/BANDAGES/DRESSINGS) ×3 IMPLANT
GLOVE SURG LTX SZ7 (GLOVE) ×6 IMPLANT
GLOVE SURG NEOP MICRO LF SZ7.5 (GLOVE) ×6 IMPLANT
GLOVE SURG UNDER POLY LF SZ7 (GLOVE) ×6 IMPLANT
GOWN STRL REIN XL XLG (GOWN DISPOSABLE) ×6 IMPLANT
GOWN STRL REUS W/ TWL LRG LVL3 (GOWN DISPOSABLE) ×2 IMPLANT
GOWN STRL REUS W/TWL LRG LVL3 (GOWN DISPOSABLE) ×3
KIT BASIN OR (CUSTOM PROCEDURE TRAY) ×3 IMPLANT
KIT SHOULDER TRACTION (DRAPES) ×3 IMPLANT
KIT TURNOVER KIT B (KITS) ×3 IMPLANT
MANIFOLD NEPTUNE II (INSTRUMENTS) ×3 IMPLANT
NDL SCORPION MULTI FIRE (NEEDLE) ×2 IMPLANT
NDL SPNL 18GX3.5 QUINCKE PK (NEEDLE) ×2 IMPLANT
NEEDLE SCORPION MULTI FIRE (NEEDLE) ×3 IMPLANT
NEEDLE SPNL 18GX3.5 QUINCKE PK (NEEDLE) ×3 IMPLANT
NS IRRIG 1000ML POUR BTL (IV SOLUTION) ×6 IMPLANT
PACK ORTHO EXTREMITY (CUSTOM PROCEDURE TRAY) ×3 IMPLANT
PACK SHOULDER (CUSTOM PROCEDURE TRAY) ×3 IMPLANT
PAD ARMBOARD 7.5X6 YLW CONV (MISCELLANEOUS) ×6 IMPLANT
PAD CAST 3X4 CTTN HI CHSV (CAST SUPPLIES) IMPLANT
PADDING CAST ABS 3INX4YD NS (CAST SUPPLIES) ×1
PADDING CAST ABS 4INX4YD NS (CAST SUPPLIES) ×2
PADDING CAST ABS COTTON 3X4 (CAST SUPPLIES) IMPLANT
PADDING CAST ABS COTTON 4X4 ST (CAST SUPPLIES) ×4 IMPLANT
PADDING CAST COTTON 3X4 STRL (CAST SUPPLIES) ×3
PORT APPOLLO RF 90DEGREE MULTI (SURGICAL WAND) ×3 IMPLANT
SLING ARM FOAM STRAP LRG (SOFTGOODS) IMPLANT
SLING ARM FOAM STRAP MED (SOFTGOODS) IMPLANT
SLING ARM IMMOBILIZER XL (CAST SUPPLIES) ×1 IMPLANT
SPONGE LAP 4X18 RFD (DISPOSABLE) ×3 IMPLANT
SUT ETHILON 3 0 FSL (SUTURE) ×1 IMPLANT
SUT ETHILON 3 0 PS 1 (SUTURE) ×4 IMPLANT
SUT ETHILON 4 0 PS 2 18 (SUTURE) ×3 IMPLANT
SUT TIGER TAPE 7 IN WHITE (SUTURE) IMPLANT
SYR CONTROL 10ML LL (SYRINGE) IMPLANT
TAPE FIBER 2MM 7IN #2 BLUE (SUTURE) ×3 IMPLANT
TAPE PAPER 3X10 WHT MICROPORE (GAUZE/BANDAGES/DRESSINGS) ×3 IMPLANT
TOWEL GREEN STERILE (TOWEL DISPOSABLE) ×3 IMPLANT
TOWEL GREEN STERILE FF (TOWEL DISPOSABLE) ×3 IMPLANT
TUBE CONNECTING 20X1/4 (TUBING) ×1 IMPLANT
TUBING ARTHROSCOPY IRRIG 16FT (MISCELLANEOUS) ×3 IMPLANT
UNDERPAD 30X36 HEAVY ABSORB (UNDERPADS AND DIAPERS) ×6 IMPLANT
WATER STERILE IRR 1000ML POUR (IV SOLUTION) ×3 IMPLANT

## 2021-04-03 NOTE — Discharge Instructions (Signed)
Post-operative patient instructions  Shoulder Arthroscopy   Ice:  Place intermittent ice or cooler pack over your shoulder, 30 minutes on and 30 minutes off.  Continue this for the first 72 hours after surgery, then save ice for use after therapy sessions or on more active days.   Weight:  You may NOT bear weight on your arm as your symptoms allow. Dressing:  Perform 1st dressing change at 2 days postoperative. A moderate amount of blood tinged drainage is to be expected.  So if you bleed through the dressing on the first or second day or if you have fevers, it is fine to change the dressing/check the wounds early and redress wound.  If it bleeds through again, or if the incisions are leaking frank blood, please call the office. May change dressing every 1-2 days thereafter to help watch wounds. Can purchase Tegaderm (or 20M Nexcare) water resistant dressings at local pharmacy / [facility]. Shower:  Light shower is ok after 2 days.  Please take shower, NO bath. Recover with gauze and ace wrap to help keep wounds protected.   Pain medication:  A narcotic pain medication has been prescribed.  Take as directed.  Typically you need narcotic pain medication more regularly during the first 3 to 5 days after surgery.  Decrease your use of the medication as the pain improves.  Narcotics can sometimes cause constipation, even after a few doses.  If you have problems with constipation, you can take an over the counter stool softener or light laxative.  If you have persistent problems, please notify your physician's office. Physical therapy: Additional activity guidelines to be provided by your physician or physical therapist at follow-up visits.  Driving: Do not recommend driving x 2 weeks post surgical, especially if surgery performed on right side. Should not drive while taking narcotic pain medications. It typically takes at least 2 weeks to restore sufficient neuromuscular function for normal reaction times for  driving safety.  Call [phone] for questions or problems. Evenings you will be forwarded to the hospital operator.  Ask for the orthopaedic physician on call. Please call if you experience:    Redness, foul smelling, or persistent drainage from the surgical site  worsening shoulder pain and swelling not responsive to medication  any calf pain and or swelling of the lower leg  temperatures greater than 101.5 F other questions or concerns   Thank you for allowing [place] to be a part of your care.    Postoperative instructions: Carpal tunnel release  Dressing instructions: Keep your dressing and/or splint clean and dry at all times.  It will be removed at your first post-operative appointment.  Your stitches and/or staples will be removed at this visit.  Incision instructions:  Do not soak your incision for 3 weeks after surgery.  If the incision gets wet, pat dry and do not scrub the incision.  Pain control:  You have been given a prescription to be taken as directed for post-operative pain control.  In addition, elevate the operative extremity above the heart at all times to prevent swelling and throbbing pain.  Take over-the-counter Colace, 100mg  by mouth twice a day while taking narcotic pain medications to help prevent constipation.  Follow up appointments: 1) 7 days for wound check. 2) Dr. Erlinda Hong as scheduled.   -------------------------------------------------------------------------------------------------------------  After Surgery Pain Control:  After your surgery, post-surgical discomfort or pain is likely. This discomfort can last several days to a few weeks. At certain times of  the day your discomfort may be more intense.  Did you receive a nerve block?  A nerve block can provide pain relief for one hour to two days after your surgery. As long as the nerve block is working, you will experience little or no sensation in the area the surgeon operated on.  As the nerve block  wears off, you will begin to experience pain or discomfort. It is very important that you begin taking your prescribed pain medication before the nerve block fully wears off. Treating your pain at the first sign of the block wearing off will ensure your pain is better controlled and more tolerable when full-sensation returns. Do not wait until the pain is intolerable, as the medicine will be less effective. It is better to treat pain in advance than to try and catch up.  General Anesthesia:  If you did not receive a nerve block during your surgery, you will need to start taking your pain medication shortly after your surgery and should continue to do so as prescribed by your surgeon.  Pain Medication:  Most commonly we prescribe Vicodin and Percocet for post-operative pain. Both of these medications contain a combination of acetaminophen (Tylenol) and a narcotic to help control pain.   It takes between 30 and 45 minutes before pain medication starts to work. It is important to take your medication before your pain level gets too intense.   Nausea is a common side effect of many pain medications. You will want to eat something before taking your pain medicine to help prevent nausea.   If you are taking a prescription pain medication that contains acetaminophen, we recommend that you do not take additional over the counter acetaminophen (Tylenol).  Other pain relieving options:   Using a cold pack to ice the affected area a few times a day (15 to 20 minutes at a time) can help to relieve pain, reduce swelling and bruising.   Elevation of the affected area can also help to reduce pain and swelling.

## 2021-04-03 NOTE — Anesthesia Procedure Notes (Signed)
Procedure Name: Intubation Date/Time: 04/03/2021 12:02 PM Performed by: Janace Litten, CRNA Pre-anesthesia Checklist: Patient identified, Emergency Drugs available, Suction available and Patient being monitored Patient Re-evaluated:Patient Re-evaluated prior to induction Oxygen Delivery Method: Circle System Utilized Preoxygenation: Pre-oxygenation with 100% oxygen Induction Type: IV induction Ventilation: Mask ventilation without difficulty and Oral airway inserted - appropriate to patient size Laryngoscope Size: Mac and 4 Grade View: Grade I Tube type: Oral Tube size: 7.5 mm Number of attempts: 1 Airway Equipment and Method: Stylet and Oral airway Placement Confirmation: ETT inserted through vocal cords under direct vision, positive ETCO2 and breath sounds checked- equal and bilateral Secured at: 23 cm Tube secured with: Tape Dental Injury: Teeth and Oropharynx as per pre-operative assessment

## 2021-04-03 NOTE — Op Note (Signed)
Date of Surgery: 04/03/2021  INDICATIONS: The patient is a 60 year old male with right shoulder pain that has failed conservative treatment;  The patient did consent to the procedure after discussion of the risks and benefits.  PREOPERATIVE DIAGNOSIS:  Full-thickness right supraspinatus tear without retraction Right shoulder impingement syndrome. Degenerative labral and SLAP tears Right carpal tunnel syndrome  POSTOPERATIVE DIAGNOSIS: Same.  PROCEDURE:  1.  Arthroscopic right shoulder rotator cuff repair 2.  Right shoulder arthroscopic extensive debridement 3.  Right shoulder arthroscopic subacromial decompression with acromioplasty and CA ligament release 4.  Right carpal tunnel release open  SURGEON: N. [name], M.D.  ASSIST: Madalyn Rob, PA-C  ANESTHESIA:  general, regional  IV FLUIDS AND URINE: See anesthesia.  ESTIMATED BLOOD LOSS: minimal mL.  IMPLANTS: Arthrex 4.75 mm swivel lock  COMPLICATIONS: None.  DESCRIPTION OF PROCEDURE: The patient was brought to the operating room and placed supine on the operating table.  The patient had been signed prior to the procedure and this was documented. The patient had the anesthesia placed by the anesthesiologist.  A time-out was performed to confirm that this was the correct patient, site, side and location. The patient did receive antibiotics prior to the incision and was re-dosed during the procedure as needed at indicated intervals.  The patient was then positioned into the beach chair position with all bony prominences well padded and neutral C spine. The patient had the operative extremity prepped and draped in the standard surgical fashion.   We first began with the right carpal tunnel release.  An incision was made about 5 mm ulnar to the thenar crease in line with the radial border of the ring finger.  Dissection was carried down through the palmar fascia onto the transverse carpal ligament which was then sharply  divided under direct visualization.  A subcutaneous tunnel was then created with a mosquito hemostat and a sole retractor was placed and the antebrachial fascia was released with tenotomy scissors under direct visualization.  The remaining distal fibers of the transverse carpal ligament was released taking care not to go distal to Kaplan's cardinal line.  Local anesthetic was placed.  This was thoroughly irrigated with normal saline.  Skin was closed with 3-0 nylon.  Sterile dressings were applied.  We then turned our attention to the shoulder. Incisions were made for arthroscopic shoulder portals.  Of note the shoulder joint was fairly tight.  The range of motion was symmetric to the contralateral side.  He did not have synovitis that is usually representative of adhesive capsulitis.  Once we are in the shoulder joint we did a diagnostic shoulder arthroscopy.  There was minimal chondromalacia of the glenohumeral surface.  He did have a degenerative type II SLAP tear and a mildly degenerative biceps tendon.  Biceps tenotomy was performed.  The stump was debrided down to a smooth surface.  The articular surface of the rotator cuff appeared to be intact.  The arthroscope was then repositioned into the subacromial space.  Subacromial decompression with acromioplasty and CA ligament release as well as subdeltoid and subacromial bursectomy performed.  We then performed gentle debridement of the supraspinatus and we are able to find a small crescent shaped full-thickness tear of the anterior portion of the supraspinatus corresponding with the MRI findings.  This measured about 5 mm across.  The bone was prepared using a bur.  Single horizontal mattress fiber tape anchored into a swivel lock repair of the rotator cuff was performed.  The tissue had excellent excursion.  I was happy with the watertight repair.  Excess fluid was then removed from the shoulder joint.  Incisions closed with interrupted nylon sutures.  Sterile  dressings were applied.  Shoulder immobilizer was placed.  Patient tolerated procedure well had no many complications. Tawanna Cooler, my PA, was a medical necessity for the entirety of the surgery including opening, closing, limb positioning, retracting, exposing, and repairing.  POSTOPERATIVE PLAN: Patient will be discharged home and follow-up in 1 week for suture removal from the right shoulder.  Azucena Cecil, MD [facility] [phone] 1:46 PM

## 2021-04-03 NOTE — Anesthesia Procedure Notes (Signed)
Anesthesia Regional Block: Interscalene brachial plexus block   Pre-Anesthetic Checklist: , timeout performed,  Correct Patient, Correct Site, Correct Laterality,  Correct Procedure, Correct Position, site marked,  Risks and benefits discussed,  Surgical consent,  Pre-op evaluation,  At surgeon's request and post-op pain management  Laterality: Right and Upper  Prep: chloraprep       Needles:  Injection technique: Single-shot  Needle Type: Echogenic Needle     Needle Length: 9cm  Needle Gauge: 21     Additional Needles:   Procedures:,,,, ultrasound used (permanent image in chart),,    Narrative:  Start time: 04/03/2021 11:33 AM End time: 04/03/2021 11:39 AM Injection made incrementally with aspirations every 5 mL.  Performed by: Personally  Anesthesiologist: Annye Asa, MD  Additional Notes: Pt identified in Holding room.  Monitors applied. Working IV access confirmed. Sterile prep R neck.  #21ga ECHOgenic Arrow block needle to interscalene brachial plexus with US guidance.  10cc 0.5% Bupivacaine with 1:200k epi with Exparel injected incrementally after negative test dose.  Patient asymptomatic, VSS, no heme aspirated, tolerated well.   Jenita Seashore, MD

## 2021-04-03 NOTE — Anesthesia Preprocedure Evaluation (Addendum)
Anesthesia Evaluation  Patient identified by MRN, date of birth, ID band Patient awake    Reviewed: Allergy & Precautions, NPO status , Patient's Chart, lab work & pertinent test results  History of Anesthesia Complications Negative for: history of anesthetic complications  Airway Mallampati: I  TM Distance: >3 FB Neck ROM: Full    Dental  (+) Dental Advisory Given   Pulmonary sleep apnea and Continuous Positive Airway Pressure Ventilation ,    breath sounds clear to auscultation       Cardiovascular hypertension, Pt. on medications (-) angina Rhythm:Regular Rate:Normal     Neuro/Psych negative neurological ROS     GI/Hepatic Neg liver ROS, GERD  Controlled,  Endo/Other  Morbid obesity  Renal/GU Renal InsufficiencyRenal disease   H/o prostate cancer    Musculoskeletal   Abdominal (+) + obese,   Peds  Hematology negative hematology ROS (+)   Anesthesia Other Findings   Reproductive/Obstetrics                            Anesthesia Physical Anesthesia Plan  ASA: 3  Anesthesia Plan: General   Post-op Pain Management: GA combined w/ Regional for post-op pain   Induction: Intravenous  PONV Risk Score and Plan: 2 and Ondansetron and Dexamethasone  Airway Management Planned: Oral ETT  Additional Equipment: None  Intra-op Plan:   Post-operative Plan: Extubation in OR  Informed Consent: I have reviewed the patients History and Physical, chart, labs and discussed the procedure including the risks, benefits and alternatives for the proposed anesthesia with the patient or authorized representative who has indicated his/her understanding and acceptance.     Dental advisory given  Plan Discussed with: CRNA and Surgeon  Anesthesia Plan Comments: (Plan routine monitors, GETA with interscalene block for post op analgesia)       Anesthesia Quick Evaluation

## 2021-04-03 NOTE — Anesthesia Postprocedure Evaluation (Signed)
Anesthesia Post Note  Patient: Kenneth Wiggins  Procedure(s) Performed: RIGHT SHOULDER ARTHROSCOPY ROTATOR CUFF REPAIR, SUBACROMIAL DECOMPRESSION, EXTENSIVE DEBRIDEMENT (Right: Shoulder) RIGHT CARPAL TUNNEL RELEASE (Right: Hand)     Patient location during evaluation: PACU Anesthesia Type: General Level of consciousness: awake and alert, patient cooperative and oriented Pain management: pain level controlled Vital Signs Assessment: post-procedure vital signs reviewed and stable Respiratory status: spontaneous breathing, nonlabored ventilation and respiratory function stable Cardiovascular status: blood pressure returned to baseline and stable Postop Assessment: no apparent nausea or vomiting Anesthetic complications: no   No notable events documented.  Last Vitals:  Vitals:   04/03/21 1150 04/03/21 1405  BP:  122/67  Pulse: 85 92  Resp: 19 14  Temp:  36.4 C  SpO2: 100% 93%    Last Pain:  Vitals:   04/03/21 1405  TempSrc:   PainSc: Asleep                 Tiersa Dayley,E. Lovette Merta

## 2021-04-03 NOTE — Transfer of Care (Signed)
Immediate Anesthesia Transfer of Care Note  Patient: Kenneth Wiggins  Procedure(s) Performed: RIGHT SHOULDER ARTHROSCOPY ROTATOR CUFF REPAIR, SUBACROMIAL DECOMPRESSION, EXTENSIVE DEBRIDEMENT (Right: Shoulder) RIGHT CARPAL TUNNEL RELEASE (Right: Hand)  Patient Location: PACU  Anesthesia Type:GA combined with regional for post-op pain  Level of Consciousness: drowsy, patient cooperative and responds to stimulation  Airway & Oxygen Therapy: Patient Spontanous Breathing  Post-op Assessment: Report given to RN and Post -op Vital signs reviewed and stable  Post vital signs: Reviewed and stable  Last Vitals:  Vitals Value Taken Time  BP 122/67 04/03/21 1406  Temp    Pulse 91 04/03/21 1409  Resp 18 04/03/21 1409  SpO2 94 % 04/03/21 1409  Vitals shown include unvalidated device data.  Last Pain:  Vitals:   04/03/21 1002  TempSrc:   PainSc: 4       Patients Stated Pain Goal: 0 (50/03/70 4888)  Complications: No notable events documented.

## 2021-04-03 NOTE — H&P (Signed)
PREOPERATIVE H&P  Chief Complaint: right shoulder rotator cuff tear, tendinosis and right carpal tunnel syndrome  HPI: Kenneth Wiggins is a 60 y.o. male who presents for surgical treatment of right shoulder rotator cuff tear, tendinosis and right carpal tunnel syndrome.  He denies any changes in medical history.  Past Medical History:  Diagnosis Date   Asthma as child   hx of   Cancer [facility])    prostate - psa 3.93 on 07/06/2012   GERD (gastroesophageal reflux disease)    hx of   H/O hiatal hernia 15 years ago   Hypertension    Prostate cancer ([place]) 08/09/12   Gleason 3+3=6, vol 20-25 gm   Prostate cancer ([place]) 01-10-13   Seed implant to be done due to Robotic surgery aborted   Sleep apnea    CPAP   Past Surgical History:  Procedure Laterality Date   HERNIA REPAIR  as child   KNEE SURGERY Left as child   tore a ligament    MASS EXCISION Right 03/07/2021   Procedure: EXCISION MASS OF RIGHT CHEST;  Surgeon: Johnathan Hausen, MD;  Location: [facility] ORS;  Service: General;  Laterality: Right;   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   RADIOACTIVE SEED IMPLANT N/A 01/17/2013   Procedure: RADIOACTIVE SEED IMPLANT;  Surgeon: Bernestine Amass, MD;  Location: [facility];  Service: Urology;  Laterality: N/A;  W/ MURRAY    ROBOT ASSISTED LAPAROSCOPIC RADICAL PROSTATECTOMY N/A 11/24/2012   Procedure: Attempted ROBOTIC ASSISTED LAPAROSCOPIC RADICAL PROSTATECTOMY, Abandoned;  Surgeon: Bernestine Amass, MD;  Location: [facility];  Service: Urology;  Laterality: N/A;   scrotum explotion Left    testical growth removed     VASECTOMY     Social History   Socioeconomic History   Marital status: Widowed    Spouse name: Not on file   Number of children: 2   Years of education: Not on file   Highest education level: Not on file  Occupational History   Occupation: TRUCK DRIVER     Employer: [employer]  Tobacco Use   Smoking status: Never   Smokeless tobacco: Never  Vaping Use   Vaping Use:  Never used  Substance and Sexual Activity   Alcohol use: No   Drug use: No   Sexual activity: Yes  Other Topics Concern   Not on file  Social History Narrative   Not on file   Social Determinants of Health   Financial Resource Strain: Not on file  Food Insecurity: Not on file  Transportation Needs: Not on file  Physical Activity: Not on file  Stress: Not on file  Social Connections: Not on file   Family History  Problem Relation Age of Onset   Cancer Maternal Aunt        colon   Cancer Paternal Uncle        prostate, 2 uncles   Cancer Daughter        angiosarcoma age 6   Prostate cancer Other    No Known Allergies Prior to Admission medications   Medication Sig Start Date End Date Taking? Authorizing Provider  amLODipine (NORVASC) 10 MG tablet Take 10 mg by mouth daily.   Yes [provider]  chlorthalidone (HYGROTON) 25 MG tablet Take 25 mg by mouth daily.   Yes [provider]  loratadine (CLARITIN) 10 MG tablet Take 10 mg by mouth daily.   Yes [provider]  Multiple Vitamins-Minerals (MULTIVITAMIN WITH MINERALS) tablet Take  1 tablet by mouth daily.   Yes [provider]  olmesartan (BENICAR) 40 MG tablet Take 40 mg by mouth daily.   Yes [provider]  HYDROcodone-acetaminophen (NORCO/VICODIN) 5-325 MG tablet Take 1 tablet by mouth every 6 (six) hours as needed for moderate pain. Patient not taking: Reported on 03/25/2021 03/07/21   Johnathan Hausen, MD     Positive ROS: All other systems have been reviewed and were otherwise negative with the exception of those mentioned in the HPI and as above.  Physical Exam: General: Alert, no acute distress Cardiovascular: No pedal edema Respiratory: No cyanosis, no use of accessory musculature GI: abdomen soft Skin: No lesions in the area of chief complaint Neurologic: Sensation intact distally Psychiatric: Patient is competent for consent with normal mood and  affect Lymphatic: no lymphedema  MUSCULOSKELETAL: exam stable  Assessment: right shoulder rotator cuff tear, tendinosis and right carpal tunnel syndrome  Plan: Plan for Procedure(s): RIGHT SHOULDER ARTHROSCOPY ROTATOR CUFF REPAIR, SUBACROMIAL DECOMPRESSION, EXTENSIVE DEBRIDEMENT RIGHT CARPAL TUNNEL RELEASE  The risks benefits and alternatives were discussed with the patient including but not limited to the risks of nonoperative treatment, versus surgical intervention including infection, bleeding, nerve injury,  blood clots, cardiopulmonary complications, morbidity, mortality, among others, and they were willing to proceed.   Preoperative templating of the joint replacement has been completed, documented, and submitted to the Operating Room personnel in order to optimize intra-operative equipment management.   [name], MD 04/03/2021 9:31 AM

## 2021-04-04 ENCOUNTER — Encounter (HOSPITAL_COMMUNITY): Payer: Self-pay | Admitting: Orthopaedic Surgery

## 2021-04-10 ENCOUNTER — Encounter: Payer: Self-pay | Admitting: Orthopaedic Surgery

## 2021-04-10 ENCOUNTER — Ambulatory Visit (INDEPENDENT_AMBULATORY_CARE_PROVIDER_SITE_OTHER): Payer: 59 | Admitting: Physician Assistant

## 2021-04-10 ENCOUNTER — Telehealth: Payer: Self-pay | Admitting: Orthopaedic Surgery

## 2021-04-10 DIAGNOSIS — Z9889 Other specified postprocedural states: Secondary | ICD-10-CM

## 2021-04-10 NOTE — Telephone Encounter (Signed)
Received $50.00 cash from patient for 2 additional forms     Forwarding to [employer] today

## 2021-04-10 NOTE — Telephone Encounter (Signed)
Called patient left message advised he has 3 forms for [employer] to complete and another $50.00 is due before forms can be completed.

## 2021-04-10 NOTE — Telephone Encounter (Signed)
Received $25.00 check,FMLA/Disability paperwork and medical records form/Forwarding to [facility] today

## 2021-04-10 NOTE — Progress Notes (Signed)
Post-Op Visit Note   Patient: Kenneth Wiggins           Date of Birth: 1961/09/29           MRN: [id number] Visit Date: 04/10/2021 PCP: Deland Pretty, MD   Assessment & Plan:  Chief Complaint:  Chief Complaint  Patient presents with   Right Shoulder - Routine Post Op   Visit Diagnoses:  1. S/P right rotator cuff repair   2. S/P carpal tunnel release     Plan: Patient is a pleasant 60 year old gentleman who comes in today 1 week out right shoulder arthroscopic debridement, rotator cuff repair and biceps tenotomy in addition to right carpal tunnel release, date of surgery 04/03/2021.  He has been doing well.  He has been taking Tylenol for pain.  He has been compliant wearing his sling.  Examination of his right shoulder reveals fully healed surgical portals with nylon sutures in place.  No evidence of infection or cellulitis.  He does have a small blister to the top of the shoulder which looks like it is from the tape.  Examination of his right wrist reveals a well-healing surgical incision with nylon sutures in place.  No evidence of infection or cellulitis there.  Fingers are warm and well-perfused.  He is neurovascular intact distally.  In regards to the right shoulder, sutures were removed today and Steri-Strips applied.  We will continue wearing his sling for another 5 weeks.  We will go ahead and start him in outpatient physical therapy and an internal referral has been made.  For the right carpal tunnel release, we will clean and recover the wound.  We will provide him with a removable splint for which she will wear the next week.  Follow-up with [place] next week for suture removal.  No heavy lifting or submerging his hand in water for another 2 weeks.  Call with concerns or questions in the meantime.  Follow-Up Instructions: Return in about 1 week (around 04/17/2021) for for carpal tunnel release.   Orders:  No orders of the defined types were placed in this encounter.  No orders of the  defined types were placed in this encounter.   Imaging: No results found.  PMFS History: Patient Active Problem List   Diagnosis Date Noted   Nontraumatic incomplete tear of right rotator cuff 03/12/2021   Carpal tunnel syndrome on right 01/07/2021   Prostate cancer [facility])    Past Medical History:  Diagnosis Date   Asthma as child   hx of   Cancer ([place])    prostate - psa 3.93 on 07/06/2012   GERD (gastroesophageal reflux disease)    hx of   H/O hiatal hernia 15 years ago   Hypertension    Prostate cancer ([place]) 08/09/12   Gleason 3+3=6, vol 20-25 gm   Prostate cancer ([place]) 01-10-13   Seed implant to be done due to Robotic surgery aborted   Sleep apnea    CPAP    Family History  Problem Relation Age of Onset   Cancer Maternal Aunt        colon   Cancer Paternal Uncle        prostate, 2 uncles   Cancer Daughter        angiosarcoma age 81   Prostate cancer Other     Past Surgical History:  Procedure Laterality Date   CARPAL TUNNEL RELEASE Right 04/03/2021   Procedure: RIGHT CARPAL TUNNEL RELEASE;  Surgeon: Leandrew Koyanagi, MD;  Location:  [facility];  Service: Orthopedics;  Laterality: Right;   HERNIA REPAIR  as child   KNEE SURGERY Left as child   tore a ligament    MASS EXCISION Right 03/07/2021   Procedure: EXCISION MASS OF RIGHT CHEST;  Surgeon: Johnathan Hausen, MD;  [facility];  Service: General;  Laterality: Right;   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   RADIOACTIVE SEED IMPLANT N/A 01/17/2013   Procedure: RADIOACTIVE SEED IMPLANT;  Surgeon: Bernestine Amass, MD;  [facility];  Service: Urology;  Laterality: N/A;  W/ MURRAY    ROBOT ASSISTED LAPAROSCOPIC RADICAL PROSTATECTOMY N/A 11/24/2012   Procedure: Attempted ROBOTIC ASSISTED LAPAROSCOPIC RADICAL PROSTATECTOMY, Abandoned;  Surgeon: Bernestine Amass, MD;  [facility];  Service: Urology;  Laterality: N/A;   scrotum explotion Left    SHOULDER ARTHROSCOPY WITH ROTATOR CUFF REPAIR AND SUBACROMIAL  DECOMPRESSION Right 04/03/2021   Procedure: RIGHT SHOULDER ARTHROSCOPY ROTATOR CUFF REPAIR, SUBACROMIAL DECOMPRESSION, EXTENSIVE DEBRIDEMENT;  Surgeon: Leandrew Koyanagi, MD;  Location: [facility];  Service: Orthopedics;  Laterality: Right;   testical growth removed     VASECTOMY     Social History   Occupational History   Occupation: TRUCK DRIVER     Employer: [employer]  Tobacco Use   Smoking status: Never   Smokeless tobacco: Never  Vaping Use   Vaping Use: Never used  Substance and Sexual Activity   Alcohol use: No   Drug use: No   Sexual activity: Yes

## 2021-04-17 ENCOUNTER — Ambulatory Visit (INDEPENDENT_AMBULATORY_CARE_PROVIDER_SITE_OTHER): Payer: 59 | Admitting: Rehabilitative and Restorative Service Providers"

## 2021-04-17 ENCOUNTER — Encounter: Payer: Self-pay | Admitting: Rehabilitative and Restorative Service Providers"

## 2021-04-17 ENCOUNTER — Other Ambulatory Visit: Payer: Self-pay

## 2021-04-17 DIAGNOSIS — R600 Localized edema: Secondary | ICD-10-CM

## 2021-04-17 DIAGNOSIS — M6281 Muscle weakness (generalized): Secondary | ICD-10-CM

## 2021-04-17 DIAGNOSIS — M25612 Stiffness of left shoulder, not elsewhere classified: Secondary | ICD-10-CM

## 2021-04-17 NOTE — Patient Instructions (Signed)
Access Code: [email or web address] URL: [email or web address] Date: 04/17/2021 Prepared by: Vista Mink  Exercises Pendulums - 5 x daily - 7 x weekly - 1 sets - 30 reps

## 2021-04-17 NOTE — Therapy (Signed)
[facility] Physical Therapy [address] [place], [place], [address] Phone: [phone]   Fax:  [phone]  Physical Therapy Evaluation  Patient Details  Name: Kenneth Wiggins MRN: [id number] Date of Birth: 02-Apr-1961 Referring Provider (PT): Aundra Dubin PA-C   Encounter Date: 04/17/2021   PT End of Session - 04/17/21 1728     Visit Number 1    Number of Visits 20    PT Start Time 1015    PT Stop Time 9735    PT Time Calculation (min) 38 min    Activity Tolerance Patient tolerated treatment well;No increased pain    Behavior During Therapy WFL for tasks assessed/performed             Past Medical History:  Diagnosis Date   Asthma as child   hx of   Cancer ([place])    prostate - psa 3.93 on 07/06/2012   GERD (gastroesophageal reflux disease)    hx of   H/O hiatal hernia 15 years ago   Hypertension    Prostate cancer ([place]) 08/09/12   Gleason 3+3=6, vol 20-25 gm   Prostate cancer ([place]) 01-10-13   Seed implant to be done due to Robotic surgery aborted   Sleep apnea    CPAP    Past Surgical History:  Procedure Laterality Date   CARPAL TUNNEL RELEASE Right 04/03/2021   Procedure: RIGHT CARPAL TUNNEL RELEASE;  Surgeon: Leandrew Koyanagi, MD;  Location: [place];  Service: Orthopedics;  Laterality: Right;   HERNIA REPAIR  as child   KNEE SURGERY Left as child   tore a ligament    MASS EXCISION Right 03/07/2021   Procedure: EXCISION MASS OF RIGHT CHEST;  Surgeon: Johnathan Hausen, MD;  [facility];  Service: General;  Laterality: Right;   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   RADIOACTIVE SEED IMPLANT N/A 01/17/2013   Procedure: RADIOACTIVE SEED IMPLANT;  Surgeon: Bernestine Amass, MD;  [facility];  Service: Urology;  Laterality: N/A;  W/ MURRAY    ROBOT ASSISTED LAPAROSCOPIC RADICAL PROSTATECTOMY N/A 11/24/2012   Procedure: Attempted ROBOTIC ASSISTED LAPAROSCOPIC RADICAL PROSTATECTOMY, Abandoned;  Surgeon: Bernestine Amass, MD;  [facility];  Service: Urology;  Laterality: N/A;   scrotum explotion Left    SHOULDER ARTHROSCOPY WITH ROTATOR CUFF REPAIR AND SUBACROMIAL DECOMPRESSION Right 04/03/2021   Procedure: RIGHT SHOULDER ARTHROSCOPY ROTATOR CUFF REPAIR, SUBACROMIAL DECOMPRESSION, EXTENSIVE DEBRIDEMENT;  Surgeon: Leandrew Koyanagi, MD;  Location: [facility];  Service: Orthopedics;  Laterality: Right;   testical growth removed     VASECTOMY      There were no vitals filed for this visit.    Subjective Assessment - 04/17/21 1028     Subjective Kenneth Wiggins has a R RTC repair 04/03/2021 (2 weeks ago today).  He also had a SLAP tear debridement, biceps tenodesis and R carpal tunnel release.  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    Pertinent History Previous L knee surgery, sleep apnea, HTN, prostate cancer    Limitations House hold activities;Lifting    Patient Stated Goals Return to normal R shoulder function, work and fishing    Currently in Pain? No/denies    Pain Score 0-No pain    Multiple Pain Sites No                [facility] PT Assessment - 04/17/21 0001       Assessment  Medical Diagnosis R RTC repair    Referring Provider (PT) Aundra Dubin PA-C    Onset Date/Surgical Date 04/03/21    Next MD Visit 04/18/2021      Precautions   Precaution Comments SLAP tear, R carpal tunnel release and biceps tenodesis along with R RTC repair      Balance Screen   Has the patient fallen in the past 6 months No    Has the patient had a decrease in activity level because of a fear of falling?  No    Is the patient reluctant to leave their home because of a fear of falling?  No      Home Ecologist residence      Prior Function   Vocation Full time employment    Vocation Requirements Truck driver    Leisure Fishing      Cognition   Overall Cognitive Status Within Functional Limits for tasks assessed      Observation/Other Assessments    Focus on Therapeutic Outcomes (FOTO)  13 (Goal 57 at visit 20)      ROM / Strength   AROM / PROM / Strength AROM      AROM   Overall AROM  Deficits    AROM Assessment Site Shoulder    Right/Left Shoulder Right    Right Shoulder Flexion 140 Degrees    Right Shoulder Internal Rotation 60 Degrees    Right Shoulder External Rotation 80 Degrees                        Objective measurements completed on examination: See above findings.       [place] Adult PT Treatment/Exercise - 04/17/21 0001       Therapeutic Activites    Therapeutic Activities Other Therapeutic Activities    Other Therapeutic Activities Reviewed imaging, anatomy, surgical report and starter HEP.  Discussed pace of rehabilitation and expected return to certain functional activities.      Exercises   Exercises Shoulder      Shoulder Exercises: Standing   Other Standing Exercises Codmans forward and back; CW; CCW      Manual Therapy   Manual Therapy Passive ROM    Passive ROM IR, ER to comfort, flexion                    PT Education - 04/17/21 1727     Education Details Discussed shoulder anatomy, surgical report, expected outcomes including PT progressions over time and starter HEP.    Person(s) Educated Patient    Methods Explanation;Demonstration;Tactile cues;Verbal cues;Handout    Comprehension Verbal cues required;Need further instruction;Returned demonstration;Verbalized understanding;Tactile cues required              PT Short Term Goals - 04/17/21 1735       PT SHORT TERM GOAL #1   Title Improve R shoulder PROM for flexion to150; IR to 60 and ER to 90 degrees.    Baseline 140; 60; 80    Time 4    Period Weeks    Status New    Target Date 05/15/21               PT Long Term Goals - 04/17/21 1735       PT LONG TERM GOAL #1   Title Improve FOTO to 57.    Baseline 13    Time 10    Period Weeks    Status New  Target Date 06/26/21      PT LONG  TERM GOAL #2   Title Improve R shoulder AROM for flexion to 170; ER to 90; IR to 60 and horizontal adduction to 40 degrees (90% or better).    Baseline 140; 60; 80; deferred due to 2 weeks post-surgery    Time 10    Period Weeks    Status New    Target Date 06/26/21      PT LONG TERM GOAL #3   Title Improve R shoulder strength to 60% of the uninvolved L at 12 weeks post-surgery and 90% at 9 months post-surgery.    Baseline Deferred    Time 12    Period Weeks    Status New    Target Date 06/26/21      PT LONG TERM GOAL #4   Title Improve R shoulder pain to consistently 0-3/10 on the Numeric Pain Rating Scale.    Baseline Can be 4/10    Time 10    Period Weeks    Status New    Target Date 06/26/21      PT LONG TERM GOAL #5   Title Kenneth Wiggins will be independent and compliant with his long-term HEP at DC.    Time 10    Period Weeks    Status New    Target Date 06/26/21                    Plan - 04/17/21 1729     Clinical Impression Statement Kenneth Wiggins is doing very well post-surgery 2 weeks ago today.  PROM is better than expected and he is sleeping normally without presciption pain meds.  We discussed expectations (90-100% AROM 12 weeks post-surgery and 90% strength 9 months post-surgery), reviewed shoulder anatomy and his surgical report along with his starter HEP.  His prognosis to meet long-term goals is good.    Personal Factors and Comorbidities Comorbidity 3+    Comorbidities R CT release, sleep apnea, prostate cancer, previous L knee surgery, obesity    Examination-Activity Limitations Dressing;Hygiene/Grooming;Lift;Reach Overhead;Carry    Examination-Participation Restrictions Cleaning;Interpersonal Relationship;Occupation;Community Activity;Driving    Stability/Clinical Decision Making Stable/Uncomplicated    Clinical Decision Making Low    Rehab Potential Good    PT Frequency Other (comment)   1-2X/week   PT Duration Other (comment)   10 weeks   PT  Treatment/Interventions ADLs/Self Care Home Management;Cryotherapy;Therapeutic activities;Therapeutic exercise;Neuromuscular re-education;Patient/family education;Manual techniques;Passive range of motion;Vasopneumatic Device    PT Next Visit Plan PROM with extra care in ER (debrided SLAP tear).  Careful with biceps tenodesis and protect supraspinatus repair while improving PROM and AAROM within comfort.    PT Home Exercise Plan Access Code: [email or web address]  URL: [email or web address]  Date: 04/17/2021  Prepared by: Vista Mink    Exercises  Pendulums - 5 x daily - 7 x weekly - 1 sets - 30 reps    Consulted and Agree with Plan of Care Patient             Patient will benefit from skilled therapeutic intervention in order to improve the following deficits and impairments:  Decreased activity tolerance, Decreased endurance, Decreased range of motion, Decreased strength, Increased edema, Impaired flexibility, Impaired UE functional use, Obesity, Pain  Visit Diagnosis: Stiffness of left shoulder, not elsewhere classified  Localized edema  Muscle weakness (generalized)     Problem List Patient Active Problem List   Diagnosis Date Noted   Nontraumatic incomplete tear of right rotator cuff 03/12/2021  Carpal tunnel syndrome on right 01/07/2021   Prostate cancer ([place])     Farley Ly PT, MPT 04/17/2021, 5:40 PM  [facility] Physical Therapy [address] [place], [place], [address] Phone: [phone]   Fax:  [phone]  Name: Kenneth Wiggins MRN: [id number] Date of Birth: 1961/01/12

## 2021-04-18 ENCOUNTER — Encounter: Payer: Self-pay | Admitting: Orthopaedic Surgery

## 2021-04-18 ENCOUNTER — Ambulatory Visit (INDEPENDENT_AMBULATORY_CARE_PROVIDER_SITE_OTHER): Payer: 59 | Admitting: Orthopaedic Surgery

## 2021-04-18 DIAGNOSIS — Z9889 Other specified postprocedural states: Secondary | ICD-10-CM

## 2021-04-18 NOTE — Progress Notes (Signed)
Post-Op Visit Note   Patient: Kenneth Wiggins           Date of Birth: 1960/12/10           MRN: [id number] Visit Date: 04/18/2021 PCP: Deland Pretty, MD   Assessment & Plan:  Chief Complaint:  Chief Complaint  Patient presents with   Right Hand - Pain, Follow-up   Visit Diagnoses:  1. S/P right rotator cuff repair   2. S/P carpal tunnel release     Plan: Kenneth Wiggins returns today for suture removal from the right hand for recent carpal tunnel release.  He is overall doing well has no complaints.  He continues to feel some numbness in the index and thumb but overall the carpal tunnel symptoms have become milder and he is sleeping much better.  The carpal tunnel surgical incision is healed.  No signs of infection or drainage.  Neurovascular intact.  Sutures removed today.  He will continue to have to wear his sling to protect the rotator cuff repair.  He has just started physical therapy for this.  We will recheck him in 4 weeks.  Follow-Up Instructions: Return in about 4 weeks (around 05/16/2021).   Orders:  No orders of the defined types were placed in this encounter.  No orders of the defined types were placed in this encounter.   Imaging: No results found.  PMFS History: Patient Active Problem List   Diagnosis Date Noted   Nontraumatic incomplete tear of right rotator cuff 03/12/2021   Carpal tunnel syndrome on right 01/07/2021   Prostate cancer [facility])    Past Medical History:  Diagnosis Date   Asthma as child   hx of   Cancer ([place])    prostate - psa 3.93 on 07/06/2012   GERD (gastroesophageal reflux disease)    hx of   H/O hiatal hernia 15 years ago   Hypertension    Prostate cancer ([place]) 08/09/12   Gleason 3+3=6, vol 20-25 gm   Prostate cancer ([place]) 01-10-13   Seed implant to be done due to Robotic surgery aborted   Sleep apnea    CPAP    Family History  Problem Relation Age of Onset   Cancer Maternal Aunt        colon   Cancer Paternal Uncle         prostate, 2 uncles   Cancer Daughter        angiosarcoma age 67   Prostate cancer Other     Past Surgical History:  Procedure Laterality Date   CARPAL TUNNEL RELEASE Right 04/03/2021   Procedure: RIGHT CARPAL TUNNEL RELEASE;  Surgeon: Leandrew Koyanagi, MD;  Location: [facility];  Service: Orthopedics;  Laterality: Right;   HERNIA REPAIR  as child   KNEE SURGERY Left as child   tore a ligament    MASS EXCISION Right 03/07/2021   Procedure: EXCISION MASS OF RIGHT CHEST;  Surgeon: Johnathan Hausen, MD;  [facility];  Service: General;  Laterality: Right;   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   RADIOACTIVE SEED IMPLANT N/A 01/17/2013   Procedure: RADIOACTIVE SEED IMPLANT;  Surgeon: Bernestine Amass, MD;  [facility];  Service: Urology;  Laterality: N/A;  W/ MURRAY    ROBOT ASSISTED LAPAROSCOPIC RADICAL PROSTATECTOMY N/A 11/24/2012   Procedure: Attempted ROBOTIC ASSISTED LAPAROSCOPIC RADICAL PROSTATECTOMY, Abandoned;  Surgeon: Bernestine Amass, MD;  [facility];  Service: Urology;  Laterality: N/A;   scrotum explotion Left  SHOULDER ARTHROSCOPY WITH ROTATOR CUFF REPAIR AND SUBACROMIAL DECOMPRESSION Right 04/03/2021   Procedure: RIGHT SHOULDER ARTHROSCOPY ROTATOR CUFF REPAIR, SUBACROMIAL DECOMPRESSION, EXTENSIVE DEBRIDEMENT;  Surgeon: Leandrew Koyanagi, MD;  Location: [facility];  Service: Orthopedics;  Laterality: Right;   testical growth removed     VASECTOMY     Social History   Occupational History   Occupation: TRUCK DRIVER     Employer: [employer]  Tobacco Use   Smoking status: Never   Smokeless tobacco: Never  Vaping Use   Vaping Use: Never used  Substance and Sexual Activity   Alcohol use: No   Drug use: No   Sexual activity: Yes

## 2021-04-22 ENCOUNTER — Ambulatory Visit (INDEPENDENT_AMBULATORY_CARE_PROVIDER_SITE_OTHER): Payer: 59 | Admitting: Physical Therapy

## 2021-04-22 ENCOUNTER — Encounter: Payer: Self-pay | Admitting: Physical Therapy

## 2021-04-22 ENCOUNTER — Other Ambulatory Visit: Payer: Self-pay

## 2021-04-22 DIAGNOSIS — G8929 Other chronic pain: Secondary | ICD-10-CM

## 2021-04-22 DIAGNOSIS — M25511 Pain in right shoulder: Secondary | ICD-10-CM | POA: Diagnosis not present

## 2021-04-22 DIAGNOSIS — R600 Localized edema: Secondary | ICD-10-CM

## 2021-04-22 DIAGNOSIS — M6281 Muscle weakness (generalized): Secondary | ICD-10-CM | POA: Diagnosis not present

## 2021-04-22 DIAGNOSIS — M25612 Stiffness of left shoulder, not elsewhere classified: Secondary | ICD-10-CM | POA: Diagnosis not present

## 2021-04-22 NOTE — Therapy (Signed)
[facility] [address] [place], [place], [address] Phone: [phone]   Fax:  [phone]  Physical Therapy Treatment  Patient Details  Name: Kenneth Wiggins MRN: [id number] Date of Birth: [birth year] Referring Provider (PT): Kenneth Dubin PA-C   Encounter Date: 04/22/2021   PT End of Session - 04/22/21 0957     Visit Number 2    Number of Visits 20    PT Start Time 0930    PT Stop Time 4401   session limited due to post op precautions   PT Time Calculation (min) 25 min    Activity Tolerance Patient tolerated treatment well;No increased pain    Behavior During Therapy WFL for tasks assessed/performed             Past Medical History:  Diagnosis Date   Asthma as child   hx of   Cancer (Kenneth Wiggins)    prostate - psa 3.93 on 07/06/2012   GERD (gastroesophageal reflux disease)    hx of   H/O hiatal hernia 15 years ago   Hypertension    Prostate cancer ([place]) 08/09/12   Gleason 3+3=6, vol 20-25 gm   Prostate cancer ([place]) 01-10-13   Seed implant to be done due to Robotic surgery aborted   Sleep apnea    CPAP    Past Surgical History:  Procedure Laterality Date   CARPAL TUNNEL RELEASE Right 04/03/2021   Procedure: RIGHT CARPAL TUNNEL RELEASE;  Surgeon: Kenneth Koyanagi, MD;  Location: [place];  Service: Orthopedics;  Laterality: Right;   HERNIA REPAIR  as child   KNEE SURGERY Left as child   tore a ligament    MASS EXCISION Right 03/07/2021   Procedure: EXCISION MASS OF RIGHT CHEST;  Surgeon: Kenneth Hausen, MD;  [facility];  Service: General;  Laterality: Right;   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   RADIOACTIVE SEED IMPLANT N/A 01/17/2013   Procedure: RADIOACTIVE SEED IMPLANT;  Surgeon: Kenneth Amass, MD;  [facility];  Service: Urology;  Laterality: N/A;  W/ Kenneth Wiggins    ROBOT ASSISTED LAPAROSCOPIC RADICAL PROSTATECTOMY N/A 11/24/2012   Procedure: Attempted ROBOTIC ASSISTED LAPAROSCOPIC RADICAL PROSTATECTOMY, Abandoned;   Surgeon: Kenneth Amass, MD;  [facility];  Service: Urology;  Laterality: N/A;   scrotum explotion Left    SHOULDER ARTHROSCOPY WITH ROTATOR CUFF REPAIR AND SUBACROMIAL DECOMPRESSION Right 04/03/2021   Procedure: RIGHT SHOULDER ARTHROSCOPY ROTATOR CUFF REPAIR, SUBACROMIAL DECOMPRESSION, EXTENSIVE DEBRIDEMENT;  Surgeon: Kenneth Koyanagi, MD;  Location: [place];  Service: Orthopedics;  Laterality: Right;   testical growth removed     VASECTOMY      There were no vitals filed for this visit.   Subjective Assessment - 04/22/21 0932     Subjective shoulder is doing well - no real pain, just some aching in the morning    Pertinent History Previous L knee surgery, sleep apnea, HTN, prostate cancer    Limitations House hold activities;Lifting    Patient Stated Goals Return to normal R shoulder function, work and fishing    Currently in Pain? No/denies                               [facility] Adult PT Treatment/Exercise - 04/22/21 0935       Shoulder Exercises: ROM/Strengthening   Pendulum Rt shoulder: fwd/bwd, lateral, and circles x 2 min each direction      Manual Therapy  Manual Therapy Passive ROM    Passive ROM Rt shoulder all motions to tolerance in supine                      PT Short Term Goals - 04/17/21 1735       PT SHORT TERM GOAL #1   Title Improve R shoulder PROM for flexion to150; IR to 60 and ER to 90 degrees.    Baseline 140; 60; 80    Time 4    Period Weeks    Status New    Target Date 05/15/21               PT Long Term Goals - 04/17/21 1735       PT LONG TERM GOAL #1   Title Improve FOTO to 57.    Baseline 13    Time 10    Period Weeks    Status New    Target Date 06/26/21      PT LONG TERM GOAL #2   Title Improve R shoulder AROM for flexion to 170; ER to 90; IR to 60 and horizontal adduction to 40 degrees (90% or better).    Baseline 140; 60; 80; deferred due to 2 weeks post-surgery    Time 10    Period Weeks     Status New    Target Date 06/26/21      PT LONG TERM GOAL #3   Title Improve R shoulder strength to 60% of the uninvolved L at 12 weeks post-surgery and 90% at 9 months post-surgery.    Baseline Deferred    Time 12    Period Weeks    Status New    Target Date 06/26/21      PT LONG TERM GOAL #4   Title Improve R shoulder pain to consistently 0-3/10 on the Numeric Pain Rating Scale.    Baseline Can be 4/10    Time 10    Period Weeks    Status New    Target Date 06/26/21      PT LONG TERM GOAL #5   Title Kenneth Wiggins will be independent and compliant with his long-term HEP at DC.    Time 10    Period Weeks    Status New    Target Date 06/26/21                   Plan - 04/22/21 0958     Clinical Impression Statement Pt tolerated session well today and still demonstrating great initial PROM today.  Will continue to benefit from PT to maximize function.    Personal Factors and Comorbidities Comorbidity 3+    Comorbidities R CT release, sleep apnea, prostate cancer, previous L knee surgery, obesity    Examination-Activity Limitations Dressing;Hygiene/Grooming;Lift;Reach Overhead;Carry    Examination-Participation Restrictions Cleaning;Interpersonal Relationship;Occupation;Community Activity;Driving    Stability/Clinical Decision Making Stable/Uncomplicated    Rehab Potential Good    PT Frequency Other (comment)   1-2X/week   PT Duration Other (comment)   10 weeks   PT Treatment/Interventions ADLs/Self Care Home Management;Cryotherapy;Therapeutic activities;Therapeutic exercise;Neuromuscular re-education;Patient/family education;Manual techniques;Passive range of motion;Vasopneumatic Device    PT Next Visit Plan PROM with extra care in ER (debrided SLAP tear).  protect supraspinatus repair while improving PROM and AAROM within comfort.    PT Home Exercise Plan Access Code: [email or web address]  URL: [email or web address]  Date: 04/17/2021  Prepared by: Kenneth Wiggins    Exercises   Pendulums - 5 x daily - 7 x  weekly - 1 sets - 30 reps    Consulted and Agree with Plan of Care Patient             Patient will benefit from skilled therapeutic intervention in order to improve the following deficits and impairments:  Decreased activity tolerance, Decreased endurance, Decreased range of motion, Decreased strength, Increased edema, Impaired flexibility, Impaired UE functional use, Obesity, Pain  Visit Diagnosis: Stiffness of left shoulder, not elsewhere classified  Localized edema  Muscle weakness (generalized)  Chronic right shoulder pain     Problem List Patient Active Problem List   Diagnosis Date Noted   Nontraumatic incomplete tear of right rotator cuff 03/12/2021   Carpal tunnel syndrome on right 01/07/2021   Prostate cancer ([place])       Laureen Abrahams, PT, DPT 04/22/21 10:13 AM     [facility] [address] [place], [place], [address] Phone: [phone]   Fax:  [phone]  Name: Kenneth Wiggins MRN: [id number] Date of Birth: 05/28/1961

## 2021-04-24 ENCOUNTER — Ambulatory Visit (INDEPENDENT_AMBULATORY_CARE_PROVIDER_SITE_OTHER): Payer: 59 | Admitting: Rehabilitative and Restorative Service Providers"

## 2021-04-24 ENCOUNTER — Encounter: Payer: Self-pay | Admitting: Rehabilitative and Restorative Service Providers"

## 2021-04-24 ENCOUNTER — Other Ambulatory Visit: Payer: Self-pay

## 2021-04-24 DIAGNOSIS — M6281 Muscle weakness (generalized): Secondary | ICD-10-CM

## 2021-04-24 DIAGNOSIS — R600 Localized edema: Secondary | ICD-10-CM

## 2021-04-24 DIAGNOSIS — M25611 Stiffness of right shoulder, not elsewhere classified: Secondary | ICD-10-CM | POA: Diagnosis not present

## 2021-04-24 DIAGNOSIS — M25511 Pain in right shoulder: Secondary | ICD-10-CM | POA: Diagnosis not present

## 2021-04-24 DIAGNOSIS — G8929 Other chronic pain: Secondary | ICD-10-CM

## 2021-04-24 NOTE — Patient Instructions (Addendum)
Access Code: [email or web address] URL: [email or web address] Date: 04/24/2021 Prepared by: Vista Mink  Exercises Pendulums - 5 x daily - 7 x weekly - 1 sets - 30 reps Standing Scapular Retraction - 5 x daily - 7 x weekly - 1 sets - 5 reps - 5 second hold Supine Scapular Protraction in Flexion with Dumbbells - 2 x daily - 7 x weekly - 1 sets - 20 reps - 3 seconds hold

## 2021-04-24 NOTE — Therapy (Signed)
[facility] [address] [place], [place], [address] Phone: [phone]   Fax:  [phone]  Physical Therapy Treatment  Patient Details  Name: Kenneth Wiggins MRN: [id number] Date of Birth: Dec 20, 1960 Referring Provider (PT): Cristie Hem PA-C   Encounter Date: 04/24/2021   PT End of Session - 04/24/21 1408     Visit Number 3    Number of Visits 20    PT Start Time 1147    PT Stop Time 1227    PT Time Calculation (min) 40 min    Activity Tolerance Patient tolerated treatment well;No increased pain    Behavior During Therapy WFL for tasks assessed/performed             Past Medical History:  Diagnosis Date   Asthma as child   hx of   Cancer ([facility])    prostate - psa 3.93 on 07/06/2012   GERD (gastroesophageal reflux disease)    hx of   H/O hiatal hernia 15 years ago   Hypertension    Prostate cancer ([facility]) 08/09/12   Gleason 3+3=6, vol 20-25 gm   Prostate cancer ([facility]) 01-10-13   Seed implant to be done due to Robotic surgery aborted   Sleep apnea    CPAP    Past Surgical History:  Procedure Laterality Date   CARPAL TUNNEL RELEASE Right 04/03/2021   Procedure: RIGHT CARPAL TUNNEL RELEASE;  Surgeon: Tarry Kos, MD;  [facility];  Service: Orthopedics;  Laterality: Right;   HERNIA REPAIR  as child   KNEE SURGERY Left as child   tore a ligament    MASS EXCISION Right 03/07/2021   Procedure: EXCISION MASS OF RIGHT CHEST;  Surgeon: Luretha Murphy, MD;  [facility];  Service: General;  Laterality: Right;   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   RADIOACTIVE SEED IMPLANT N/A 01/17/2013   Procedure: RADIOACTIVE SEED IMPLANT;  Surgeon: Valetta Fuller, MD;  [facility];  Service: Urology;  Laterality: N/A;  W/ MURRAY    ROBOT ASSISTED LAPAROSCOPIC RADICAL PROSTATECTOMY N/A 11/24/2012   Procedure: Attempted ROBOTIC ASSISTED LAPAROSCOPIC RADICAL PROSTATECTOMY, Abandoned;  Surgeon: Valetta Fuller, MD;  [facility];  Service: Urology;  Laterality: N/A;   scrotum explotion Left    SHOULDER ARTHROSCOPY WITH ROTATOR CUFF REPAIR AND SUBACROMIAL DECOMPRESSION Right 04/03/2021   Procedure: RIGHT SHOULDER ARTHROSCOPY ROTATOR CUFF REPAIR, SUBACROMIAL DECOMPRESSION, EXTENSIVE DEBRIDEMENT;  Surgeon: Tarry Kos, MD;  [facility];  Service: Orthopedics;  Laterality: Right;   testical growth removed     VASECTOMY      There were no vitals filed for this visit.   Subjective Assessment - 04/24/21 1404     Subjective Seng reports 2X/day HEP compliance.  3-5X/day is recommended.  Sleep is uninterrupted and pain is low.    Pertinent History Previous L knee surgery, sleep apnea, HTN, prostate cancer    Limitations House hold activities;Lifting    Patient Stated Goals Return to normal R shoulder function, work and fishing    Currently in Pain? No/denies    Pain Score 0-No pain    Multiple Pain Sites No                [facility] PT Assessment - 04/24/21 0001       ROM / Strength   AROM / PROM / Strength PROM      AROM   Overall AROM  Deficits    AROM Assessment Site Shoulder  Right/Left Shoulder Right    Right Shoulder Flexion 140 Degrees    Right Shoulder Internal Rotation 65 Degrees    Right Shoulder External Rotation 85 Degrees                           [facility] Adult PT Treatment/Exercise - 04/24/21 0001       Exercises   Exercises Shoulder      Shoulder Exercises: Supine   Protraction AROM;Right;20 reps;Limitations    Protraction Limitations 3 seconds (reach up towards ceiling with palm in, keep elbow straight)      Shoulder Exercises: Seated   Retraction Strengthening;Both;10 reps;Limitations    Retraction Limitations 5 seconds (shoulder blade pinches)      Shoulder Exercises: Standing   Other Standing Exercises Codmans forward and back; CW; CCW      Shoulder Exercises: ROM/Strengthening   Pendulum Forward/Back; CW; CCW      Manual Therapy   Manual Therapy  Passive ROM    Passive ROM IR, ER to comfort, flexion                    PT Education - 04/24/21 1405     Education Details Reviewed HEP and precautions post-surgery.  Added scapular strength activities.    Person(s) Educated Patient    Methods Explanation;Demonstration;Tactile cues;Verbal cues;Handout    Comprehension Verbal cues required;Returned demonstration;Need further instruction;Tactile cues required;Verbalized understanding              PT Short Term Goals - 04/24/21 1407       PT SHORT TERM GOAL #1   Title Improve R shoulder PROM for flexion to150; IR to 60 and ER to 90 degrees.    Baseline 140; 60; 80 at eval    Time 4    Period Weeks    Status Partially Met    Target Date 05/15/21               PT Long Term Goals - 04/24/21 1407       PT LONG TERM GOAL #1   Title Improve FOTO to 57.    Baseline 13    Time 10    Period Weeks    Status On-going      PT LONG TERM GOAL #2   Title Improve R shoulder AROM for flexion to 170; ER to 90; IR to 60 and horizontal adduction to 40 degrees (90% or better).    Baseline 140; 65; 85; deferred due to 2 weeks post-surgery    Time 10    Period Weeks    Status Partially Met      PT LONG TERM GOAL #3   Title Improve R shoulder strength to 60% of the uninvolved L at 12 weeks post-surgery and 90% at 9 months post-surgery.    Baseline Deferred    Time 12    Period Weeks    Status On-going      PT LONG TERM GOAL #4   Title Improve R shoulder pain to consistently 0-3/10 on the Numeric Pain Rating Scale.    Baseline Low pain    Time 10    Period Weeks    Status Achieved      PT LONG TERM GOAL #5   Title Dewan will be independent and compliant with his long-term HEP at DC.    Time 10    Period Weeks    Status On-going  Plan - 04/24/21 1409     Clinical Impression Statement Argel is moving great for 3 weeks post-RTC repair.  Given his SLAP tear and biceps tenodesis along  with his RTC repair, he will continue to benefit from a mostly passive, AAROM approach to allow for proper healing.  AROM within comfort is also appropriate.  Consider dropping Hersel to 1X/week to make sure he doesn't tighten up while saving visits for when more strength progressions are appropriate 8-10 weeks post-surgery.    Personal Factors and Comorbidities Comorbidity 3+    Comorbidities R CT release, sleep apnea, prostate cancer, previous L knee surgery, obesity    Examination-Activity Limitations Dressing;Hygiene/Grooming;Lift;Reach Overhead;Carry    Examination-Participation Restrictions Cleaning;Interpersonal Relationship;Occupation;Community Activity;Driving    Stability/Clinical Decision Making Stable/Uncomplicated    Rehab Potential Good    PT Frequency Other (comment)   1-2X/week   PT Duration Other (comment)   10 weeks   PT Treatment/Interventions ADLs/Self Care Home Management;Cryotherapy;Therapeutic activities;Therapeutic exercise;Neuromuscular re-education;Patient/family education;Manual techniques;Passive range of motion;Vasopneumatic Device    PT Next Visit Plan PROM with extra care in ER (debrided SLAP tear).  protect supraspinatus repair while improving PROM and AAROM within comfort.    PT Home Exercise Plan Access Code: [email or web address]  URL: [email or web address]  Date: 04/17/2021  Prepared by: Vista Mink    Exercises  Pendulums - 5 x daily - 7 x weekly - 1 sets - 30 reps    Consulted and Agree with Plan of Care Patient             Patient will benefit from skilled therapeutic intervention in order to improve the following deficits and impairments:  Decreased activity tolerance, Decreased endurance, Decreased range of motion, Decreased strength, Increased edema, Impaired flexibility, Impaired UE functional use, Obesity, Pain  Visit Diagnosis: Localized edema  Muscle weakness (generalized)  Stiffness of right shoulder, not elsewhere classified  Chronic  right shoulder pain     Problem List Patient Active Problem List   Diagnosis Date Noted   Nontraumatic incomplete tear of right rotator cuff 03/12/2021   Carpal tunnel syndrome on right 01/07/2021   Prostate cancer ([place])     Farley Ly PT, MPT 04/24/2021, 2:12 PM  [facility] Physical Therapy [address] [place], [place], [address] Phone: [phone]   Fax:  [phone]  Name: LYNK MARTI MRN: [id number] Date of Birth: 1961-05-09

## 2021-04-29 ENCOUNTER — Telehealth: Payer: Self-pay | Admitting: Orthopaedic Surgery

## 2021-04-29 NOTE — Telephone Encounter (Signed)
Received $25.00 cash,medical records release form and disability paperwork from patient/Forwarding to [employer] today  

## 2021-05-01 ENCOUNTER — Encounter: Payer: Self-pay | Admitting: Rehabilitative and Restorative Service Providers"

## 2021-05-01 ENCOUNTER — Other Ambulatory Visit: Payer: Self-pay

## 2021-05-01 ENCOUNTER — Ambulatory Visit (INDEPENDENT_AMBULATORY_CARE_PROVIDER_SITE_OTHER): Payer: 59 | Admitting: Rehabilitative and Restorative Service Providers"

## 2021-05-01 DIAGNOSIS — M25611 Stiffness of right shoulder, not elsewhere classified: Secondary | ICD-10-CM

## 2021-05-01 DIAGNOSIS — M6281 Muscle weakness (generalized): Secondary | ICD-10-CM | POA: Diagnosis not present

## 2021-05-01 DIAGNOSIS — M25511 Pain in right shoulder: Secondary | ICD-10-CM | POA: Diagnosis not present

## 2021-05-01 DIAGNOSIS — R600 Localized edema: Secondary | ICD-10-CM | POA: Diagnosis not present

## 2021-05-01 DIAGNOSIS — G8929 Other chronic pain: Secondary | ICD-10-CM

## 2021-05-01 NOTE — Patient Instructions (Signed)
Access Code: [email or web address] URL: [email or web address] Date: 05/01/2021 Prepared by: Vista Mink  Exercises Pendulums - 5 x daily - 7 x weekly - 1 sets - 30 reps Standing Scapular Retraction - 5 x daily - 7 x weekly - 1 sets - 5 reps - 5 second hold Supine Scapular Protraction in Flexion with Dumbbells - 2 x daily - 7 x weekly - 1 sets - 20 reps - 3 seconds hold Supine Shoulder Internal Rotation Stretch - 2 x daily - 7 x weekly - 1 sets - 10 reps - 10 seconds hold Supine Shoulder External Rotation Stretch - 2 x daily - 7 x weekly - 1 sets - 10 reps - 10 seconds hold

## 2021-05-01 NOTE — Therapy (Signed)
[facility] Physical Therapy [address] [place], [place], [address] Phone: [phone]   Fax:  [phone]  Physical Therapy Treatment  Patient Details  Name: Kenneth Wiggins MRN: [id number] Date of Birth: 09/01/1961 Referring Provider (PT): Aundra Dubin PA-C   Encounter Date: 05/01/2021   PT End of Session - 05/01/21 1407     Visit Number 4    Number of Visits 20    PT Start Time 2263    PT Stop Time 1225    PT Time Calculation (min) 40 min    Activity Tolerance Patient tolerated treatment well;No increased pain    Behavior During Therapy WFL for tasks assessed/performed             Past Medical History:  Diagnosis Date   Asthma as child   hx of   Cancer ([place])    prostate - psa 3.93 on 07/06/2012   GERD (gastroesophageal reflux disease)    hx of   H/O hiatal hernia 15 years ago   Hypertension    Prostate cancer ([place]) 08/09/12   Gleason 3+3=6, vol 20-25 gm   Prostate cancer ([place]) 01-10-13   Seed implant to be done due to Robotic surgery aborted   Sleep apnea    CPAP    Past Surgical History:  Procedure Laterality Date   CARPAL TUNNEL RELEASE Right 04/03/2021   Procedure: RIGHT CARPAL TUNNEL RELEASE;  Surgeon: Leandrew Koyanagi, MD;  Location: [place];  Service: Orthopedics;  Laterality: Right;   HERNIA REPAIR  as child   KNEE SURGERY Left as child   tore a ligament    MASS EXCISION Right 03/07/2021   Procedure: EXCISION MASS OF RIGHT CHEST;  Surgeon: Johnathan Hausen, MD;  [facility];  Service: General;  Laterality: Right;   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   RADIOACTIVE SEED IMPLANT N/A 01/17/2013   Procedure: RADIOACTIVE SEED IMPLANT;  Surgeon: Bernestine Amass, MD;  [facility];  Service: Urology;  Laterality: N/A;  W/ MURRAY    ROBOT ASSISTED LAPAROSCOPIC RADICAL PROSTATECTOMY N/A 11/24/2012   Procedure: Attempted ROBOTIC ASSISTED LAPAROSCOPIC RADICAL PROSTATECTOMY, Abandoned;  Surgeon: Bernestine Amass, MD;  [facility];  Service: Urology;  Laterality: N/A;   scrotum explotion Left    SHOULDER ARTHROSCOPY WITH ROTATOR CUFF REPAIR AND SUBACROMIAL DECOMPRESSION Right 04/03/2021   Procedure: RIGHT SHOULDER ARTHROSCOPY ROTATOR CUFF REPAIR, SUBACROMIAL DECOMPRESSION, EXTENSIVE DEBRIDEMENT;  Surgeon: Leandrew Koyanagi, MD;  Location: [place];  Service: Orthopedics;  Laterality: Right;   testical growth removed     VASECTOMY      There were no vitals filed for this visit.   Subjective Assessment - 05/01/21 1150     Subjective Kenneth Wiggins reports 2-3X/day HEP compliance.  3-5X/day is recommended.  Sleep is uninterrupted and pain is low.    Pertinent History Previous L knee surgery, sleep apnea, HTN, prostate cancer    Limitations House hold activities;Lifting    Patient Stated Goals Return to normal R shoulder function, work and fishing    Currently in Pain? No/denies    Pain Score 0-No pain    Multiple Pain Sites No                               [facility] Adult PT Treatment/Exercise - 05/01/21 0001       Exercises   Exercises Shoulder      Shoulder Exercises: Supine   Protraction  AROM;Right;20 reps;Limitations    Protraction Weight (lbs) 1    Protraction Limitations 3 seconds (reach up towards ceiling with palm in, keep elbow straight)    External Rotation AROM;Right;10 reps;Limitations    External Rotation Limitations 10 seconds    Internal Rotation AROM;Right;10 reps;Other (comment)    Internal Rotation Limitations 10 seconds      Shoulder Exercises: Seated   Retraction Strengthening;Both;10 reps;Limitations    Retraction Limitations 5 seconds (shoulder blade pinches)      Shoulder Exercises: Standing   Other Standing Exercises Codmans forward and back; CW; CCW      Shoulder Exercises: ROM/Strengthening   Pendulum Forward/Back; CW; CCW      Manual Therapy   Manual Therapy Passive ROM    Passive ROM IR, ER to comfort, flexion                    PT Education - 05/01/21  1406     Education Details Added 2 new AROM stretches to his HEP.  Made pendulums optional.    Person(s) Educated Patient    Methods Explanation;Demonstration;Tactile cues;Verbal cues;Handout    Comprehension Verbal cues required;Need further instruction;Returned demonstration;Tactile cues required;Verbalized understanding              PT Short Term Goals - 04/24/21 1407       PT SHORT TERM GOAL #1   Title Improve R shoulder PROM for flexion to150; IR to 60 and ER to 90 degrees.    Baseline 140; 60; 80 at eval    Time 4    Period Weeks    Status Partially Met    Target Date 05/15/21               PT Long Term Goals - 04/24/21 1407       PT LONG TERM GOAL #1   Title Improve FOTO to 57.    Baseline 13    Time 10    Period Weeks    Status On-going      PT LONG TERM GOAL #2   Title Improve R shoulder AROM for flexion to 170; ER to 90; IR to 60 and horizontal adduction to 40 degrees (90% or better).    Baseline 140; 65; 85; deferred due to 2 weeks post-surgery    Time 10    Period Weeks    Status Partially Met      PT LONG TERM GOAL #3   Title Improve R shoulder strength to 60% of the uninvolved L at 12 weeks post-surgery and 90% at 9 months post-surgery.    Baseline Deferred    Time 12    Period Weeks    Status On-going      PT LONG TERM GOAL #4   Title Improve R shoulder pain to consistently 0-3/10 on the Numeric Pain Rating Scale.    Baseline Low pain    Time 10    Period Weeks    Status Achieved      PT LONG TERM GOAL #5   Title [name] will be independent and compliant with his long-term HEP at DC.    Time 10    Period Weeks    Status On-going                   Plan - 05/01/21 1407     Clinical Impression Statement Kenneth Wiggins has great AROM, little pain and is sleeping uninterrupted 4 weeks post-surgery.  Added supine IR/ER stretch to maintain his excellent  early AROM.  Continue to allow RTC healing while avoiding capsular tightness and  progressing scapular strength.    Personal Factors and Comorbidities Comorbidity 3+    Comorbidities R CT release, sleep apnea, prostate cancer, previous L knee surgery, obesity    Examination-Activity Limitations Dressing;Hygiene/Grooming;Lift;Reach Overhead;Carry    Examination-Participation Restrictions Cleaning;Interpersonal Relationship;Occupation;Community Activity;Driving    Stability/Clinical Decision Making Stable/Uncomplicated    Rehab Potential Good    PT Frequency Other (comment)   1-2X/week   PT Duration Other (comment)   10 weeks   PT Treatment/Interventions ADLs/Self Care Home Management;Cryotherapy;Therapeutic activities;Therapeutic exercise;Neuromuscular re-education;Patient/family education;Manual techniques;Passive range of motion;Vasopneumatic Device    PT Next Visit Plan AROM (overhead) and anti-gravity light (no resistance) strengthening.    PT Home Exercise Plan Access Code: [email or web address]  URL: [email or web address]  Date: 04/17/2021  Prepared by: Vista Mink    Exercises  Pendulums - 5 x daily - 7 x weekly - 1 sets - 30 reps    Consulted and Agree with Plan of Care Patient             Patient will benefit from skilled therapeutic intervention in order to improve the following deficits and impairments:  Decreased activity tolerance, Decreased endurance, Decreased range of motion, Decreased strength, Increased edema, Impaired flexibility, Impaired UE functional use, Obesity, Pain  Visit Diagnosis: Muscle weakness (generalized)  Stiffness of right shoulder, not elsewhere classified  Chronic right shoulder pain  Localized edema     Problem List Patient Active Problem List   Diagnosis Date Noted   Nontraumatic incomplete tear of right rotator cuff 03/12/2021   Carpal tunnel syndrome on right 01/07/2021   Prostate cancer ([place])     Farley Ly PT, MPT 05/01/2021, 2:10 PM  [facility] Physical Therapy [address] [place], [place], [address] Phone: [phone]   Fax:  [phone]  Name: Kenneth Wiggins MRN: [id number] Date of Birth: [birth year]

## 2021-05-08 ENCOUNTER — Encounter: Payer: Self-pay | Admitting: Rehabilitative and Restorative Service Providers"

## 2021-05-08 ENCOUNTER — Ambulatory Visit (INDEPENDENT_AMBULATORY_CARE_PROVIDER_SITE_OTHER): Payer: 59 | Admitting: Rehabilitative and Restorative Service Providers"

## 2021-05-08 ENCOUNTER — Other Ambulatory Visit: Payer: Self-pay

## 2021-05-08 DIAGNOSIS — M6281 Muscle weakness (generalized): Secondary | ICD-10-CM

## 2021-05-08 DIAGNOSIS — M25611 Stiffness of right shoulder, not elsewhere classified: Secondary | ICD-10-CM | POA: Diagnosis not present

## 2021-05-08 DIAGNOSIS — R600 Localized edema: Secondary | ICD-10-CM

## 2021-05-08 DIAGNOSIS — G8929 Other chronic pain: Secondary | ICD-10-CM

## 2021-05-08 DIAGNOSIS — M25511 Pain in right shoulder: Secondary | ICD-10-CM

## 2021-05-08 NOTE — Therapy (Signed)
[facility] [address] [place], [place], [address] Phone: [phone]   Fax:  [phone]  Physical Therapy Treatment  Patient Details  Name: Kenneth Wiggins MRN: [id number] Date of Birth: 1960/12/26 Referring Provider (PT): Aundra Dubin PA-C   Encounter Date: 05/08/2021   PT End of Session - 05/08/21 1239     Visit Number 5    Number of Visits 20    PT Start Time 9211    PT Stop Time 9417    PT Time Calculation (min) 39 min    Activity Tolerance Patient tolerated treatment well;No increased pain    Behavior During Therapy WFL for tasks assessed/performed             Past Medical History:  Diagnosis Date   Asthma as child   hx of   Cancer ([place])    prostate - psa 3.93 on 07/06/2012   GERD (gastroesophageal reflux disease)    hx of   H/O hiatal hernia 15 years ago   Hypertension    Prostate cancer ([place]) 08/09/12   Gleason 3+3=6, vol 20-25 gm   Prostate cancer ([place]) 01-10-13   Seed implant to be done due to Robotic surgery aborted   Sleep apnea    CPAP    Past Surgical History:  Procedure Laterality Date   CARPAL TUNNEL RELEASE Right 04/03/2021   Procedure: RIGHT CARPAL TUNNEL RELEASE;  Surgeon: Leandrew Koyanagi, MD;  Location: [place];  Service: Orthopedics;  Laterality: Right;   HERNIA REPAIR  as child   KNEE SURGERY Left as child   tore a ligament    MASS EXCISION Right 03/07/2021   Procedure: EXCISION MASS OF RIGHT CHEST;  Surgeon: Johnathan Hausen, MD;  [facility];  Service: General;  Laterality: Right;   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   RADIOACTIVE SEED IMPLANT N/A 01/17/2013   Procedure: RADIOACTIVE SEED IMPLANT;  Surgeon: Bernestine Amass, MD;  [facility];  Service: Urology;  Laterality: N/A;  W/ MURRAY    ROBOT ASSISTED LAPAROSCOPIC RADICAL PROSTATECTOMY N/A 11/24/2012   Procedure: Attempted ROBOTIC ASSISTED LAPAROSCOPIC RADICAL PROSTATECTOMY, Abandoned;  Surgeon: Bernestine Amass, MD;  [facility];  Service: Urology;  Laterality: N/A;   scrotum explotion Left    SHOULDER ARTHROSCOPY WITH ROTATOR CUFF REPAIR AND SUBACROMIAL DECOMPRESSION Right 04/03/2021   Procedure: RIGHT SHOULDER ARTHROSCOPY ROTATOR CUFF REPAIR, SUBACROMIAL DECOMPRESSION, EXTENSIVE DEBRIDEMENT;  Surgeon: Leandrew Koyanagi, MD;  Location: [place];  Service: Orthopedics;  Laterality: Right;   testical growth removed     VASECTOMY      There were no vitals filed for this visit.   Subjective Assessment - 05/08/21 1236     Subjective [name] is doing a good job with his HEP compliance.  He is sleeping uninterrupted and has little R shoulder pain.    Pertinent History Previous L knee surgery, sleep apnea, HTN, prostate cancer    Limitations House hold activities;Lifting    Patient Stated Goals Return to normal R shoulder function, work and fishing    Currently in Pain? No/denies    Pain Score 0-No pain    Multiple Pain Sites No                [facility] PT Assessment - 05/08/21 0001       ROM / Strength   AROM / PROM / Strength PROM      AROM   Overall AROM  Deficits    AROM Assessment  Site Shoulder    Right/Left Shoulder Right    Right Shoulder Flexion 150 Degrees    Right Shoulder Internal Rotation 70 Degrees    Right Shoulder External Rotation 85 Degrees                           [facility] Adult PT Treatment/Exercise - 05/08/21 0001       Exercises   Exercises Shoulder      Shoulder Exercises: Supine   Protraction AROM;Right;20 reps;Limitations    Protraction Weight (lbs) 2    Protraction Limitations 3 seconds (reach up towards ceiling with palm in, keep elbow straight)    External Rotation AROM;Right;10 reps;Limitations    External Rotation Limitations 10 seconds    Internal Rotation AROM;Right;10 reps;Other (comment)    Internal Rotation Limitations 10 seconds    Flexion AROM;Right;10 reps;Limitations    Flexion Limitations 5 seconds (protract 1st, elbow in by ear)      Shoulder  Exercises: Seated   Retraction Strengthening;Both;10 reps;Limitations    Retraction Limitations 5 seconds (shoulder blade pinches)      Shoulder Exercises: Standing   Other Standing Exercises Codmans forward and back; CW; CCW      Shoulder Exercises: ROM/Strengthening   Pendulum Forward/Back; CW; CCW    Other ROM/Strengthening Exercises Biceps curls palms in to palms up with slow eccentrics 20X 3 seconds 1#      Manual Therapy   Manual Therapy Passive ROM    Passive ROM IR, ER to comfort, flexion                    PT Education - 05/08/21 1237     Education Details Added supine shoulder flexion to [facility]'s clinic program and biceps curls to his HEP.    Person(s) Educated Patient    Methods Explanation;Verbal cues;Handout    Comprehension Verbal cues required;Returned demonstration;Need further instruction;Verbalized understanding              PT Short Term Goals - 05/08/21 1238       PT SHORT TERM GOAL #1   Title Improve R shoulder PROM for flexion to150; IR to 60 and ER to 90 degrees.    Baseline 150; 70; 85 at 05/08/2021 visit    Time 4    Period Weeks    Status Partially Met    Target Date 05/15/21               PT Long Term Goals - 04/24/21 1407       PT LONG TERM GOAL #1   Title Improve FOTO to 57.    Baseline 13    Time 10    Period Weeks    Status On-going      PT LONG TERM GOAL #2   Title Improve R shoulder AROM for flexion to 170; ER to 90; IR to 60 and horizontal adduction to 40 degrees (90% or better).    Baseline 140; 65; 85; deferred due to 2 weeks post-surgery    Time 10    Period Weeks    Status Partially Met      PT LONG TERM GOAL #3   Title Improve R shoulder strength to 60% of the uninvolved L at 12 weeks post-surgery and 90% at 9 months post-surgery.    Baseline Deferred    Time 12    Period Weeks    Status On-going      PT LONG TERM GOAL #4  Title Improve R shoulder pain to consistently 0-3/10 on the Numeric Pain  Rating Scale.    Baseline Low pain    Time 10    Period Weeks    Status Achieved      PT LONG TERM GOAL #5   Title Andrik will be independent and compliant with his long-term HEP at DC.    Time 10    Period Weeks    Status On-going                   Plan - 05/08/21 1240     Clinical Impression Statement Ziaire is making great progress post R RTC repair and carpal tunnel release.  We made slow progressions to scapular and biceps strength today as AROM (supine) is excellent.  Still waiting on RTC strengthening to allow more time for healing post-surgery.  Given his great AROM, Cashus's prognosis is excellent to meet all LTGs.    Personal Factors and Comorbidities Comorbidity 3+    Comorbidities R CT release, sleep apnea, prostate cancer, previous L knee surgery, obesity    Examination-Activity Limitations Dressing;Hygiene/Grooming;Lift;Reach Overhead;Carry    Examination-Participation Restrictions Cleaning;Interpersonal Relationship;Occupation;Community Activity;Driving    Stability/Clinical Decision Making Stable/Uncomplicated    Rehab Potential Good    PT Frequency Other (comment)   1-2X/week   PT Duration Other (comment)   10 weeks   PT Treatment/Interventions ADLs/Self Care Home Management;Cryotherapy;Therapeutic activities;Therapeutic exercise;Neuromuscular re-education;Patient/family education;Manual techniques;Passive range of motion;Vasopneumatic Device    PT Next Visit Plan RA.  Continue scapular and appropriate general strength work while avoiding resisted RTC strengthening for now.    PT Home Exercise Plan Access Code: [email or web address]  URL: [email or web address]  Date: 05/08/2021  Prepared by: Vista Mink    Exercises  Pendulums - 5 x daily - 7 x weekly - 1 sets - 30 reps  Standing Scapular Retraction - 5 x daily - 7 x weekly - 1 sets - 5 reps - 5 second hold  Supine Scapular Protraction in Flexion with Dumbbells - 2 x daily - 7 x weekly - 1 sets - 20 reps - 3  seconds hold  Supine Shoulder Internal Rotation Stretch - 2 x daily - 7 x weekly - 1 sets - 10 reps - 10 seconds hold  Supine Shoulder External Rotation Stretch - 2 x daily - 7 x weekly - 1 sets - 10 reps - 10 seconds hold  Standing Bicep Curls with Dumbbells and Rotation - 1 x daily - 7 x weekly - 1 sets - 20-30 reps    Consulted and Agree with Plan of Care Patient             Patient will benefit from skilled therapeutic intervention in order to improve the following deficits and impairments:  Decreased activity tolerance, Decreased endurance, Decreased range of motion, Decreased strength, Increased edema, Impaired flexibility, Impaired UE functional use, Obesity, Pain  Visit Diagnosis: Stiffness of right shoulder, not elsewhere classified  Chronic right shoulder pain  Muscle weakness (generalized)  Localized edema     Problem List Patient Active Problem List   Diagnosis Date Noted   Nontraumatic incomplete tear of right rotator cuff 03/12/2021   Carpal tunnel syndrome on right 01/07/2021   Prostate cancer ([place])     Farley Ly PT, MPT 05/08/2021, 12:44 PM  [facility] [address] [place], [place], [address] Phone: [phone]   Fax:  [phone]  Name: WILKIN LIPPY MRN: [id number] Date of Birth: [birth year]

## 2021-05-08 NOTE — Patient Instructions (Signed)
Access Code: [email or web address] URL: [email or web address] Date: 05/08/2021 Prepared by: Vista Mink  Exercises Pendulums - 5 x daily - 7 x weekly - 1 sets - 30 reps Standing Scapular Retraction - 5 x daily - 7 x weekly - 1 sets - 5 reps - 5 second hold Supine Scapular Protraction in Flexion with Dumbbells - 2 x daily - 7 x weekly - 1 sets - 20 reps - 3 seconds hold Supine Shoulder Internal Rotation Stretch - 2 x daily - 7 x weekly - 1 sets - 10 reps - 10 seconds hold Supine Shoulder External Rotation Stretch - 2 x daily - 7 x weekly - 1 sets - 10 reps - 10 seconds hold Standing Bicep Curls with Dumbbells and Rotation - 1 x daily - 7 x weekly - 1 sets - 20-30 reps

## 2021-05-15 ENCOUNTER — Ambulatory Visit (INDEPENDENT_AMBULATORY_CARE_PROVIDER_SITE_OTHER): Payer: 59 | Admitting: Physical Therapy

## 2021-05-15 ENCOUNTER — Other Ambulatory Visit: Payer: Self-pay

## 2021-05-15 ENCOUNTER — Encounter: Payer: Self-pay | Admitting: Physical Therapy

## 2021-05-15 DIAGNOSIS — R600 Localized edema: Secondary | ICD-10-CM

## 2021-05-15 DIAGNOSIS — M25511 Pain in right shoulder: Secondary | ICD-10-CM

## 2021-05-15 DIAGNOSIS — M25611 Stiffness of right shoulder, not elsewhere classified: Secondary | ICD-10-CM | POA: Diagnosis not present

## 2021-05-15 DIAGNOSIS — M6281 Muscle weakness (generalized): Secondary | ICD-10-CM

## 2021-05-15 DIAGNOSIS — G8929 Other chronic pain: Secondary | ICD-10-CM

## 2021-05-15 NOTE — Therapy (Signed)
[facility] Therapy [address] [place], [place], [address] Phone: [phone]   Fax:  [phone]  Physical Therapy Treatment  Patient Details  Name: Kenneth Wiggins MRN: [id number] Date of Birth: 09/24/1961 Referring Provider (PT): Aundra Dubin PA-C   Encounter Date: 05/15/2021   PT End of Session - 05/15/21 1240     Visit Number 6    Number of Visits 20    PT Start Time 1740   pt arrived late   PT Stop Time 1225    PT Time Calculation (min) 33 min    Activity Tolerance Patient tolerated treatment well;No increased pain    Behavior During Therapy WFL for tasks assessed/performed             Past Medical History:  Diagnosis Date   Asthma as child   hx of   Cancer ([place])    prostate - psa 3.93 on 07/06/2012   GERD (gastroesophageal reflux disease)    hx of   H/O hiatal hernia 15 years ago   Hypertension    Prostate cancer ([place]) 08/09/12   Gleason 3+3=6, vol 20-25 gm   Prostate cancer ([place]) 01-10-13   Seed implant to be done due to Robotic surgery aborted   Sleep apnea    CPAP    Past Surgical History:  Procedure Laterality Date   CARPAL TUNNEL RELEASE Right 04/03/2021   Procedure: RIGHT CARPAL TUNNEL RELEASE;  Surgeon: Leandrew Koyanagi, MD;  Location: [place];  Service: Orthopedics;  Laterality: Right;   HERNIA REPAIR  as child   KNEE SURGERY Left as child   tore a ligament    MASS EXCISION Right 03/07/2021   Procedure: EXCISION MASS OF RIGHT CHEST;  Surgeon: Johnathan Hausen, MD;  [facility];  Service: General;  Laterality: Right;   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   RADIOACTIVE SEED IMPLANT N/A 01/17/2013   Procedure: RADIOACTIVE SEED IMPLANT;  Surgeon: Bernestine Amass, MD;  [facility];  Service: Urology;  Laterality: N/A;  W/ MURRAY    ROBOT ASSISTED LAPAROSCOPIC RADICAL PROSTATECTOMY N/A 11/24/2012   Procedure: Attempted ROBOTIC ASSISTED LAPAROSCOPIC RADICAL PROSTATECTOMY, Abandoned;  Surgeon: Bernestine Amass, MD;   [facility];  Service: Urology;  Laterality: N/A;   scrotum explotion Left    SHOULDER ARTHROSCOPY WITH ROTATOR CUFF REPAIR AND SUBACROMIAL DECOMPRESSION Right 04/03/2021   Procedure: RIGHT SHOULDER ARTHROSCOPY ROTATOR CUFF REPAIR, SUBACROMIAL DECOMPRESSION, EXTENSIVE DEBRIDEMENT;  Surgeon: Leandrew Koyanagi, MD;  Location: [place];  Service: Orthopedics;  Laterality: Right;   testical growth removed     VASECTOMY      There were no vitals filed for this visit.   Subjective Assessment - 05/15/21 1152     Subjective back to Dr. Erlinda Hong tomorrow. 6 weeks post op today    Pertinent History Previous L knee surgery, sleep apnea, HTN, prostate cancer    Limitations House hold activities;Lifting    Patient Stated Goals Return to normal R shoulder function, work and fishing    Currently in Pain? No/denies                [facility] PT Assessment - 05/15/21 1156       Assessment   Medical Diagnosis R RTC repair    Referring Provider (PT) Aundra Dubin PA-C    Onset Date/Surgical Date 04/03/21    Hand Dominance Right    Next MD Visit 05/16/21      Observation/Other Assessments   Focus on  Therapeutic Outcomes (FOTO)  55      PROM   PROM Assessment Site Shoulder    Right/Left Shoulder Right    Right Shoulder Flexion 150 Degrees    Right Shoulder ABduction 166 Degrees    Right Shoulder Internal Rotation 85 Degrees    Right Shoulder External Rotation 73 Degrees                           [facility] Adult PT Treatment/Exercise - 05/15/21 1159       Shoulder Exercises: Supine   Protraction Right;Weights   3x10   Protraction Weight (lbs) 2    Flexion AAROM   3x10; 2# bar     Shoulder Exercises: Seated   Retraction Strengthening;Both   3x10   Retraction Limitations 5 seconds (shoulder blade pinches)      Shoulder Exercises: Standing   Other Standing Exercises IR/ER isometric walkout with L2 band 3x10      Shoulder Exercises: Isometric Strengthening   Flexion Limitations  20x5" hold    ABduction Limitations 20x5" hold      Manual Therapy   Manual Therapy Passive ROM    Passive ROM Rt shoulder all motions to tolerance in supine                      PT Short Term Goals - 05/08/21 1238       PT SHORT TERM GOAL #1   Title Improve R shoulder PROM for flexion to150; IR to 60 and ER to 90 degrees.    Baseline 150; 70; 85 at 05/08/2021 visit    Time 4    Period Weeks    Status Partially Met    Target Date 05/15/21               PT Long Term Goals - 04/24/21 1407       PT LONG TERM GOAL #1   Title Improve FOTO to 57.    Baseline 13    Time 10    Period Weeks    Status On-going      PT LONG TERM GOAL #2   Title Improve R shoulder AROM for flexion to 170; ER to 90; IR to 60 and horizontal adduction to 40 degrees (90% or better).    Baseline 140; 65; 85; deferred due to 2 weeks post-surgery    Time 10    Period Weeks    Status Partially Met      PT LONG TERM GOAL #3   Title Improve R shoulder strength to 60% of the uninvolved L at 12 weeks post-surgery and 90% at 9 months post-surgery.    Baseline Deferred    Time 12    Period Weeks    Status On-going      PT LONG TERM GOAL #4   Title Improve R shoulder pain to consistently 0-3/10 on the Numeric Pain Rating Scale.    Baseline Low pain    Time 10    Period Weeks    Status Achieved      PT LONG TERM GOAL #5   Title Jahquez will be independent and compliant with his long-term HEP at DC.    Time 10    Period Weeks    Status On-going                   Plan - 05/15/21 1241     Clinical Impression Statement Pt demonstrating great PROM  at this time, and able to tolerate AAROM and isometrics with expected fatige today.  Anticipate will be able to progress strengthening next visit, but will see what MD says after visit.  Will continue to benefit from PT to maximize function.    Personal Factors and Comorbidities Comorbidity 3+    Comorbidities R CT release, sleep  apnea, prostate cancer, previous L knee surgery, obesity    Examination-Activity Limitations Dressing;Hygiene/Grooming;Lift;Reach Overhead;Carry    Examination-Participation Restrictions Cleaning;Interpersonal Relationship;Occupation;Community Activity;Driving    Stability/Clinical Decision Making Stable/Uncomplicated    Rehab Potential Good    PT Frequency Other (comment)   1-2X/week   PT Duration Other (comment)   10 weeks   PT Treatment/Interventions ADLs/Self Care Home Management;Cryotherapy;Therapeutic activities;Therapeutic exercise;Neuromuscular re-education;Patient/family education;Manual techniques;Passive range of motion;Vasopneumatic Device    PT Next Visit Plan see what MD says, likely able to progress to strengthening phase    PT Home Exercise Plan Access Code: [email or web address]    Consulted and Agree with Plan of Care Patient             Patient will benefit from skilled therapeutic intervention in order to improve the following deficits and impairments:  Decreased activity tolerance, Decreased endurance, Decreased range of motion, Decreased strength, Increased edema, Impaired flexibility, Impaired UE functional use, Obesity, Pain  Visit Diagnosis: Stiffness of right shoulder, not elsewhere classified  Chronic right shoulder pain  Muscle weakness (generalized)  Localized edema     Problem List Patient Active Problem List   Diagnosis Date Noted   Nontraumatic incomplete tear of right rotator cuff 03/12/2021   Carpal tunnel syndrome on right 01/07/2021   Prostate cancer [facility])       Laureen Abrahams, PT, DPT 05/15/21 12:45 PM     [facility] [address] [place], [place], [address] Phone: [phone]   Fax:  [phone]  Name: LYNNWOOD BECKFORD MRN: [id number] Date of Birth: 06-Aug-1961

## 2021-05-16 ENCOUNTER — Ambulatory Visit (INDEPENDENT_AMBULATORY_CARE_PROVIDER_SITE_OTHER): Payer: 59 | Admitting: Physician Assistant

## 2021-05-16 ENCOUNTER — Encounter: Payer: Self-pay | Admitting: Orthopaedic Surgery

## 2021-05-16 DIAGNOSIS — M75111 Incomplete rotator cuff tear or rupture of right shoulder, not specified as traumatic: Secondary | ICD-10-CM

## 2021-05-16 DIAGNOSIS — G5601 Carpal tunnel syndrome, right upper limb: Secondary | ICD-10-CM

## 2021-05-16 NOTE — Progress Notes (Signed)
Post-Op Visit Note   Patient: Kenneth Wiggins           Date of Birth: January 20, 1961           MRN: [id number] Visit Date: 05/16/2021 PCP: Deland Pretty, MD   Assessment & Plan:  Chief Complaint:  Chief Complaint  Patient presents with   Right Shoulder - Pain   Right Hand - Pain   Visit Diagnoses:  1. Nontraumatic incomplete tear of right rotator cuff   2. Carpal tunnel syndrome on right     Plan: Patient is a pleasant 60 year old gentleman who comes in today 6 weeks out right shoulder arthroscopic debridement rotator cuff repair and right carpal tunnel release for severe compression of the median nerve.  Date of surgery 04/03/2021.  He has been doing great.  No complaints with the hand other than continued numbness to the right thumb.  In regards to the right shoulder, he is in physical therapy making great progress.  He has minimal pain.  He is not taking any narcotic pain medication.  Examination of the right hand reveals a fully healed surgical scar without complication.  Decree sensation to the thumb.  Fingers warm well perfused.  Right shoulder exam reveals approximately 75% range of motion all planes.  He is neurovascular intact distally.  At this point, he may officially discontinue the sling.  Continue with physical therapy where they will start working more on strengthening exercises.  Follow-up with [place] in 6 weeks time for recheck.  Call with concerns or questions in the meantime.  Follow-Up Instructions: Return in about 6 weeks (around 06/27/2021).   Orders:  No orders of the defined types were placed in this encounter.  No orders of the defined types were placed in this encounter.   Imaging: No new imaging  PMFS History: Patient Active Problem List   Diagnosis Date Noted   Nontraumatic incomplete tear of right rotator cuff 03/12/2021   Carpal tunnel syndrome on right 01/07/2021   Prostate cancer [facility])    Past Medical History:  Diagnosis Date   Asthma as child   hx  of   Cancer ([place])    prostate - psa 3.93 on 07/06/2012   GERD (gastroesophageal reflux disease)    hx of   H/O hiatal hernia 15 years ago   Hypertension    Prostate cancer ([place]) 08/09/12   Gleason 3+3=6, vol 20-25 gm   Prostate cancer ([place]) 01-10-13   Seed implant to be done due to Robotic surgery aborted   Sleep apnea    CPAP    Family History  Problem Relation Age of Onset   Cancer Maternal Aunt        colon   Cancer Paternal Uncle        prostate, 2 uncles   Cancer Daughter        angiosarcoma age 3   Prostate cancer Other     Past Surgical History:  Procedure Laterality Date   CARPAL TUNNEL RELEASE Right 04/03/2021   Procedure: RIGHT CARPAL TUNNEL RELEASE;  Surgeon: Leandrew Koyanagi, MD;  Location: [place];  Service: Orthopedics;  Laterality: Right;   HERNIA REPAIR  as child   KNEE SURGERY Left as child   tore a ligament    MASS EXCISION Right 03/07/2021   Procedure: EXCISION MASS OF RIGHT CHEST;  Surgeon: Johnathan Hausen, MD;  [facility];  Service: General;  Laterality: Right;   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   RADIOACTIVE  SEED IMPLANT N/A 01/17/2013   Procedure: RADIOACTIVE SEED IMPLANT;  Surgeon: Bernestine Amass, MD;  [facility];  Service: Urology;  Laterality: N/A;  W/ MURRAY    ROBOT ASSISTED LAPAROSCOPIC RADICAL PROSTATECTOMY N/A 11/24/2012   Procedure: Attempted ROBOTIC ASSISTED LAPAROSCOPIC RADICAL PROSTATECTOMY, Abandoned;  Surgeon: Bernestine Amass, MD;  [facility];  Service: Urology;  Laterality: N/A;   scrotum explotion Left    SHOULDER ARTHROSCOPY WITH ROTATOR CUFF REPAIR AND SUBACROMIAL DECOMPRESSION Right 04/03/2021   Procedure: RIGHT SHOULDER ARTHROSCOPY ROTATOR CUFF REPAIR, SUBACROMIAL DECOMPRESSION, EXTENSIVE DEBRIDEMENT;  Surgeon: Leandrew Koyanagi, MD;  Location: [place];  Service: Orthopedics;  Laterality: Right;   testical growth removed     VASECTOMY     Social History   Occupational History   Occupation: TRUCK DRIVER     Employer:  [employer]  Tobacco Use   Smoking status: Never   Smokeless tobacco: Never  Vaping Use   Vaping Use: Never used  Substance and Sexual Activity   Alcohol use: No   Drug use: No   Sexual activity: Yes

## 2021-05-22 ENCOUNTER — Encounter: Payer: 59 | Admitting: Physical Therapy

## 2021-05-29 ENCOUNTER — Encounter: Payer: Self-pay | Admitting: Physical Therapy

## 2021-05-29 ENCOUNTER — Ambulatory Visit (INDEPENDENT_AMBULATORY_CARE_PROVIDER_SITE_OTHER): Payer: 59 | Admitting: Physical Therapy

## 2021-05-29 ENCOUNTER — Other Ambulatory Visit: Payer: Self-pay

## 2021-05-29 DIAGNOSIS — M25611 Stiffness of right shoulder, not elsewhere classified: Secondary | ICD-10-CM

## 2021-05-29 DIAGNOSIS — G8929 Other chronic pain: Secondary | ICD-10-CM

## 2021-05-29 DIAGNOSIS — M25612 Stiffness of left shoulder, not elsewhere classified: Secondary | ICD-10-CM

## 2021-05-29 DIAGNOSIS — R600 Localized edema: Secondary | ICD-10-CM | POA: Diagnosis not present

## 2021-05-29 DIAGNOSIS — M25511 Pain in right shoulder: Secondary | ICD-10-CM | POA: Diagnosis not present

## 2021-05-29 DIAGNOSIS — M6281 Muscle weakness (generalized): Secondary | ICD-10-CM

## 2021-05-29 NOTE — Therapy (Addendum)
[facility] [address] [place], [place], [address] Phone: [phone]   Fax:  [phone]  Physical Therapy Treatment/Discharge   Patient Details  Name: Kenneth Wiggins MRN: [id number] Date of Birth: 1961/08/25 Referring Provider (PT): Aundra Dubin PA-C   Encounter Date: 05/29/2021   PT End of Session - 05/29/21 1245     Visit Number 7    Number of Visits 20    Date for PT Re-Evaluation 06/26/21    PT Start Time 6789    PT Stop Time 1225    PT Time Calculation (min) 41 min    Activity Tolerance Patient tolerated treatment well;No increased pain    Behavior During Therapy WFL for tasks assessed/performed             Past Medical History:  Diagnosis Date   Asthma as child   hx of   Cancer ([place])    prostate - psa 3.93 on 07/06/2012   GERD (gastroesophageal reflux disease)    hx of   H/O hiatal hernia 15 years ago   Hypertension    Prostate cancer ([place]) 08/09/12   Gleason 3+3=6, vol 20-25 gm   Prostate cancer ([place]) 01-10-13   Seed implant to be done due to Robotic surgery aborted   Sleep apnea    CPAP    Past Surgical History:  Procedure Laterality Date   CARPAL TUNNEL RELEASE Right 04/03/2021   Procedure: RIGHT CARPAL TUNNEL RELEASE;  Surgeon: Leandrew Koyanagi, MD;  Location: [place];  Service: Orthopedics;  Laterality: Right;   HERNIA REPAIR  as child   KNEE SURGERY Left as child   tore a ligament    MASS EXCISION Right 03/07/2021   Procedure: EXCISION MASS OF RIGHT CHEST;  Surgeon: Johnathan Hausen, MD;  [facility]: [facility];  Service: General;  Laterality: Right;   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   RADIOACTIVE SEED IMPLANT N/A 01/17/2013   Procedure: RADIOACTIVE SEED IMPLANT;  Surgeon: Bernestine Amass, MD;  [facility];  Service: Urology;  Laterality: N/A;  W/ MURRAY    ROBOT ASSISTED LAPAROSCOPIC RADICAL PROSTATECTOMY N/A 11/24/2012   Procedure: Attempted ROBOTIC ASSISTED LAPAROSCOPIC RADICAL PROSTATECTOMY,  Abandoned;  Surgeon: Bernestine Amass, MD;  [facility];  Service: Urology;  Laterality: N/A;   scrotum explotion Left    SHOULDER ARTHROSCOPY WITH ROTATOR CUFF REPAIR AND SUBACROMIAL DECOMPRESSION Right 04/03/2021   Procedure: RIGHT SHOULDER ARTHROSCOPY ROTATOR CUFF REPAIR, SUBACROMIAL DECOMPRESSION, EXTENSIVE DEBRIDEMENT;  Surgeon: Leandrew Koyanagi, MD;  Location: [facility];  Service: Orthopedics;  Laterality: Right;   testical growth removed     VASECTOMY      There were no vitals filed for this visit.   Subjective Assessment - 05/29/21 1149     Subjective able to start strengthening now    Pertinent History Previous L knee surgery, sleep apnea, HTN, prostate cancer    Limitations House hold activities;Lifting    Patient Stated Goals Return to normal R shoulder function, work and fishing    Currently in Pain? No/denies                [facility] PT Assessment - 05/29/21 1154       Assessment   Medical Diagnosis R RTC repair    Referring Provider (PT) Aundra Dubin PA-C    Onset Date/Surgical Date 04/03/21    Hand Dominance Right      AROM   Overall AROM Comments measured sitting    Right Shoulder  Flexion 146 Degrees    Right Shoulder ABduction 148 Degrees    Right Shoulder Internal Rotation --   FIR to Rt iliac crest   Right Shoulder External Rotation --   FER WNL                          [facility] Adult PT Treatment/Exercise - 05/29/21 1149       Shoulder Exercises: Supine   Other Supine Exercises bicep curl 3# on Rt 3x10      Shoulder Exercises: Seated   Other Seated Exercises isometric hold 90 deg flex and then 90 deg abdct with 2# ball      Shoulder Exercises: Sidelying   External Rotation Right;Weights   3x10   External Rotation Weight (lbs) 3      Shoulder Exercises: Standing   External Rotation Right;Theraband   3x10   Theraband Level (Shoulder External Rotation) Level 4 (Blue)    Internal Rotation Right;Theraband   3x10   Theraband Level  (Shoulder Internal Rotation) Level 4 (Blue)    Flexion Right;Weights   3x10   Shoulder Flexion Weight (lbs) 3    ABduction Right;Weights   3x10   Shoulder ABduction Weight (lbs) 3    Row Both;Theraband   3x10   Theraband Level (Shoulder Row) Level 4 (Blue)      Shoulder Exercises: ROM/Strengthening   UBE (Upper Arm Bike) L3 x 8 min (4 min each direction)    Proximal Shoulder Strengthening, Supine Rt 5# circles CW/CCW x 20 reps, then A-Z                      PT Short Term Goals - 05/08/21 1238       PT SHORT TERM GOAL #1   Title Improve R shoulder PROM for flexion to150; IR to 60 and ER to 90 degrees.    Baseline 150; 70; 85 at 05/08/2021 visit    Time 4    Period Weeks    Status Partially Met    Target Date 05/15/21               PT Long Term Goals - 04/24/21 1407       PT LONG TERM GOAL #1   Title Improve FOTO to 57.    Baseline 13    Time 10    Period Weeks    Status On-going      PT LONG TERM GOAL #2   Title Improve R shoulder AROM for flexion to 170; ER to 90; IR to 60 and horizontal adduction to 40 degrees (90% or better).    Baseline 140; 65; 85; deferred due to 2 weeks post-surgery    Time 10    Period Weeks    Status Partially Met      PT LONG TERM GOAL #3   Title Improve R shoulder strength to 60% of the uninvolved L at 12 weeks post-surgery and 90% at 9 months post-surgery.    Baseline Deferred    Time 12    Period Weeks    Status On-going      PT LONG TERM GOAL #4   Title Improve R shoulder pain to consistently 0-3/10 on the Numeric Pain Rating Scale.    Baseline Low pain    Time 10    Period Weeks    Status Achieved      PT LONG TERM GOAL #5   Title Kenneth Wiggins will be independent and  compliant with his long-term HEP at DC.    Time 10    Period Weeks    Status On-going                   Plan - 05/29/21 1246     Clinical Impression Statement Pt demonstrating near normal values for AROM as we are now able to progress to  more strengthening exercises today.  Expected fatigue noted today with increased activity.  Will continue to benefit from PT to maximize function.    Personal Factors and Comorbidities Comorbidity 3+    Comorbidities R CT release, sleep apnea, prostate cancer, previous L knee surgery, obesity    Examination-Activity Limitations Dressing;Hygiene/Grooming;Lift;Reach Overhead;Carry    Examination-Participation Restrictions Cleaning;Interpersonal Relationship;Occupation;Community Activity;Driving    Stability/Clinical Decision Making Stable/Uncomplicated    Rehab Potential Good    PT Frequency Other (comment)   1-2X/week   PT Duration Other (comment)   10 weeks   PT Treatment/Interventions ADLs/Self Care Home Management;Cryotherapy;Therapeutic activities;Therapeutic exercise;Neuromuscular re-education;Patient/family education;Manual techniques;Passive range of motion;Vasopneumatic Device    PT Next Visit Plan continue light strengthening as able    PT Home Exercise Plan Access Code: [email or web address]    Consulted and Agree with Plan of Care Patient             Patient will benefit from skilled therapeutic intervention in order to improve the following deficits and impairments:  Decreased activity tolerance, Decreased endurance, Decreased range of motion, Decreased strength, Increased edema, Impaired flexibility, Impaired UE functional use, Obesity, Pain  Visit Diagnosis: Stiffness of right shoulder, not elsewhere classified  Chronic right shoulder pain  Muscle weakness (generalized)  Localized edema  Stiffness of left shoulder, not elsewhere classified     Problem List Patient Active Problem List   Diagnosis Date Noted   Nontraumatic incomplete tear of right rotator cuff 03/12/2021   Carpal tunnel syndrome on right 01/07/2021   Prostate cancer ([place])       Laureen Abrahams, PT, DPT 05/29/21 12:51 PM  PHYSICAL THERAPY DISCHARGE SUMMARY  Visits from Start of Care: 7  Current  functional level related to goals / functional outcomes: See note   Remaining deficits: See note   Education / Equipment: HEP   Patient agrees to discharge. Patient goals were partially met. Patient is being discharged due to not returning since the last visit.  Scot Jun, PT, DPT, OCS, ATC 07/24/21  3:17 PM        [facility] [address] [place], [place], [address] Phone: [phone]   Fax:  [phone]  Name: Kenneth Wiggins MRN: [id number] Date of Birth: 10-Dec-1960

## 2021-06-05 ENCOUNTER — Encounter: Payer: 59 | Admitting: Physical Therapy

## 2021-06-12 ENCOUNTER — Encounter: Payer: 59 | Admitting: Physical Therapy

## 2021-06-19 ENCOUNTER — Encounter: Payer: 59 | Admitting: Rehabilitative and Restorative Service Providers"

## 2021-06-26 ENCOUNTER — Encounter: Payer: 59 | Admitting: Physical Therapy

## 2021-08-16 ENCOUNTER — Other Ambulatory Visit: Payer: Self-pay | Admitting: Gastroenterology

## 2021-08-16 DIAGNOSIS — R1084 Generalized abdominal pain: Secondary | ICD-10-CM

## 2021-09-03 ENCOUNTER — Ambulatory Visit
Admission: RE | Admit: 2021-09-03 | Discharge: 2021-09-03 | Disposition: A | Discharge status: Home / Self Care | Payer: 59 | Source: Ambulatory Visit | Attending: Gastroenterology | Admitting: Gastroenterology

## 2021-09-03 ENCOUNTER — Other Ambulatory Visit: Payer: Self-pay

## 2021-09-03 ENCOUNTER — Other Ambulatory Visit: Payer: Self-pay | Admitting: Gastroenterology

## 2021-09-03 DIAGNOSIS — R9389 Abnormal findings on diagnostic imaging of other specified body structures: Secondary | ICD-10-CM

## 2021-09-03 DIAGNOSIS — R1084 Generalized abdominal pain: Secondary | ICD-10-CM

## 2021-09-03 MED ORDER — IOPAMIDOL (ISOVUE-370) INJECTION 76%
100.0000 mL | Freq: Once | INTRAVENOUS | Status: AC | PRN
  Administered 2021-09-03: 100 mL via INTRAVENOUS

## 2021-09-04 ENCOUNTER — Ambulatory Visit
Admission: RE | Admit: 2021-09-04 | Discharge: 2021-09-04 | Disposition: A | Discharge status: Home / Self Care | Payer: 59 | Source: Ambulatory Visit | Attending: Gastroenterology | Admitting: Gastroenterology

## 2021-09-04 DIAGNOSIS — R9389 Abnormal findings on diagnostic imaging of other specified body structures: Secondary | ICD-10-CM

## 2021-09-04 MED ORDER — IOPAMIDOL (ISOVUE-300) INJECTION 61%
75.0000 mL | Freq: Once | INTRAVENOUS | Status: AC | PRN
  Administered 2021-09-04: 75 mL via INTRAVENOUS

## 2021-09-06 ENCOUNTER — Other Ambulatory Visit: Payer: Self-pay

## 2021-09-06 ENCOUNTER — Inpatient Hospital Stay (HOSPITAL_BASED_OUTPATIENT_CLINIC_OR_DEPARTMENT_OTHER)
Admission: EM | Admit: 2021-09-06 | Discharge: 2021-09-10 | DRG: 940 | Disposition: A | Discharge status: Home / Self Care | Payer: 59 | Attending: Internal Medicine | Admitting: Internal Medicine

## 2021-09-06 ENCOUNTER — Emergency Department (HOSPITAL_BASED_OUTPATIENT_CLINIC_OR_DEPARTMENT_OTHER): Payer: 59 | Admitting: Radiology

## 2021-09-06 ENCOUNTER — Encounter (HOSPITAL_BASED_OUTPATIENT_CLINIC_OR_DEPARTMENT_OTHER): Payer: Self-pay | Admitting: Emergency Medicine

## 2021-09-06 DIAGNOSIS — R591 Generalized enlarged lymph nodes: Secondary | ICD-10-CM

## 2021-09-06 DIAGNOSIS — R590 Localized enlarged lymph nodes: Secondary | ICD-10-CM | POA: Diagnosis present

## 2021-09-06 DIAGNOSIS — Z6841 Body Mass Index (BMI) 40.0 and over, adult: Secondary | ICD-10-CM | POA: Diagnosis not present

## 2021-09-06 DIAGNOSIS — N182 Chronic kidney disease, stage 2 (mild): Secondary | ICD-10-CM | POA: Diagnosis present

## 2021-09-06 DIAGNOSIS — R0602 Shortness of breath: Secondary | ICD-10-CM | POA: Diagnosis present

## 2021-09-06 DIAGNOSIS — I129 Hypertensive chronic kidney disease with stage 1 through stage 4 chronic kidney disease, or unspecified chronic kidney disease: Secondary | ICD-10-CM | POA: Diagnosis present

## 2021-09-06 DIAGNOSIS — R188 Other ascites: Principal | ICD-10-CM | POA: Diagnosis present

## 2021-09-06 DIAGNOSIS — Z20822 Contact with and (suspected) exposure to covid-19: Secondary | ICD-10-CM | POA: Diagnosis present

## 2021-09-06 DIAGNOSIS — Z79899 Other long term (current) drug therapy: Secondary | ICD-10-CM | POA: Diagnosis not present

## 2021-09-06 DIAGNOSIS — Z8546 Personal history of malignant neoplasm of prostate: Secondary | ICD-10-CM | POA: Diagnosis not present

## 2021-09-06 DIAGNOSIS — R609 Edema, unspecified: Secondary | ICD-10-CM | POA: Diagnosis not present

## 2021-09-06 DIAGNOSIS — Z9079 Acquired absence of other genital organ(s): Secondary | ICD-10-CM

## 2021-09-06 DIAGNOSIS — Z8042 Family history of malignant neoplasm of prostate: Secondary | ICD-10-CM | POA: Diagnosis not present

## 2021-09-06 DIAGNOSIS — R599 Enlarged lymph nodes, unspecified: Secondary | ICD-10-CM

## 2021-09-06 DIAGNOSIS — R000 Tachycardia, unspecified: Secondary | ICD-10-CM | POA: Diagnosis present

## 2021-09-06 DIAGNOSIS — M7989 Other specified soft tissue disorders: Secondary | ICD-10-CM | POA: Diagnosis not present

## 2021-09-06 DIAGNOSIS — J45909 Unspecified asthma, uncomplicated: Secondary | ICD-10-CM | POA: Diagnosis present

## 2021-09-06 DIAGNOSIS — G4733 Obstructive sleep apnea (adult) (pediatric): Secondary | ICD-10-CM | POA: Diagnosis present

## 2021-09-06 DIAGNOSIS — R197 Diarrhea, unspecified: Secondary | ICD-10-CM | POA: Diagnosis present

## 2021-09-06 DIAGNOSIS — E872 Acidosis, unspecified: Secondary | ICD-10-CM | POA: Diagnosis present

## 2021-09-06 DIAGNOSIS — N179 Acute kidney failure, unspecified: Secondary | ICD-10-CM | POA: Diagnosis present

## 2021-09-06 DIAGNOSIS — J90 Pleural effusion, not elsewhere classified: Secondary | ICD-10-CM | POA: Diagnosis present

## 2021-09-06 DIAGNOSIS — Z923 Personal history of irradiation: Secondary | ICD-10-CM | POA: Diagnosis not present

## 2021-09-06 DIAGNOSIS — C61 Malignant neoplasm of prostate: Secondary | ICD-10-CM | POA: Diagnosis present

## 2021-09-06 DIAGNOSIS — K219 Gastro-esophageal reflux disease without esophagitis: Secondary | ICD-10-CM | POA: Diagnosis present

## 2021-09-06 HISTORY — DX: Other ascites: R18.8

## 2021-09-06 LAB — URINALYSIS, ROUTINE W REFLEX MICROSCOPIC
Bilirubin Urine: NEGATIVE
Glucose, UA: NEGATIVE mg/dL
Hgb urine dipstick: NEGATIVE
Ketones, ur: NEGATIVE mg/dL
Leukocytes,Ua: NEGATIVE
Nitrite: NEGATIVE
Protein, ur: NEGATIVE mg/dL
Specific Gravity, Urine: 1.015 (ref 1.005–1.030)
pH: 5 (ref 5.0–8.0)

## 2021-09-06 LAB — COMPREHENSIVE METABOLIC PANEL
ALT: 20 U/L (ref 0–44)
AST: 23 U/L (ref 15–41)
Albumin: 4.2 g/dL (ref 3.5–5.0)
Alkaline Phosphatase: 66 U/L (ref 38–126)
Anion gap: 11 (ref 5–15)
BUN: 34 mg/dL — ABNORMAL HIGH (ref 6–20)
CO2: 27 mmol/L (ref 22–32)
Calcium: 10.7 mg/dL — ABNORMAL HIGH (ref 8.9–10.3)
Chloride: 102 mmol/L (ref 98–111)
Creatinine, Ser: 2.61 mg/dL — ABNORMAL HIGH (ref 0.61–1.24)
GFR, Estimated: 27 mL/min — ABNORMAL LOW (ref >60)
Glucose, Bld: 97 mg/dL (ref 70–99)
Potassium: 4.4 mmol/L (ref 3.5–5.1)
Sodium: 140 mmol/L (ref 135–145)
Total Bilirubin: 0.8 mg/dL (ref 0.3–1.2)
Total Protein: 7 g/dL (ref 6.5–8.1)

## 2021-09-06 LAB — RESP PANEL BY RT-PCR (FLU A&B, COVID) ARPGX2
Influenza A by PCR: NEGATIVE
Influenza B by PCR: NEGATIVE
SARS Coronavirus 2 by RT PCR: NEGATIVE

## 2021-09-06 LAB — LACTIC ACID, PLASMA
Lactic Acid, Venous: 2.1 mmol/L (ref 0.5–1.9)
Lactic Acid, Venous: 1.8 mmol/L (ref 0.5–1.9)

## 2021-09-06 LAB — CBC
HCT: 39.7 % (ref 39.0–52.0)
Hemoglobin: 13.3 g/dL (ref 13.0–17.0)
MCH: 27 pg (ref 26.0–34.0)
MCHC: 33.5 g/dL (ref 30.0–36.0)
MCV: 80.7 fL (ref 80.0–100.0)
Platelets: 415 10*3/uL — ABNORMAL HIGH (ref 150–400)
RBC: 4.92 MIL/uL (ref 4.22–5.81)
RDW: 13.4 % (ref 11.5–15.5)
WBC: 8.1 10*3/uL (ref 4.0–10.5)
nRBC: 0 % (ref 0.0–0.2)

## 2021-09-06 LAB — LIPASE, BLOOD: Lipase: 41 U/L (ref 11–51)

## 2021-09-06 MED ORDER — ACETAMINOPHEN 650 MG RE SUPP: 650.0000 mg | Freq: Four times a day (QID) | RECTAL | Status: DC | PRN

## 2021-09-06 MED ORDER — FUROSEMIDE 10 MG/ML IJ SOLN
20.0000 mg | Freq: Once | INTRAMUSCULAR | Status: AC
  Administered 2021-09-06: 20 mg via INTRAVENOUS
  Filled 2021-09-06: qty 2

## 2021-09-06 MED ORDER — ONDANSETRON HCL 4 MG/2ML IJ SOLN: 4.0000 mg | Freq: Four times a day (QID) | INTRAMUSCULAR | Status: DC | PRN

## 2021-09-06 MED ORDER — HEPARIN SODIUM (PORCINE) 5000 UNIT/ML IJ SOLN
5000.0000 [IU] | Freq: Three times a day (TID) | INTRAMUSCULAR | Status: DC
  Administered 2021-09-06 – 2021-09-09 (×9): 5000 [IU] via SUBCUTANEOUS
  Filled 2021-09-06 (×9): qty 1

## 2021-09-06 MED ORDER — ACETAMINOPHEN 325 MG PO TABS: 650.0000 mg | ORAL_TABLET | Freq: Four times a day (QID) | ORAL | Status: DC | PRN

## 2021-09-06 MED ORDER — ONDANSETRON HCL 4 MG PO TABS: 4.0000 mg | ORAL_TABLET | Freq: Four times a day (QID) | ORAL | Status: DC | PRN

## 2021-09-06 MED ORDER — OXYCODONE HCL 5 MG PO TABS: 5.0000 mg | ORAL_TABLET | ORAL | Status: DC | PRN

## 2021-09-06 NOTE — ED Triage Notes (Signed)
Pt c/o SOB for a month. Also reports "swelling to my stomach". Recently dx with ascites.

## 2021-09-06 NOTE — ED Notes (Signed)
[facility] at bedside 

## 2021-09-06 NOTE — ED Provider Notes (Signed)
[place] EMERGENCY DEPT Provider Note   CSN: [id number] Arrival date & time: 09/06/21  1159     History Chief Complaint  Patient presents with   Shortness of Breath    Kenneth Wiggins is a 60 y.o. male.  HPI  60 year old male with past medical history of obesity, HTN, sleep apnea, previous prostate cancer presents the emergency department with worsening abdominal distention and swelling along with shortness of breath.  Patient states he has been having abdominal swelling that is progressively getting worse.  He is being evaluated as an outpatient, had a CT of the chest abdomen pelvis which identified large amount of ascites, concern for cancer with pleural effusions worse on the right than left.  Patient states over the last day that his breathing is gotten worse, he cannot take a full deep breath.  He is also having ongoing diarrhea daily.  Denies any fever, chest pain, vomiting, lower extremity swelling.  Past Medical History:  Diagnosis Date   Ascites    Asthma as child   hx of   Cancer [facility])    prostate - psa 3.93 on 07/06/2012   GERD (gastroesophageal reflux disease)    hx of   H/O hiatal hernia 15 years ago   Hypertension    Prostate cancer ([place]) 08/09/2012   Gleason 3+3=6, vol 20-25 gm   Prostate cancer ([place]) 01/10/2013   Seed implant to be done due to Robotic surgery aborted   Sleep apnea    CPAP    Patient Active Problem List   Diagnosis Date Noted   Nontraumatic incomplete tear of right rotator cuff 03/12/2021   Carpal tunnel syndrome on right 01/07/2021   Prostate cancer [facility])     Past Surgical History:  Procedure Laterality Date   CARPAL TUNNEL RELEASE Right 04/03/2021   Procedure: RIGHT CARPAL TUNNEL RELEASE;  Surgeon: Leandrew Koyanagi, MD;  Location: [facility];  Service: Orthopedics;  Laterality: Right;   HERNIA REPAIR  as child   KNEE SURGERY Left as child   tore a ligament    MASS EXCISION Right 03/07/2021   Procedure: EXCISION MASS OF  RIGHT CHEST;  Surgeon: Johnathan Hausen, MD;  [facility];  Service: General;  Laterality: Right;   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   RADIOACTIVE SEED IMPLANT N/A 01/17/2013   Procedure: RADIOACTIVE SEED IMPLANT;  Surgeon: Bernestine Amass, MD;  [facility];  Service: Urology;  Laterality: N/A;  W/ Kenneth    ROBOT ASSISTED LAPAROSCOPIC RADICAL PROSTATECTOMY N/A 11/24/2012   Procedure: Attempted ROBOTIC ASSISTED LAPAROSCOPIC RADICAL PROSTATECTOMY, Abandoned;  Surgeon: Bernestine Amass, MD;  [facility];  Service: Urology;  Laterality: N/A;   scrotum explotion Left    SHOULDER ARTHROSCOPY WITH ROTATOR CUFF REPAIR AND SUBACROMIAL DECOMPRESSION Right 04/03/2021   Procedure: RIGHT SHOULDER ARTHROSCOPY ROTATOR CUFF REPAIR, SUBACROMIAL DECOMPRESSION, EXTENSIVE DEBRIDEMENT;  Surgeon: Leandrew Koyanagi, MD;  Location: [facility];  Service: Orthopedics;  Laterality: Right;   testical growth removed     VASECTOMY         Family History  Problem Relation Age of Onset   Cancer Maternal Aunt        colon   Cancer Paternal Uncle        prostate, 2 uncles   Cancer Daughter        angiosarcoma age 58   Prostate cancer Other     Social History   Tobacco Use   Smoking status: Never   Smokeless tobacco:  Never  Vaping Use   Vaping Use: Never used  Substance Use Topics   Alcohol use: No   Drug use: No    Home Medications Prior to Admission medications   Medication Sig Start Date End Date Taking? Authorizing Provider  amLODipine (NORVASC) 10 MG tablet Take 10 mg by mouth daily.    [provider]  chlorthalidone (HYGROTON) 25 MG tablet Take 25 mg by mouth daily.    [provider]  loratadine (CLARITIN) 10 MG tablet Take 10 mg by mouth daily.    [provider]  Multiple Vitamins-Minerals (MULTIVITAMIN WITH MINERALS) tablet Take 1 tablet by mouth daily.    [provider]  olmesartan (BENICAR) 40 MG tablet Take 40 mg by mouth daily.    [provider]  oxyCODONE-acetaminophen (PERCOCET) 5-325 MG tablet Take 1-2 tablets by mouth every 8 (eight) hours as needed for severe pain. 04/03/21   Leandrew Koyanagi, MD    Allergies    Patient has no known allergies.  Review of Systems   Review of Systems  Constitutional:  Positive for appetite change and fatigue. Negative for chills and fever.  HENT:  Negative for congestion.   Eyes:  Negative for visual disturbance.  Respiratory:  Positive for shortness of breath. Negative for cough.   Cardiovascular:  Negative for chest pain, palpitations and leg swelling.  Gastrointestinal:  Positive for abdominal distention, abdominal pain and diarrhea. Negative for blood in stool, nausea and vomiting.  Genitourinary:  Negative for dysuria.  Skin:  Negative for rash.  Neurological:  Negative for headaches.   Physical Exam Updated Vital Signs BP 118/71   Pulse (!) 108   Temp 98.5 F (36.9 C)   Resp 17   Ht 5\' 11"  (1.803 m)   Wt (!) 146.7 kg   SpO2 99%   BMI 45.11 kg/m   Physical Exam Vitals and nursing note reviewed.  Constitutional:      Appearance: Normal appearance. He is obese. He is not diaphoretic.  HENT:     Head: Normocephalic.     Mouth/Throat:     Mouth: Mucous membranes are moist.  Cardiovascular:     Rate and Rhythm: Tachycardia present.  Pulmonary:     Effort: Pulmonary effort is normal. No tachypnea or respiratory distress.     Breath sounds: Examination of the right-middle field reveals decreased breath sounds. Examination of the right-lower field reveals decreased breath sounds. Examination of the left-lower field reveals decreased breath sounds. Decreased breath sounds present.  Abdominal:     Tenderness: There is abdominal tenderness.     Comments: Distended, firm  Musculoskeletal:     Right lower leg: No edema.     Left lower leg: No edema.  Skin:    General: Skin is warm.  Neurological:     Mental Status: He is alert and oriented to person, place, and  time. Mental status is at baseline.  Psychiatric:        Mood and Affect: Mood normal.    ED Results / Procedures / Treatments   Labs (all labs ordered are listed, but only abnormal results are displayed) Labs Reviewed  COMPREHENSIVE METABOLIC PANEL - Abnormal; Notable for the following components:      Result Value   BUN 34 (*)    Creatinine, Ser 2.61 (*)    Calcium 10.7 (*)    GFR, Estimated 27 (*)    All other components within normal limits  CBC - Abnormal; Notable for the  following components:   Platelets 415 (*)    All other components within normal limits  LACTIC ACID, PLASMA - Abnormal; Notable for the following components:   Lactic Acid, Venous 2.1 (*)    All other components within normal limits  LIPASE, BLOOD  LACTIC ACID, PLASMA  URINALYSIS, ROUTINE W REFLEX MICROSCOPIC    EKG EKG Interpretation  Date/Time:  Friday September 06 2021 12:51:51 EST Ventricular Rate:  111 PR Interval:  180 QRS Duration: 101 QT Interval:  325 QTC Calculation: 442 R Axis:   244 Text Interpretation: Right and left arm electrode reversal, interpretation assumes no reversal Sinus tachycardia Inferior infarct, old Probable lateral infarct, age indeterminate no STEMI. NO SIG CHANGE FROM OLD Confirmed by Charlesetta Shanks [phone]) on 09/06/2021 1:01:58 PM  Radiology DG Chest 2 View  Result Date: 09/06/2021 CLINICAL DATA:  Shortness of breath EXAM: CHEST - 2 VIEW COMPARISON:  Chest radiograph 03/06/2018, CT chest 09/04/2021 FINDINGS: The cardiomediastinal silhouette is within normal limits. There is a moderate size right pleural effusion with adjacent opacity in the right base likely reflecting atelectasis. The right upper lung is well-aerated. There is a smaller left pleural effusion. The left lung is otherwise clear. There is no pneumothorax. There is no acute osseous abnormality. IMPRESSION: Moderate right and small left pleural effusions with adjacent right basilar atelectasis.  Electronically Signed   By: Valetta Mole M.D.   On: 09/06/2021 13:52    Procedures Procedures   Medications Ordered in ED Medications - No data to display  ED Course  I have reviewed the triage vital signs and the nursing notes.  Pertinent labs & imaging results that were available during my care of the patient were reviewed by me and considered in my medical decision making (see chart for details).    MDM Rules/Calculators/A&P                           60 year old male presents emergency department worsening abdominal swelling and shortness of breath.  He is tachycardic on arrival.  Cannot take a full deep breath but in no respiratory distress.  Saturating 97% on room air.  Abdomen is distended, firm, slightly tender.  Blood work is reassuring, white count is normal, kidney dysfunction is slightly uptrending.  Liver function is normal.  Lactic is 1.8.  Patient is afebrile.  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  It also identifies pleural effusions right worse than left.  Patient will require a paracentesis and thoracentesis and admission for further evaluation.  We will attempt a mild diuresis, patient otherwise stable.  Patients evaluation and results requires admission for further treatment and care. Patient agrees with admission plan, offers no new complaints and is stable/unchanged at time of admit.  Final Clinical Impression(s) / ED Diagnoses Final diagnoses:  None    Rx / DC Orders ED Discharge Orders     None        Lorelle Gibbs, DO 09/06/21 1541

## 2021-09-06 NOTE — ED Notes (Signed)
Pt ambulatory to bathroom on RA, no assistance needed.

## 2021-09-06 NOTE — ED Notes (Signed)
Pt provided ginger ale and apple juice, per his request. EDP [place] aware and agreeable.

## 2021-09-07 ENCOUNTER — Inpatient Hospital Stay (HOSPITAL_COMMUNITY): Payer: 59

## 2021-09-07 DIAGNOSIS — G4733 Obstructive sleep apnea (adult) (pediatric): Secondary | ICD-10-CM | POA: Diagnosis present

## 2021-09-07 DIAGNOSIS — R609 Edema, unspecified: Secondary | ICD-10-CM

## 2021-09-07 DIAGNOSIS — R000 Tachycardia, unspecified: Secondary | ICD-10-CM | POA: Diagnosis present

## 2021-09-07 DIAGNOSIS — N179 Acute kidney failure, unspecified: Secondary | ICD-10-CM | POA: Diagnosis present

## 2021-09-07 DIAGNOSIS — E66813 Obesity, class 3: Secondary | ICD-10-CM | POA: Diagnosis present

## 2021-09-07 DIAGNOSIS — R591 Generalized enlarged lymph nodes: Secondary | ICD-10-CM

## 2021-09-07 DIAGNOSIS — R188 Other ascites: Secondary | ICD-10-CM

## 2021-09-07 DIAGNOSIS — J90 Pleural effusion, not elsewhere classified: Secondary | ICD-10-CM | POA: Diagnosis present

## 2021-09-07 LAB — CBC WITH DIFFERENTIAL/PLATELET
Abs Immature Granulocytes: 0.06 10*3/uL (ref 0.00–0.07)
Basophils Absolute: 0.1 10*3/uL (ref 0.0–0.1)
Basophils Relative: 1 %
Eosinophils Absolute: 0.2 10*3/uL (ref 0.0–0.5)
Eosinophils Relative: 3 %
HCT: 37.7 % — ABNORMAL LOW (ref 39.0–52.0)
Hemoglobin: 12.5 g/dL — ABNORMAL LOW (ref 13.0–17.0)
Immature Granulocytes: 1 %
Lymphocytes Relative: 10 %
Lymphs Abs: 0.7 10*3/uL (ref 0.7–4.0)
MCH: 27.5 pg (ref 26.0–34.0)
MCHC: 33.2 g/dL (ref 30.0–36.0)
MCV: 82.9 fL (ref 80.0–100.0)
Monocytes Absolute: 1.1 10*3/uL — ABNORMAL HIGH (ref 0.1–1.0)
Monocytes Relative: 15 %
Neutro Abs: 5.2 10*3/uL (ref 1.7–7.7)
Neutrophils Relative %: 70 %
Platelets: 406 10*3/uL — ABNORMAL HIGH (ref 150–400)
RBC: 4.55 MIL/uL (ref 4.22–5.81)
RDW: 13.8 % (ref 11.5–15.5)
WBC: 7.4 10*3/uL (ref 4.0–10.5)
nRBC: 0 % (ref 0.0–0.2)

## 2021-09-07 LAB — PROTEIN, PLEURAL OR PERITONEAL FLUID: Total protein, fluid: 4.9 g/dL

## 2021-09-07 LAB — GRAM STAIN

## 2021-09-07 LAB — COMPREHENSIVE METABOLIC PANEL
ALT: 28 U/L (ref 0–44)
AST: 36 U/L (ref 15–41)
Albumin: 3.5 g/dL (ref 3.5–5.0)
Alkaline Phosphatase: 59 U/L (ref 38–126)
Anion gap: 13 (ref 5–15)
BUN: 35 mg/dL — ABNORMAL HIGH (ref 6–20)
CO2: 21 mmol/L — ABNORMAL LOW (ref 22–32)
Calcium: 9.4 mg/dL (ref 8.9–10.3)
Chloride: 101 mmol/L (ref 98–111)
Creatinine, Ser: 3.03 mg/dL — ABNORMAL HIGH (ref 0.61–1.24)
GFR, Estimated: 23 mL/min — ABNORMAL LOW (ref >60)
Glucose, Bld: 99 mg/dL (ref 70–99)
Potassium: 4.6 mmol/L (ref 3.5–5.1)
Sodium: 135 mmol/L (ref 135–145)
Total Bilirubin: 0.9 mg/dL (ref 0.3–1.2)
Total Protein: 6.8 g/dL (ref 6.5–8.1)

## 2021-09-07 LAB — LACTATE DEHYDROGENASE: LDH: 153 U/L (ref 98–192)

## 2021-09-07 LAB — HIV ANTIBODY (ROUTINE TESTING W REFLEX): HIV Screen 4th Generation wRfx: NONREACTIVE

## 2021-09-07 LAB — LACTATE DEHYDROGENASE, PLEURAL OR PERITONEAL FLUID: LD, Fluid: 100 U/L — ABNORMAL HIGH (ref 3–23)

## 2021-09-07 MED ORDER — LIDOCAINE HCL 1 % IJ SOLN
INTRAMUSCULAR | Status: AC
  Administered 2021-09-07: 10 mL
  Filled 2021-09-07: qty 20

## 2021-09-07 NOTE — Assessment & Plan Note (Signed)
Followed up with Dr. Risa Grill in the past.  Had seeds implanted and radiation in the past.  Currently not following up as in remission.

## 2021-09-07 NOTE — Assessment & Plan Note (Signed)
Likely in response to respiratory distress from severe ascites.  Monitor.

## 2021-09-07 NOTE — Assessment & Plan Note (Signed)
Per report it is more likely lymphoma like picture although the patient's history of prostate cancer metastatic disease cannot be ruled out as well.  Further work-up will require biopsy.

## 2021-09-07 NOTE — Assessment & Plan Note (Signed)
Placing the patient at high risk for poor outcome. Continue CPAP nightly. Body mass index is 45.68 kg/m.

## 2021-09-07 NOTE — Progress Notes (Signed)
PROGRESS NOTE    Kenneth Wiggins  DDU:[id number] DOB: Jun 07, 1961 DOA: 09/06/2021 PCP: Deland Pretty, MD    Chief Complaint  Patient presents with   Shortness of Breath    Brief Narrative:   Kenneth Wiggins is a 60 y.o. male with medical history significant of asthma, prostate cancer, GERD, HTN, hiatal hernia.  Presented with complaints of worsening shortness of breath  Subjective:  Report feeling better after thoracentesis, denies pain, no fever Persistent sinus tachycardia, denies chest pain, no hypoxia  Assessment & Plan:   Principal Problem:   Ascites Active Problems:   Prostate cancer (HCC)   Sinus tachycardia   OSA (obstructive sleep apnea)   Obesity, Class III, BMI 40-49.9 (morbid obesity) (HCC)   Pleural effusion on right   Lymphadenopathy   AKI (acute kidney injury) ([place])   Ascites/pleural effusion on the right -Suspect malignancy in nature -Underwent right-sided thoracentesis, fluid study pending, per ultrasound not enough /not safe to tap ascites   Soft tissue mass/lymphadenopathy Suspect lymphoma, will require biopsy, will discuss with IR to best place to biopsy  Lactic acidosis Resolved  AKI on CKDII Creatinine baseline appear around 1.3 Creatinine on admission 2.6, today 3.0 UA unremarkable CT abdomen did not reveal obstructive nephropathy He did receive CT with contrast, cannot rule out contrast nephropathy Encourage oral intake, may need hydration if continue to trend up Monitor BMP, renal dosing meds  Sinus tachycardia Likely reactive, denies chest pain, no hypoxia Will check TSH  Class III obesity/OSA on CPAP : Body mass index is 45.69 kg/m.Marland Kitchen      Unresulted Labs (From admission, onward)     Start     Ordered   09/08/21 0500  PSA  Tomorrow morning,   R        09/07/21 1825   09/08/21 0500  Lactate dehydrogenase  Tomorrow morning,   R        09/07/21 1825   09/08/21 2376  Basic metabolic panel  Daily,   R      09/07/21 1825    09/08/21 0500  TSH  Tomorrow morning,   R        09/07/21 1829   09/08/21 0500  T4, free  Tomorrow morning,   R        09/07/21 1829   09/07/21 0925  Culture, body fluid w Gram Stain-bottle  Once,   R        09/07/21 0925   09/07/21 0925  Gram stain  Once,   R        09/07/21 0925   09/07/21 0500  CBC with Differential/Platelet  Daily,   R      09/07/21 0029              DVT prophylaxis: heparin injection 5,000 Units Start: 09/06/21 2200   Code Status: Full Family Communication: Patient Disposition:   Status is: Inpatient  Dispo: The patient is from: Home              Anticipated d/c is to: Home              Anticipated d/c date is: To be determined                Consultants:  IR  Procedures:  Right thoracentesis  Antimicrobials:   Anti-infectives (From admission, onward)    None           Objective: Vitals:   09/07/21 0855 09/07/21 0919 09/07/21 0931 09/07/21 1332  BP: 102/81 109/69 108/71 102/66  Pulse:  (!) 102 99 (!) 110  Resp:  15 18 16   Temp:  98.6 F (37 C)  98.3 F (36.8 C)  TempSrc:  Oral  Oral  SpO2:  100% 100% 99%  Weight:      Height:       No intake or output data in the 24 hours ending 09/07/21 [place]   09/06/21 1222 09/06/21 1938 09/07/21 0401  Weight: (!) 146.7 kg (!) 148.6 kg (!) 148.6 kg    Examination:  General exam: alert, awake, communicative,calm, NAD Respiratory system: improved aeration right lung. Respiratory effort normal. Cardiovascular system:   sinus tachycardia Gastrointestinal system: Abdomen is slightly distended, soft and nontender.  Normal bowel sounds heard. Central nervous system: Alert and oriented. No focal neurological deficits. Extremities:  no edema Skin: No rashes, lesions or ulcers Psychiatry: Judgement and insight appear normal. Mood & affect appropriate.     Data Reviewed: I have personally reviewed following labs and imaging studies  CBC: Recent Labs  Lab 09/06/21 1241  09/07/21 0351  WBC 8.1 7.4  NEUTROABS  --  5.2  HGB 13.3 12.5*  HCT 39.7 37.7*  MCV 80.7 82.9  PLT 415* 406*    Basic Metabolic Panel: Recent Labs  Lab 09/06/21 1241 09/07/21 0351  NA 140 135  K 4.4 4.6  CL 102 101  CO2 27 21*  GLUCOSE 97 99  BUN 34* 35*  CREATININE 2.61* 3.03*  CALCIUM 10.7* 9.4    GFR: Estimated Creatinine Clearance: 38.4 mL/min (A) (by C-G formula based on SCr of 3.03 mg/dL (H)).  Liver Function Tests: Recent Labs  Lab 09/06/21 1241 09/07/21 0351  AST 23 36  ALT 20 28  ALKPHOS 66 59  BILITOT 0.8 0.9  PROT 7.0 6.8  ALBUMIN 4.2 3.5    CBG: No results for input(s): GLUCAP in the last 168 hours.   Recent Results (from the past 240 hour(s))  Resp Panel by RT-PCR (Flu A&B, Covid) Nasopharyngeal Swab     Status: None   Collection Time: 09/06/21  3:48 PM   Specimen: Nasopharyngeal Swab; Nasopharyngeal(NP) swabs in vial transport medium  Result Value Ref Range Status   SARS Coronavirus 2 by RT PCR NEGATIVE NEGATIVE Final    Comment: (NOTE) SARS-CoV-2 target nucleic acids are NOT DETECTED.  The SARS-CoV-2 RNA is generally detectable in upper respiratory specimens during the acute phase of infection. The lowest concentration of SARS-CoV-2 viral copies this assay can detect is 138 copies/mL. A negative result does not preclude SARS-Cov-2 infection and should not be used as the sole basis for treatment or other patient management decisions. A negative result may occur with  improper specimen collection/handling, submission of specimen other than nasopharyngeal swab, presence of viral mutation(s) within the areas targeted by this assay, and inadequate number of viral copies(<138 copies/mL). A negative result must be combined with clinical observations, patient history, and epidemiological information. The expected result is Negative.  Fact Sheet for Patients:  EntrepreneurPulse.com.au  Fact Sheet for Healthcare Providers:   IncredibleEmployment.be  This test is no t yet approved or cleared by the [place] FDA and  has been authorized for detection and/or diagnosis of SARS-CoV-2 by FDA under an Emergency Use Authorization (EUA). This EUA will remain  in effect (meaning this test can be used) for the duration of the COVID-19 declaration under Section 564(b)(1) of the Act, 21 U.S.C.section 360bbb-3(b)(1), unless the authorization is terminated  or revoked sooner.  Influenza A by PCR NEGATIVE NEGATIVE Final   Influenza B by PCR NEGATIVE NEGATIVE Final    Comment: (NOTE) The Xpert Xpress SARS-CoV-2/FLU/RSV plus assay is intended as an aid in the diagnosis of influenza from Nasopharyngeal swab specimens and should not be used as a sole basis for treatment. Nasal washings and aspirates are unacceptable for Xpert Xpress SARS-CoV-2/FLU/RSV testing.  Fact Sheet for Patients: EntrepreneurPulse.com.au  Fact Sheet for Healthcare Providers: IncredibleEmployment.be  This test is not yet approved or cleared by the [place] FDA and has been authorized for detection and/or diagnosis of SARS-CoV-2 by FDA under an Emergency Use Authorization (EUA). This EUA will remain in effect (meaning this test can be used) for the duration of the COVID-19 declaration under Section 564(b)(1) of the Act, 21 U.S.C. section 360bbb-3(b)(1), unless the authorization is terminated or revoked.  Performed at [facility], [address] [place], [place] [address]          Radiology Studies: DG Chest 1 View  Result Date: 09/07/2021 CLINICAL DATA:  Right thoracentesis EXAM: CHEST  1 VIEW COMPARISON:  Yesterday FINDINGS: Normal heart size and mediastinal contours. No acute infiltrate or edema. No effusion or pneumothorax. No acute osseous findings. IMPRESSION: No evidence of thoracentesis complication. No visible residual fluid. Electronically  Signed   By: Jorje Guild M.D.   On: 09/07/2021 09:19   DG Chest 2 View  Result Date: 09/06/2021 CLINICAL DATA:  Shortness of breath EXAM: CHEST - 2 VIEW COMPARISON:  Chest radiograph 03/06/2018, CT chest 09/04/2021 FINDINGS: The cardiomediastinal silhouette is within normal limits. There is a moderate size right pleural effusion with adjacent opacity in the right base likely reflecting atelectasis. The right upper lung is well-aerated. There is a smaller left pleural effusion. The left lung is otherwise clear. There is no pneumothorax. There is no acute osseous abnormality. IMPRESSION: Moderate right and small left pleural effusions with adjacent right basilar atelectasis. Electronically Signed   By: Valetta Mole M.D.   On: 09/06/2021 13:52   [place] ASCITES (ABDOMEN LIMITED)  Result Date: 09/07/2021 CLINICAL DATA:  History of prostate cancer, now with bulky retroperitoneal and pelvic lymphadenopathy worrisome for lymphoma versus metastatic disease. Please perform image guided biopsy for tissue diagnostic purposes. EXAM: LIMITED ABDOMEN ULTRASOUND FOR ASCITES TECHNIQUE: Limited ultrasound survey for ascites was performed in all four abdominal quadrants. COMPARISON:  CT abdomen and pelvis-09/03/2021 FINDINGS: Sonographic evaluation of the abdomen demonstrates a trace amount of intra-abdominal ascites, too small to allow for safe ultrasound-guided paracentesis. No paracentesis attempted. IMPRESSION: Trace amount of intra-abdominal ascites, too small to allow for safe ultrasound-guided paracentesis. No paracentesis attempted. Electronically Signed   By: Sandi Mariscal M.D.   On: 09/07/2021 10:46   VAS [place] LOWER EXTREMITY VENOUS (DVT)  Result Date: 09/07/2021  Lower Venous DVT Study Patient Name:  YEHOSHUA VITELLI  Date of Exam:   09/07/2021 Medical Rec #: [id number]        Accession #:    [id number] Date of Birth: Dec 19, 1960         Patient Gender: M Patient Age:   71 years Exam Location[facility]  Procedure:      VAS [place] LOWER EXTREMITY VENOUS (DVT) Referring Phys: Annamaria Boots Stefana Lodico --------------------------------------------------------------------------------  Indications: Edema, abdomen distention w/ ascites, history of prostate cancer.  Limitations: Body habitus and poor ultrasound/tissue interface. Comparison Study: No prior studies. Performing Technologist: Darlin Coco RDMS, RVT  Examination Guidelines: A complete evaluation includes B-mode imaging, spectral Doppler, color Doppler, and  power Doppler as needed of all accessible portions of each vessel. Bilateral testing is considered an integral part of a complete examination. Limited examinations for reoccurring indications may be performed as noted. The reflux portion of the exam is performed with the patient in reverse Trendelenburg.  +---------+---------------+---------+-----------+----------+--------------+ RIGHT    CompressibilityPhasicitySpontaneityPropertiesThrombus Aging +---------+---------------+---------+-----------+----------+--------------+ CFV      Full           Yes      Yes                                 +---------+---------------+---------+-----------+----------+--------------+ SFJ      Full                                                        +---------+---------------+---------+-----------+----------+--------------+ FV Prox  Full                                                        +---------+---------------+---------+-----------+----------+--------------+ FV Mid   Full                                                        +---------+---------------+---------+-----------+----------+--------------+ FV DistalFull                                                        +---------+---------------+---------+-----------+----------+--------------+ PFV      Full                                                        +---------+---------------+---------+-----------+----------+--------------+ POP       Full           Yes      Yes                                 +---------+---------------+---------+-----------+----------+--------------+ PTV      Full                                                        +---------+---------------+---------+-----------+----------+--------------+ PERO     Full                                                        +---------+---------------+---------+-----------+----------+--------------+  Gastroc  Full                                                        +---------+---------------+---------+-----------+----------+--------------+   +---------+---------------+---------+-----------+----------+-------------------+ LEFT     CompressibilityPhasicitySpontaneityPropertiesThrombus Aging      +---------+---------------+---------+-----------+----------+-------------------+ CFV      Full           Yes      Yes                                      +---------+---------------+---------+-----------+----------+-------------------+ SFJ      Full                                                             +---------+---------------+---------+-----------+----------+-------------------+ FV Prox  Full                                                             +---------+---------------+---------+-----------+----------+-------------------+ FV Mid   Full                                                             +---------+---------------+---------+-----------+----------+-------------------+ FV DistalFull                                                             +---------+---------------+---------+-----------+----------+-------------------+ PFV      Full                                                             +---------+---------------+---------+-----------+----------+-------------------+ POP      Full                                                              +---------+---------------+---------+-----------+----------+-------------------+ PTV      Full                                                             +---------+---------------+---------+-----------+----------+-------------------+  PERO     Full                                         Not well visualized +---------+---------------+---------+-----------+----------+-------------------+ Gastroc  Full                                                             +---------+---------------+---------+-----------+----------+-------------------+    Summary: RIGHT: - There is no evidence of deep vein thrombosis in the lower extremity.  - No cystic structure found in the popliteal fossa.  LEFT: - There is no evidence of deep vein thrombosis in the lower extremity. However, portions of this examination were limited- see technologist comments above.  - No cystic structure found in the popliteal fossa.  *See table(s) above for measurements and observations.    Preliminary    US THORACENTESIS ASP PLEURAL SPACE W/IMG GUIDE  Result Date: 09/07/2021 INDICATION: Shortness of breath. Right-sided pleural effusion. Request therapeutic and diagnostic thoracentesis. EXAM: ULTRASOUND GUIDED RIGHT THORACENTESIS MEDICATIONS: 1% plain lidocaine, 5 mL COMPLICATIONS: None immediate. PROCEDURE: An ultrasound guided thoracentesis was thoroughly discussed with the patient and questions answered. The benefits, risks, alternatives and complications were also discussed. The patient understands and wishes to proceed with the procedure. Written consent was obtained. Ultrasound was performed to localize and mark an adequate pocket of fluid in the right chest. The area was then prepped and draped in the normal sterile fashion. 1% Lidocaine was used for local anesthesia. Under ultrasound guidance a 6 Fr Safe-T-Centesis catheter was introduced. Thoracentesis was performed. The catheter was removed and a dressing applied.  FINDINGS: A total of approximately 1.3 L of clear yellow fluid was removed. Samples were sent to the laboratory as requested by the clinical team. IMPRESSION: Successful ultrasound guided right thoracentesis yielding 1.3 L of pleural fluid. Read by: Ascencion Dike PA-C Electronically Signed   By: Sandi Mariscal M.D.   On: 09/07/2021 10:44        Scheduled Meds:  heparin  5,000 Units Subcutaneous Q8H   Continuous Infusions:   LOS: 1 day   Time spent: 67mins Greater than 50% of this time was spent in counseling, explanation of diagnosis, planning of further management, and coordination of care.   Voice Recognition Viviann Spare dictation system was used to create this note, attempts have been made to correct errors. Please contact the author with questions and/or clarifications.   Florencia Reasons, MD PhD FACP [facility]  Available via Epic secure chat 7am-7pm for nonurgent issues Please page for urgent issues To page the attending provider between 7A-7P or the covering provider during after hours 7P-7A, please log into the web site [email or web address] and access using universal [place] password for that web site. If you do not have the password, please call the hospital operator.    09/07/2021, 6:31 PM

## 2021-09-07 NOTE — Progress Notes (Addendum)
Transporting patient to Ultrasound for Thoracentesis via Bed.

## 2021-09-07 NOTE — Assessment & Plan Note (Signed)
See above

## 2021-09-07 NOTE — Procedures (Signed)
PROCEDURE SUMMARY:  Successful US guided right thoracentesis. Yielded 1.3 L of clear yellow fluid. Pt tolerated procedure well. No immediate complications.  Specimen was sent for labs. CXR ordered.  EBL < 5 mL  Ascencion Dike PA-C 09/07/2021 9:23 AM

## 2021-09-07 NOTE — Assessment & Plan Note (Signed)
Order thoracentesis.

## 2021-09-07 NOTE — Assessment & Plan Note (Signed)
Renal function significantly worse. Likely combination of ongoing use of olmesartan and diuretic Lasix as well as poor p.o. intake monitor after receiving diuretics.

## 2021-09-07 NOTE — Assessment & Plan Note (Signed)
Reason for visit worsening abdominal distention almost for last 2 to 3 weeks.  Patient actually had weeping from platelets umbilicus. Currently all resolved. Requires further work-up. I will order thoracentesis as well as paracentesis.

## 2021-09-07 NOTE — H&P (Addendum)
History and Physical    Kenneth Wiggins NLG:[id number] DOB: 06/20/1961 DOA: 09/06/2021  PCP: Deland Pretty, MD   Chief Complaint: Shortness of breath  HPI: Kenneth Wiggins is a 60 y.o. male with medical history significant of asthma, prostate cancer, GERD, HTN, hiatal hernia.  Presented with complaints of worsening shortness of breath. Also reports swelling of her stomach. Patient actually had swelling of her stomach few more weeks ago.  Went to see PCP who ordered an ultrasound (reportedly it was unremarkable). Patient was then referred to GI, Dr. Watt Climes.  Underwent CT abdomen pelvis. CT abdomen shows evidence of lymphadenopathy as well as ascites.  Patient was referred to our biopsy although for last few days progressively symptoms become worse and therefore patient decided to come to the hospital.  Review of Systems: ROS   As per HPI otherwise 10 point review of systems negative.   No Known Allergies  Past Medical History:  Diagnosis Date   Ascites    Asthma as child   hx of   Cancer [facility])    prostate - psa 3.93 on 07/06/2012   GERD (gastroesophageal reflux disease)    hx of   H/O hiatal hernia 15 years ago   Hypertension    Prostate cancer ([place]) 08/09/2012   Gleason 3+3=6, vol 20-25 gm   Prostate cancer ([place]) 01/10/2013   Seed implant to be done due to Robotic surgery aborted   Sleep apnea    CPAP    Past Surgical History:  Procedure Laterality Date   CARPAL TUNNEL RELEASE Right 04/03/2021   Procedure: RIGHT CARPAL TUNNEL RELEASE;  Surgeon: Leandrew Koyanagi, MD;  Location: [place];  Service: Orthopedics;  Laterality: Right;   HERNIA REPAIR  as child   KNEE SURGERY Left as child   tore a ligament    MASS EXCISION Right 03/07/2021   Procedure: EXCISION MASS OF RIGHT CHEST;  Surgeon: Johnathan Hausen, MD;  Location: [facility];  Service: General;  Laterality: Right;   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   RADIOACTIVE SEED IMPLANT N/A 01/17/2013   Procedure: RADIOACTIVE  SEED IMPLANT;  Surgeon: Bernestine Amass, MD;  Location: [facility];  Service: Urology;  Laterality: N/A;  W/ MURRAY    ROBOT ASSISTED LAPAROSCOPIC RADICAL PROSTATECTOMY N/A 11/24/2012   Procedure: Attempted ROBOTIC ASSISTED LAPAROSCOPIC RADICAL PROSTATECTOMY, Abandoned;  Surgeon: Bernestine Amass, MD;  Location: [facility];  Service: Urology;  Laterality: N/A;   scrotum explotion Left    SHOULDER ARTHROSCOPY WITH ROTATOR CUFF REPAIR AND SUBACROMIAL DECOMPRESSION Right 04/03/2021   Procedure: RIGHT SHOULDER ARTHROSCOPY ROTATOR CUFF REPAIR, SUBACROMIAL DECOMPRESSION, EXTENSIVE DEBRIDEMENT;  Surgeon: Leandrew Koyanagi, MD;  Location: [place];  Service: Orthopedics;  Laterality: Right;   testical growth removed     VASECTOMY       reports that he has never smoked. He has never used smokeless tobacco. He reports that he does not drink alcohol and does not use drugs.  Family History  Problem Relation Age of Onset   Cancer Maternal Aunt        colon   Cancer Paternal Uncle        prostate, 2 uncles   Cancer Daughter        angiosarcoma age 23   Prostate cancer Other     Prior to Admission medications   Medication Sig Start Date End Date Taking? Authorizing Provider  amLODipine (NORVASC) 10 MG tablet Take 10 mg by mouth daily.   Yes [provider]  chlorthalidone (HYGROTON) 25 MG tablet Take 25 mg by mouth daily.   Yes [provider]  Multiple Vitamins-Minerals (MULTIVITAMIN WITH MINERALS) tablet Take 1 tablet by mouth daily.   Yes [provider]  olmesartan (BENICAR) 40 MG tablet Take 40 mg by mouth daily.   Yes [provider]  oxyCODONE-acetaminophen (PERCOCET) 5-325 MG tablet Take 1-2 tablets by mouth every 8 (eight) hours as needed for severe pain. Patient not taking: Reported on 09/06/2021 04/03/21   Leandrew Koyanagi, MD    Physical Exam: Vitals:   09/06/21 1941 09/06/21 2049 09/06/21 2212 09/06/21 2350  BP: 114/70  104/78 93/61  Pulse: (!) 113  (!) 112 (!) 104   Resp: 20  18 18   Temp: 98.5 F (36.9 C)  98 F (36.7 C) 98.4 F (36.9 C)  TempSrc: Oral  Oral Oral  SpO2: 98% 98% 100% 97%  Weight:      Height:       General: Appear in mild distress, no Rash; Oral Mucosa Clear, moist. no Abnormal Neck Mass Or lumps, Conjunctiva normal  Cardiovascular: S1 and S2 Present, no Murmur, Respiratory: good respiratory effort, Bilateral Air entry present and CTA, no Crackles, no wheezes Abdomen: Bowel Sound present, Soft and no tenderness Extremities: no Pedal edema Neurology: alert and oriented to time, place, and person affect appropriate. no new focal deficit Gait not checked due to patient safety concerns      Labs on Admission: I have personally reviewed the patients's labs and imaging studies.  Assessment/Plan * Ascites Reason for visit worsening abdominal distention almost for last 2 to 3 weeks.  Patient actually had weeping from platelets umbilicus. Currently all resolved. Requires further work-up. I will order thoracentesis as well as paracentesis.   Lymphadenopathy Per report it is more likely lymphoma like picture although the patient's history of prostate cancer metastatic disease cannot be ruled out as well.  Further work-up will require biopsy.  Pleural effusion on right Order thoracentesis.  Obesity, Class III, BMI 40-49.9 (morbid obesity) ([place]) Placing the patient at high risk for poor outcome. Continue CPAP nightly. Body mass index is 45.68 kg/m.   OSA (obstructive sleep apnea) See above  Prostate cancer ([place]) Followed up with Dr. Risa Grill in the past.  Had seeds implanted and radiation in the past.  Currently not following up as in remission.  AKI (acute kidney injury) ([place]) Renal function significantly worse. Likely combination of ongoing use of olmesartan and diuretic Lasix as well as poor p.o. intake monitor after receiving diuretics.  Sinus tachycardia Likely in response to respiratory distress from severe ascites.   Monitor.      Code Status: Full Code   DVT Prophylaxis:   heparin injection 5,000 Units Start: 09/06/21 2200   Family Communication: Aunt at bedside Admission status: Inpatient Telemetry  Certification: The appropriate patient status for this patient is INPATIENT. Inpatient status is judged to be reasonable and necessary in order to provide the required intensity of service to ensure the patient's safety. The patient's presenting symptoms, physical exam findings, and initial radiographic and laboratory data in the context of their chronic comorbidities is felt to place them at high risk for further clinical deterioration. Furthermore, it is not anticipated that the patient will be medically stable for discharge from the hospital within 2 midnights of admission.   * I certify that at the point of admission it is my clinical judgment that the patient will require inpatient hospital care spanning beyond 2 midnights from  the point of admission due to high intensity of service, high risk for further deterioration and high frequency of surveillance required.Berle Mull MD [facility] If 7PM-7AM, please contact night-coverage [email or web address]  09/07/2021, 12:35 AM

## 2021-09-08 DIAGNOSIS — R591 Generalized enlarged lymph nodes: Secondary | ICD-10-CM | POA: Diagnosis not present

## 2021-09-08 DIAGNOSIS — N179 Acute kidney failure, unspecified: Secondary | ICD-10-CM | POA: Diagnosis not present

## 2021-09-08 DIAGNOSIS — R188 Other ascites: Secondary | ICD-10-CM | POA: Diagnosis not present

## 2021-09-08 LAB — CBC WITH DIFFERENTIAL/PLATELET
Abs Immature Granulocytes: 0.04 10*3/uL (ref 0.00–0.07)
Basophils Absolute: 0 10*3/uL (ref 0.0–0.1)
Basophils Relative: 1 %
Eosinophils Absolute: 0.2 10*3/uL (ref 0.0–0.5)
Eosinophils Relative: 3 %
HCT: 36.6 % — ABNORMAL LOW (ref 39.0–52.0)
Hemoglobin: 11.9 g/dL — ABNORMAL LOW (ref 13.0–17.0)
Immature Granulocytes: 1 %
Lymphocytes Relative: 12 %
Lymphs Abs: 0.8 10*3/uL (ref 0.7–4.0)
MCH: 27 pg (ref 26.0–34.0)
MCHC: 32.5 g/dL (ref 30.0–36.0)
MCV: 83.2 fL (ref 80.0–100.0)
Monocytes Absolute: 1 10*3/uL (ref 0.1–1.0)
Monocytes Relative: 16 %
Neutro Abs: 4.3 10*3/uL (ref 1.7–7.7)
Neutrophils Relative %: 67 %
Platelets: 361 10*3/uL (ref 150–400)
RBC: 4.4 MIL/uL (ref 4.22–5.81)
RDW: 13.6 % (ref 11.5–15.5)
WBC: 6.3 10*3/uL (ref 4.0–10.5)
nRBC: 0 % (ref 0.0–0.2)

## 2021-09-08 LAB — BODY FLUID CELL COUNT WITH DIFFERENTIAL
Eos, Fluid: 2 %
Lymphs, Fluid: 32 %
Monocyte-Macrophage-Serous Fluid: 57 % (ref 50–90)
Neutrophil Count, Fluid: 9 % (ref 0–25)
Total Nucleated Cell Count, Fluid: 1882 cu mm — ABNORMAL HIGH (ref 0–1000)

## 2021-09-08 LAB — BASIC METABOLIC PANEL
Anion gap: 12 (ref 5–15)
BUN: 45 mg/dL — ABNORMAL HIGH (ref 6–20)
CO2: 24 mmol/L (ref 22–32)
Calcium: 9.1 mg/dL (ref 8.9–10.3)
Chloride: 101 mmol/L (ref 98–111)
Creatinine, Ser: 3.12 mg/dL — ABNORMAL HIGH (ref 0.61–1.24)
GFR, Estimated: 22 mL/min — ABNORMAL LOW (ref >60)
Glucose, Bld: 104 mg/dL — ABNORMAL HIGH (ref 70–99)
Potassium: 4.2 mmol/L (ref 3.5–5.1)
Sodium: 137 mmol/L (ref 135–145)

## 2021-09-08 LAB — PSA: Prostatic Specific Antigen: <0.01 ng/mL (ref 0.00–4.00)

## 2021-09-08 LAB — TSH: TSH: 2.959 u[IU]/mL (ref 0.350–4.500)

## 2021-09-08 LAB — T4, FREE: Free T4: 1.31 ng/dL — ABNORMAL HIGH (ref 0.61–1.12)

## 2021-09-08 LAB — LACTATE DEHYDROGENASE: LDH: 138 U/L (ref 98–192)

## 2021-09-08 NOTE — Progress Notes (Signed)
Chief Complaint: Patient was seen in consultation today for LN biopsy  Referring Physician(s): Dr. Florencia Reasons  Supervising Physician: Sandi Mariscal  Patient Status: [facility] - In-pt  History of Present Illness: Kenneth Wiggins is a 60 y.o. male admitted with SOB. Found to have diffuse adenopathy as well as large right pleural effusion. Underwent large volume thoracentesis with symptomatic relief. Labs pending. Abd distention secondary to bulky adenopathy and small volume ascites. Not amenable to paracentesis. IR is asked to perform LN biopsy. PMHx, meds, labs, imaging, allergies reviewed. Feels better post thora.     Past Medical History:  Diagnosis Date   Ascites    Asthma as child   hx of   Cancer [facility])    prostate - psa 3.93 on 07/06/2012   GERD (gastroesophageal reflux disease)    hx of   H/O hiatal hernia 15 years ago   Hypertension    Prostate cancer ([place]) 08/09/2012   Gleason 3+3=6, vol 20-25 gm   Prostate cancer ([place]) 01/10/2013   Seed implant to be done due to Robotic surgery aborted   Sleep apnea    CPAP    Past Surgical History:  Procedure Laterality Date   CARPAL TUNNEL RELEASE Right 04/03/2021   Procedure: RIGHT CARPAL TUNNEL RELEASE;  Surgeon: Leandrew Koyanagi, MD;  Location: [place];  Service: Orthopedics;  Laterality: Right;   HERNIA REPAIR  as child   KNEE SURGERY Left as child   tore a ligament    MASS EXCISION Right 03/07/2021   Procedure: EXCISION MASS OF RIGHT CHEST;  Surgeon: Johnathan Hausen, MD;  Location: [facility];  Service: General;  Laterality: Right;   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   RADIOACTIVE SEED IMPLANT N/A 01/17/2013   Procedure: RADIOACTIVE SEED IMPLANT;  Surgeon: Bernestine Amass, MD;  Location: [facility];  Service: Urology;  Laterality: N/A;  W/ MURRAY    ROBOT ASSISTED LAPAROSCOPIC RADICAL PROSTATECTOMY N/A 11/24/2012   Procedure: Attempted ROBOTIC ASSISTED LAPAROSCOPIC RADICAL PROSTATECTOMY, Abandoned;  Surgeon: Bernestine Amass, MD;   Location: [facility];  Service: Urology;  Laterality: N/A;   scrotum explotion Left    SHOULDER ARTHROSCOPY WITH ROTATOR CUFF REPAIR AND SUBACROMIAL DECOMPRESSION Right 04/03/2021   Procedure: RIGHT SHOULDER ARTHROSCOPY ROTATOR CUFF REPAIR, SUBACROMIAL DECOMPRESSION, EXTENSIVE DEBRIDEMENT;  Surgeon: Leandrew Koyanagi, MD;  Location: [place];  Service: Orthopedics;  Laterality: Right;   testical growth removed     VASECTOMY      Allergies: Patient has no known allergies.  Medications:  Current Facility-Administered Medications:    acetaminophen (TYLENOL) tablet 650 mg, 650 mg, Oral, Q6H PRN **OR** acetaminophen (TYLENOL) suppository 650 mg, 650 mg, Rectal, Q6H PRN, Lavina Hamman, MD   heparin injection 5,000 Units, 5,000 Units, Subcutaneous, Q8H, Lavina Hamman, MD, 5,000 Units at 09/08/21 0511   ondansetron (ZOFRAN) tablet 4 mg, 4 mg, Oral, Q6H PRN **OR** ondansetron (ZOFRAN) injection 4 mg, 4 mg, Intravenous, Q6H PRN, Lavina Hamman, MD   oxyCODONE (Oxy IR/ROXICODONE) immediate release tablet 5 mg, 5 mg, Oral, Q4H PRN, Lavina Hamman, MD    Family History  Problem Relation Age of Onset   Cancer Maternal Aunt        colon   Cancer Paternal Uncle        prostate, 2 uncles   Cancer Daughter        angiosarcoma age 31   Prostate cancer Other     Social History   Socioeconomic History  Marital status: Widowed    Spouse name: Not on file   Number of children: 2   Years of education: Not on file   Highest education level: Not on file  Occupational History   Occupation: TRUCK DRIVER     Employer: [employer]  Tobacco Use   Smoking status: Never   Smokeless tobacco: Never  Vaping Use   Vaping Use: Never used  Substance and Sexual Activity   Alcohol use: No   Drug use: No   Sexual activity: Yes  Other Topics Concern   Not on file  Social History Narrative   Not on file   Social Determinants of Health   Financial Resource Strain: Not on file  Food Insecurity: Not on  file  Transportation Needs: Not on file  Physical Activity: Not on file  Stress: Not on file  Social Connections: Not on file     Review of Systems: A 12 point ROS discussed and pertinent positives are indicated in the HPI above.  All other systems are negative.  Review of Systems  Vital Signs: BP 116/72 (BP Location: Left Arm)   Pulse (!) 110   Temp 98.2 F (36.8 C) (Oral)   Resp 18   Ht 5\' 11"  (1.803 m)   Wt (!) 148.1 kg   SpO2 100%   BMI 45.54 kg/m   Physical Exam Constitutional:      General: He is not in acute distress.    Appearance: He is not ill-appearing.  HENT:     Mouth/Throat:     Mouth: Mucous membranes are moist.     Pharynx: Oropharynx is clear.  Cardiovascular:     Rate and Rhythm: Normal rate and regular rhythm.     Heart sounds: Normal heart sounds.  Pulmonary:     Effort: Pulmonary effort is normal. No respiratory distress.     Breath sounds: Normal breath sounds.  Abdominal:     General: There is distension.     Palpations: Abdomen is soft. There is no mass.     Tenderness: There is no abdominal tenderness.  Skin:    General: Skin is warm and dry.  Neurological:     General: No focal deficit present.     Mental Status: He is alert and oriented to person, place, and time.  Psychiatric:        Mood and Affect: Mood normal.        Thought Content: Thought content normal.      Imaging: DG Chest 1 View  Result Date: 09/07/2021 CLINICAL DATA:  Right thoracentesis EXAM: CHEST  1 VIEW COMPARISON:  Yesterday FINDINGS: Normal heart size and mediastinal contours. No acute infiltrate or edema. No effusion or pneumothorax. No acute osseous findings. IMPRESSION: No evidence of thoracentesis complication. No visible residual fluid. Electronically Signed   By: Jorje Guild M.D.   On: 09/07/2021 09:19   DG Chest 2 View  Result Date: 09/06/2021 CLINICAL DATA:  Shortness of breath EXAM: CHEST - 2 VIEW COMPARISON:  Chest radiograph 03/06/2018, CT  chest 09/04/2021 FINDINGS: The cardiomediastinal silhouette is within normal limits. There is a moderate size right pleural effusion with adjacent opacity in the right base likely reflecting atelectasis. The right upper lung is well-aerated. There is a smaller left pleural effusion. The left lung is otherwise clear. There is no pneumothorax. There is no acute osseous abnormality. IMPRESSION: Moderate right and small left pleural effusions with adjacent right basilar atelectasis. Electronically Signed   By: Valetta Mole  M.D.   On: 09/06/2021 13:52   CT CHEST W CONTRAST  Result Date: 09/04/2021 CLINICAL DATA:  Abdominal tightness for 1 month. Bulky abdominal adenopathy on recent CT. History of prostate cancer. Creatinine was obtained on site at [place] at [address] Results: Creatinine 1.39 mg/dL. EXAM: CT CHEST WITH CONTRAST TECHNIQUE: Multidetector CT imaging of the chest was performed during intravenous contrast administration. CONTRAST:  22mL ISOVUE-300 IOPAMIDOL (ISOVUE-300) INJECTION 61% COMPARISON:  Abdominopelvic CT 09/03/2021. Chest radiographs 03/06/2018. FINDINGS: Cardiovascular: Limited contrast opacification. Allowing for this, no acute vascular findings are identified. The heart size is normal. There is no pericardial effusion. Mediastinum/Nodes: There are no enlarged mediastinal, hilar or axillary lymph nodes. The thyroid gland, trachea and esophagus demonstrate no significant findings. Lungs/Pleura: Again demonstrated is moderate-sized dependent right pleural effusion, similar to recent abdominal CT. Trace pleural fluid on the left. Mild dependent atelectasis in both lungs. There is a subpleural part solid nodule in the superior segment of the left lower lobe, measuring 1.4 x 0.9 cm on image 68/8. No other pulmonary nodules are identified. Upper abdomen: Findings in the visualized upper abdomen are unchanged from yesterday CT. These include a heterogeneous soft tissue mass at  the splenic hilum measuring 9.5 x 7.7 cm on image 119/2, a subcapsular mass posteriorly in the right hepatic lobe, suspected bilateral renal masses and extensive confluent retroperitoneal and mesenteric lymphadenopathy. There is a small amount of ascites. Musculoskeletal/Chest wall: There is no chest wall mass or suspicious osseous finding. Scattered bone islands, most conspicuous within the right 5th and 7th ribs. IMPRESSION: 1. No thoracic adenopathy identified. 2. Unchanged moderate right and trace left pleural effusions. 3. Nonspecific part solid nodule in the superior segment of the left lower lobe, potentially inflammatory. No other suspicious pulmonary nodules. Attention on follow-up recommended. 4. Stable findings in the upper abdomen from yesterday's CT, remaining suspicious for lymphoma. Electronically Signed   By: Richardean Sale M.D.   On: 09/04/2021 10:29   CT ABDOMEN PELVIS W CONTRAST  Result Date: 09/03/2021 CLINICAL DATA:  Abdominal pain and swelling for 1 month. Weight loss. Personal history of prostate carcinoma. EXAM: CT ABDOMEN AND PELVIS WITH CONTRAST TECHNIQUE: Multidetector CT imaging of the abdomen and pelvis was performed using the standard protocol following bolus administration of intravenous contrast. CONTRAST:  187mL ISOVUE-370 IOPAMIDOL (ISOVUE-370) INJECTION 76% COMPARISON:  None. FINDINGS: Lower Chest: Moderate right pleural effusion and mild right lower lobe atelectasis. A tiny left pleural effusion is seen, and a pleural-based nodule is seen in the posterior left lower lobe which is incompletely visualized, but measures at least 12 mm. Hepatobiliary: A 6 x 3 cm low-attenuation lesion is seen in the posterior right hepatic lobe which shows evidence of peripheral nodular contrast enhancement a 2nd lesion is also seen in the anterior right hepatic lobe which measures 2.3 cm and shows signs of mild peripheral contrast enhancement. These lesions are not definitively characterized on  this exam, but may represent hemangiomas. Gallbladder is unremarkable. No evidence of biliary ductal dilatation. Pancreas:  No mass or inflammatory changes. Spleen: Within normal limits in size and appearance. Bulky soft tissue mass is seen in the left subdiaphragmatic region which abuts the spleen, measuring 8.6 x 8.1 cm on image 16/2. Adrenals/Urinary Tract: Adrenal glands are normal in appearance. Several ill-defined low-attenuation masses are seen in both kidneys which are difficult to measure due to their poorly defined margins. No evidence of hydronephrosis. Stomach/Bowel: Encasement of bowel loops by abnormal mesenteric soft tissue density is  seen, however there is no evidence of bowel obstruction. Vascular/Lymphatic: Bulky abdominal lymphadenopathy is seen throughout the retroperitoneum and small bowel mesentery, which encases the mesenteric vessels, hilum, pancreas, and aorta and IVC. Mild retrocrural lymphadenopathy also noted. Mild pelvic lymphadenopathy is seen in bilateral iliac lymph node chains and pelvic mesentery. Misty soft tissue density is seen throughout the mesenteric and omental fat, and mild ascites is also demonstrated. Reproductive: Brachytherapy seeds are seen throughout the prostate bed. Other: Small bilateral inguinal hernias are seen, both containing only fat. Musculoskeletal: No suspicious bone lesions identified. Nonaggressive appearing chondrous bone lesion is seen in the left ilium. IMPRESSION: Bulky abdominal and pelvic lymphadenopathy, ill-defined bilateral renal masses, diffuse mesenteric and omental misty opacity, and mild ascites. These findings are highly suspicious for lymphoma, with metastatic disease considered less likely. Two indeterminate liver lesions, which may represent hemangiomas. Consider abdomen MRI without and with contrast or PET-CT for further evaluation. Moderate right pleural effusion and tiny left pleural effusion, and left lower lobe pleural-based pulmonary  nodule measuring at least 12 mm. Lymphoma or metastatic disease cannot be excluded. These results will be called to the ordering clinician or representative by the Radiologist Assistant, and communication documented in the PACS or [employer]. Electronically Signed   By: Marlaine Hind M.D.   On: 09/03/2021 08:43   [place] ASCITES (ABDOMEN LIMITED)  Result Date: 09/07/2021 CLINICAL DATA:  History of prostate cancer, now with bulky retroperitoneal and pelvic lymphadenopathy worrisome for lymphoma versus metastatic disease. Please perform image guided biopsy for tissue diagnostic purposes. EXAM: LIMITED ABDOMEN ULTRASOUND FOR ASCITES TECHNIQUE: Limited ultrasound survey for ascites was performed in all four abdominal quadrants. COMPARISON:  CT abdomen and pelvis-09/03/2021 FINDINGS: Sonographic evaluation of the abdomen demonstrates a trace amount of intra-abdominal ascites, too small to allow for safe ultrasound-guided paracentesis. No paracentesis attempted. IMPRESSION: Trace amount of intra-abdominal ascites, too small to allow for safe ultrasound-guided paracentesis. No paracentesis attempted. Electronically Signed   By: Sandi Mariscal M.D.   On: 09/07/2021 10:46   VAS [place] LOWER EXTREMITY VENOUS (DVT)  Result Date: 09/07/2021  Lower Venous DVT Study Patient Name:  Kenneth Wiggins  Date of Exam:   09/07/2021 Medical Rec #: [id number]        Accession #:    [id number] Date of Birth: 1961-08-20         Patient Gender: M Patient Age:   46 years Exam Location:  [facility] Procedure:      VAS [place] LOWER EXTREMITY VENOUS (DVT) Referring Phys: Annamaria Boots XU --------------------------------------------------------------------------------  Indications: Edema, abdomen distention w/ ascites, history of prostate cancer.  Limitations: Body habitus and poor ultrasound/tissue interface. Comparison Study: No prior studies. Performing Technologist: Darlin Coco RDMS, RVT  Examination Guidelines: A complete evaluation includes  B-mode imaging, spectral Doppler, color Doppler, and power Doppler as needed of all accessible portions of each vessel. Bilateral testing is considered an integral part of a complete examination. Limited examinations for reoccurring indications may be performed as noted. The reflux portion of the exam is performed with the patient in reverse Trendelenburg.  +---------+---------------+---------+-----------+----------+--------------+ RIGHT    CompressibilityPhasicitySpontaneityPropertiesThrombus Aging +---------+---------------+---------+-----------+----------+--------------+ CFV      Full           Yes      Yes                                 +---------+---------------+---------+-----------+----------+--------------+ SFJ      Full                                                        +---------+---------------+---------+-----------+----------+--------------+  FV Prox  Full                                                        +---------+---------------+---------+-----------+----------+--------------+ FV Mid   Full                                                        +---------+---------------+---------+-----------+----------+--------------+ FV DistalFull                                                        +---------+---------------+---------+-----------+----------+--------------+ PFV      Full                                                        +---------+---------------+---------+-----------+----------+--------------+ POP      Full           Yes      Yes                                 +---------+---------------+---------+-----------+----------+--------------+ PTV      Full                                                        +---------+---------------+---------+-----------+----------+--------------+ PERO     Full                                                         +---------+---------------+---------+-----------+----------+--------------+ Gastroc  Full                                                        +---------+---------------+---------+-----------+----------+--------------+   +---------+---------------+---------+-----------+----------+-------------------+ LEFT     CompressibilityPhasicitySpontaneityPropertiesThrombus Aging      +---------+---------------+---------+-----------+----------+-------------------+ CFV      Full           Yes      Yes                                      +---------+---------------+---------+-----------+----------+-------------------+ SFJ      Full                                                             +---------+---------------+---------+-----------+----------+-------------------+  FV Prox  Full                                                             +---------+---------------+---------+-----------+----------+-------------------+ FV Mid   Full                                                             +---------+---------------+---------+-----------+----------+-------------------+ FV DistalFull                                                             +---------+---------------+---------+-----------+----------+-------------------+ PFV      Full                                                             +---------+---------------+---------+-----------+----------+-------------------+ POP      Full                                                             +---------+---------------+---------+-----------+----------+-------------------+ PTV      Full                                                             +---------+---------------+---------+-----------+----------+-------------------+ PERO     Full                                         Not well visualized +---------+---------------+---------+-----------+----------+-------------------+ Gastroc  Full                                                              +---------+---------------+---------+-----------+----------+-------------------+    Summary: RIGHT: - There is no evidence of deep vein thrombosis in the lower extremity.  - No cystic structure found in the popliteal fossa.  LEFT: - There is no evidence of deep vein thrombosis in the lower extremity. However, portions of this examination were limited- see technologist comments above.  - No cystic structure found in the popliteal fossa.  *See table(s) above for measurements and observations.    Preliminary    US THORACENTESIS ASP PLEURAL SPACE W/IMG GUIDE  Result Date: 09/07/2021 INDICATION: Shortness of breath. Right-sided pleural effusion. Request therapeutic and diagnostic thoracentesis. EXAM: ULTRASOUND GUIDED RIGHT THORACENTESIS MEDICATIONS: 1% plain lidocaine, 5 mL COMPLICATIONS: None immediate. PROCEDURE: An ultrasound guided thoracentesis was thoroughly discussed with the patient and questions answered. The benefits, risks, alternatives and complications were also discussed. The patient understands and wishes to proceed with the procedure. Written consent was obtained. Ultrasound was performed to localize and mark an adequate pocket of fluid in the right chest. The area was then prepped and draped in the normal sterile fashion. 1% Lidocaine was used for local anesthesia. Under ultrasound guidance a 6 Fr Safe-T-Centesis catheter was introduced. Thoracentesis was performed. The catheter was removed and a dressing applied. FINDINGS: A total of approximately 1.3 L of clear yellow fluid was removed. Samples were sent to the laboratory as requested by the clinical team. IMPRESSION: Successful ultrasound guided right thoracentesis yielding 1.3 L of pleural fluid. Read by: Ascencion Dike PA-C Electronically Signed   By: Sandi Mariscal M.D.   On: 09/07/2021 10:44    Labs:  CBC: Recent Labs    04/01/21 0757 09/06/21 1241 09/07/21 0351  09/08/21 0403  WBC 6.5 8.1 7.4 6.3  HGB 15.0 13.3 12.5* 11.9*  HCT 42.8 39.7 37.7* 36.6*  PLT 241 415* 406* 361    COAGS: No results for input(s): INR, APTT in the last 8760 hours.  BMP: Recent Labs    04/01/21 0757 09/06/21 1241 09/07/21 0351 09/08/21 0403  NA 137 140 135 137  K 3.1* 4.4 4.6 4.2  CL 97* 102 101 101  CO2 28 27 21* 24  GLUCOSE 139* 97 99 104*  BUN 17 34* 35* 45*  CALCIUM 9.5 10.7* 9.4 9.1  CREATININE 1.35* 2.61* 3.03* 3.12*  GFRNONAA >60 27* 23* 22*    LIVER FUNCTION TESTS: Recent Labs    09/06/21 1241 09/07/21 0351  BILITOT 0.8 0.9  AST 23 36  ALT 20 28  ALKPHOS 66 59  PROT 7.0 6.8  ALBUMIN 4.2 3.5    TUMOR MARKERS: No results for input(s): AFPTM, CEA, CA199, CHROMGRNA in the last 8760 hours.  Assessment and Plan: Lymphadenopathy Imaging reviewed, inguinal adenopathy amenable to US guided bx Labs reviewed. Risks and benefits of LN bx was discussed with the patient and/or patient's family including, but not limited to bleeding, infection, damage to adjacent structures or low yield requiring additional tests.  All of the questions were answered and there is agreement to proceed.  Consent signed and in chart.    Thank you for this interesting consult.  I greatly enjoyed meeting Kenneth Wiggins and look forward to participating in their care.  A copy of this report was sent to the requesting provider on this date.  Electronically Signed: Ascencion Dike, PA-C 09/08/2021, 10:23 AM   I spent a total of 20 minutes in face to face in clinical consultation, greater than 50% of which was counseling/coordinating care for LN bx

## 2021-09-08 NOTE — Progress Notes (Signed)
PROGRESS NOTE    Kenneth Wiggins  PYK:[id number] DOB: July 10, 1961 DOA: 09/06/2021 PCP: Deland Pretty, MD    Chief Complaint  Patient presents with   Shortness of Breath    Brief Narrative:   Kenneth Wiggins is a 60 y.o. male with medical history significant of asthma, prostate cancer, GERD, HTN, hiatal hernia.  Presented with complaints of worsening shortness of breath  Subjective:   denies pain, no fever Persistent sinus tachycardia, does not appear to have symptom ,denies chest pain, no hypoxia He is to be npo aftermidnight  for biopsy tomorrow  Assessment & Plan:   Principal Problem:   Ascites Active Problems:   Prostate cancer (HCC)   Sinus tachycardia   OSA (obstructive sleep apnea)   Obesity, Class III, BMI 40-49.9 (morbid obesity) (HCC)   Pleural effusion on right   Lymphadenopathy   AKI (acute kidney injury) ([place])   Ascites/pleural effusion on the right -Suspect malignancy in nature -Underwent right-sided thoracentesis, fluid study suggest exudative , gram stain negative , culture no growth so far  - per ultrasound not enough /not safe to tap ascites   Soft tissue mass/lymphadenopathy Suspect lymphoma, will require biopsy,  N.p.o. after midnight for biopsy on Monday , IR input appreciated    Lactic acidosis Resolved  AKI on CKDII Creatinine baseline appear around 1.3 Creatinine on admission 2.6, today 3.0 UA unremarkable CT abdomen did not reveal obstructive nephropathy He did receive CT with contrast, cannot rule out contrast nephropathy Encourage oral intake, may need hydration if continue to trend up Monitor BMP, renal dosing meds  Sinus tachycardia Likely reactive, denies chest pain, no hypoxia TSH unremarkable, with slightly elevated free T4  Bilateral lower extremity pitting edema Reports chronic, consider discontinue Norvasc Venous doppler negative for DVT He received 1 dose of Lasix on admission, creatinine elevated we will hold Lasix  for now Elevate legs  Class III obesity/OSA on CPAP : Body mass index is 45.54 kg/m.Marland Kitchen      Unresulted Labs (From admission, onward)     Start     Ordered   09/08/21 5397  Basic metabolic panel  Daily,   R      09/07/21 1825   09/07/21 0500  CBC with Differential/Platelet  Daily,   R      09/07/21 0029              DVT prophylaxis: heparin injection 5,000 Units Start: 09/06/21 2200   Code Status: Full Family Communication: Patient Disposition:   Status is: Inpatient  Dispo: The patient is from: Home              Anticipated d/c is to: Home              Anticipated d/c date is: hopefull can go home tomorrow after biopsy, if  renal function stable                Consultants:  IR  Procedures:  Right thoracentesis  Antimicrobials:   Anti-infectives (From admission, onward)    None           Objective: Vitals:   09/08/21 0400 09/08/21 0411 09/08/21 0943 09/08/21 1228  BP: 110/80  116/72 113/77  Pulse: (!) 105  (!) 110 (!) 109  Resp: 20  18 18   Temp: 98.2 F (36.8 C)   98.7 F (37.1 C)  TempSrc: Oral   Oral  SpO2: 99%  100% 99%  Weight:  (!) 148.1 kg    Height:  Intake/Output Summary (Last 24 hours) at 09/08/2021 1345 Last data filed at 09/08/2021 0853 Gross per 24 hour  Intake 360 ml  Output --  Net 360 ml   Filed Weights   09/06/21 1938 09/07/21 0401 09/08/21 0411  Weight: (!) 148.6 kg (!) 148.6 kg (!) 148.1 kg    Examination:  General exam: alert, awake, communicative,calm, NAD Respiratory system: improved aeration right lung. Respiratory effort normal. Cardiovascular system:   sinus tachycardia Gastrointestinal system: Abdomen is slightly distended, soft and nontender.  Normal bowel sounds heard. Central nervous system: Alert and oriented. No focal neurological deficits. Extremities:  bilateral lower extremity pitting edema Skin: No rashes, lesions or ulcers Psychiatry: Judgement and insight appear normal. Mood & affect  appropriate.     Data Reviewed: I have personally reviewed following labs and imaging studies  CBC: Recent Labs  Lab 09/06/21 1241 09/07/21 0351 09/08/21 0403  WBC 8.1 7.4 6.3  NEUTROABS  --  5.2 4.3  HGB 13.3 12.5* 11.9*  HCT 39.7 37.7* 36.6*  MCV 80.7 82.9 83.2  PLT 415* 406* 629    Basic Metabolic Panel: Recent Labs  Lab 09/06/21 1241 09/07/21 0351 09/08/21 0403  NA 140 135 137  K 4.4 4.6 4.2  CL 102 101 101  CO2 27 21* 24  GLUCOSE 97 99 104*  BUN 34* 35* 45*  CREATININE 2.61* 3.03* 3.12*  CALCIUM 10.7* 9.4 9.1    GFR: Estimated Creatinine Clearance: 37.2 mL/min (A) (by C-G formula based on SCr of 3.12 mg/dL (H)).  Liver Function Tests: Recent Labs  Lab 09/06/21 1241 09/07/21 0351  AST 23 36  ALT 20 28  ALKPHOS 66 59  BILITOT 0.8 0.9  PROT 7.0 6.8  ALBUMIN 4.2 3.5    CBG: No results for input(s): GLUCAP in the last 168 hours.   Recent Results (from the past 240 hour(s))  Resp Panel by RT-PCR (Flu A&B, Covid) Nasopharyngeal Swab     Status: None   Collection Time: 09/06/21  3:48 PM   Specimen: Nasopharyngeal Swab; Nasopharyngeal(NP) swabs in vial transport medium  Result Value Ref Range Status   SARS Coronavirus 2 by RT PCR NEGATIVE NEGATIVE Final    Comment: (NOTE) SARS-CoV-2 target nucleic acids are NOT DETECTED.  The SARS-CoV-2 RNA is generally detectable in upper respiratory specimens during the acute phase of infection. The lowest concentration of SARS-CoV-2 viral copies this assay can detect is 138 copies/mL. A negative result does not preclude SARS-Cov-2 infection and should not be used as the sole basis for treatment or other patient management decisions. A negative result may occur with  improper specimen collection/handling, submission of specimen other than nasopharyngeal swab, presence of viral mutation(s) within the areas targeted by this assay, and inadequate number of viral copies(<138 copies/mL). A negative result must be  combined with clinical observations, patient history, and epidemiological information. The expected result is Negative.  Fact Sheet for Patients:  EntrepreneurPulse.com.au  Fact Sheet for Healthcare Providers:  IncredibleEmployment.be  This test is no t yet approved or cleared by the [place] FDA and  has been authorized for detection and/or diagnosis of SARS-CoV-2 by FDA under an Emergency Use Authorization (EUA). This EUA will remain  in effect (meaning this test can be used) for the duration of the COVID-19 declaration under Section 564(b)(1) of the Act, 21 U.S.C.section 360bbb-3(b)(1), unless the authorization is terminated  or revoked sooner.       Influenza A by PCR NEGATIVE NEGATIVE Final   Influenza B by PCR  NEGATIVE NEGATIVE Final    Comment: (NOTE) The Xpert Xpress SARS-CoV-2/FLU/RSV plus assay is intended as an aid in the diagnosis of influenza from Nasopharyngeal swab specimens and should not be used as a sole basis for treatment. Nasal washings and aspirates are unacceptable for Xpert Xpress SARS-CoV-2/FLU/RSV testing.  Fact Sheet for Patients: EntrepreneurPulse.com.au  Fact Sheet for Healthcare Providers: IncredibleEmployment.be  This test is not yet approved or cleared by the [place] FDA and has been authorized for detection and/or diagnosis of SARS-CoV-2 by FDA under an Emergency Use Authorization (EUA). This EUA will remain in effect (meaning this test can be used) for the duration of the COVID-19 declaration under Section 564(b)(1) of the Act, 21 U.S.C. section 360bbb-3(b)(1), unless the authorization is terminated or revoked.  Performed at [facility], [address] [place], [place] [address]   Culture, body fluid w Gram Stain-bottle     Status: None (Preliminary result)   Collection Time: 09/07/21  9:25 AM   Specimen: Fluid  Result Value Ref  Range Status   Specimen Description FLUID  Final   Special Requests NONE  Final   Culture   Final    NO GROWTH < 24 HOURS Performed at [facility] [address] [place], [place] [address]    Report Status PENDING  Incomplete  Gram stain     Status: None   Collection Time: 09/07/21  9:25 AM   Specimen: Fluid  Result Value Ref Range Status   Specimen Description FLUID  Final   Special Requests NONE  Final   Gram Stain   Final    RARE WBC PRESENT, PREDOMINANTLY MONONUCLEAR NO ORGANISMS SEEN Performed at [facility], [place] [address] [place], [place] [address]    Report Status 09/07/2021 FINAL  Final         Radiology Studies: DG Chest 1 View  Result Date: 09/07/2021 CLINICAL DATA:  Right thoracentesis EXAM: CHEST  1 VIEW COMPARISON:  Yesterday FINDINGS: Normal heart size and mediastinal contours. No acute infiltrate or edema. No effusion or pneumothorax. No acute osseous findings. IMPRESSION: No evidence of thoracentesis complication. No visible residual fluid. Electronically Signed   By: Jorje Guild M.D.   On: 09/07/2021 09:19   DG Chest 2 View  Result Date: 09/06/2021 CLINICAL DATA:  Shortness of breath EXAM: CHEST - 2 VIEW COMPARISON:  Chest radiograph 03/06/2018, CT chest 09/04/2021 FINDINGS: The cardiomediastinal silhouette is within normal limits. There is a moderate size right pleural effusion with adjacent opacity in the right base likely reflecting atelectasis. The right upper lung is well-aerated. There is a smaller left pleural effusion. The left lung is otherwise clear. There is no pneumothorax. There is no acute osseous abnormality. IMPRESSION: Moderate right and small left pleural effusions with adjacent right basilar atelectasis. Electronically Signed   By: Valetta Mole M.D.   On: 09/06/2021 13:52   [place] ASCITES (ABDOMEN LIMITED)  Result Date: 09/07/2021 CLINICAL DATA:  History of prostate cancer, now with bulky retroperitoneal and pelvic  lymphadenopathy worrisome for lymphoma versus metastatic disease. Please perform image guided biopsy for tissue diagnostic purposes. EXAM: LIMITED ABDOMEN ULTRASOUND FOR ASCITES TECHNIQUE: Limited ultrasound survey for ascites was performed in all four abdominal quadrants. COMPARISON:  CT abdomen and pelvis-09/03/2021 FINDINGS: Sonographic evaluation of the abdomen demonstrates a trace amount of intra-abdominal ascites, too small to allow for safe ultrasound-guided paracentesis. No paracentesis attempted. IMPRESSION: Trace amount of intra-abdominal ascites, too small to allow for safe ultrasound-guided paracentesis. No paracentesis  attempted. Electronically Signed   By: Sandi Mariscal M.D.   On: 09/07/2021 10:46   VAS [place] LOWER EXTREMITY VENOUS (DVT)  Result Date: 09/07/2021  Lower Venous DVT Study Patient Name:  Kenneth Wiggins  Date of Exam:   09/07/2021 Medical Rec #: [id number]        Accession #:    [id number] Date of Birth: [birth year]         Patient Gender: M Patient Age:   52 years Exam Location:  [facility] Procedure:      VAS [place] LOWER EXTREMITY VENOUS (DVT) Referring Phys: Annamaria Boots Jaymeson Mengel --------------------------------------------------------------------------------  Indications: Edema, abdomen distention w/ ascites, history of prostate cancer.  Limitations: Body habitus and poor ultrasound/tissue interface. Comparison Study: No prior studies. Performing Technologist: Darlin Coco RDMS, RVT  Examination Guidelines: A complete evaluation includes B-mode imaging, spectral Doppler, color Doppler, and power Doppler as needed of all accessible portions of each vessel. Bilateral testing is considered an integral part of a complete examination. Limited examinations for reoccurring indications may be performed as noted. The reflux portion of the exam is performed with the patient in reverse Trendelenburg.  +---------+---------------+---------+-----------+----------+--------------+ RIGHT     CompressibilityPhasicitySpontaneityPropertiesThrombus Aging +---------+---------------+---------+-----------+----------+--------------+ CFV      Full           Yes      Yes                                 +---------+---------------+---------+-----------+----------+--------------+ SFJ      Full                                                        +---------+---------------+---------+-----------+----------+--------------+ FV Prox  Full                                                        +---------+---------------+---------+-----------+----------+--------------+ FV Mid   Full                                                        +---------+---------------+---------+-----------+----------+--------------+ FV DistalFull                                                        +---------+---------------+---------+-----------+----------+--------------+ PFV      Full                                                        +---------+---------------+---------+-----------+----------+--------------+ POP      Full           Yes      Yes                                 +---------+---------------+---------+-----------+----------+--------------+  PTV      Full                                                        +---------+---------------+---------+-----------+----------+--------------+ PERO     Full                                                        +---------+---------------+---------+-----------+----------+--------------+ Gastroc  Full                                                        +---------+---------------+---------+-----------+----------+--------------+   +---------+---------------+---------+-----------+----------+-------------------+ LEFT     CompressibilityPhasicitySpontaneityPropertiesThrombus Aging      +---------+---------------+---------+-----------+----------+-------------------+ CFV      Full           Yes      Yes                                       +---------+---------------+---------+-----------+----------+-------------------+ SFJ      Full                                                             +---------+---------------+---------+-----------+----------+-------------------+ FV Prox  Full                                                             +---------+---------------+---------+-----------+----------+-------------------+ FV Mid   Full                                                             +---------+---------------+---------+-----------+----------+-------------------+ FV DistalFull                                                             +---------+---------------+---------+-----------+----------+-------------------+ PFV      Full                                                             +---------+---------------+---------+-----------+----------+-------------------+ POP      Full                                                             +---------+---------------+---------+-----------+----------+-------------------+  PTV      Full                                                             +---------+---------------+---------+-----------+----------+-------------------+ PERO     Full                                         Not well visualized +---------+---------------+---------+-----------+----------+-------------------+ Gastroc  Full                                                             +---------+---------------+---------+-----------+----------+-------------------+    Summary: RIGHT: - There is no evidence of deep vein thrombosis in the lower extremity.  - No cystic structure found in the popliteal fossa.  LEFT: - There is no evidence of deep vein thrombosis in the lower extremity. However, portions of this examination were limited- see technologist comments above.  - No cystic structure found in the popliteal fossa.  *See  table(s) above for measurements and observations.    Preliminary    US THORACENTESIS ASP PLEURAL SPACE W/IMG GUIDE  Result Date: 09/07/2021 INDICATION: Shortness of breath. Right-sided pleural effusion. Request therapeutic and diagnostic thoracentesis. EXAM: ULTRASOUND GUIDED RIGHT THORACENTESIS MEDICATIONS: 1% plain lidocaine, 5 mL COMPLICATIONS: None immediate. PROCEDURE: An ultrasound guided thoracentesis was thoroughly discussed with the patient and questions answered. The benefits, risks, alternatives and complications were also discussed. The patient understands and wishes to proceed with the procedure. Written consent was obtained. Ultrasound was performed to localize and mark an adequate pocket of fluid in the right chest. The area was then prepped and draped in the normal sterile fashion. 1% Lidocaine was used for local anesthesia. Under ultrasound guidance a 6 Fr Safe-T-Centesis catheter was introduced. Thoracentesis was performed. The catheter was removed and a dressing applied. FINDINGS: A total of approximately 1.3 L of clear yellow fluid was removed. Samples were sent to the laboratory as requested by the clinical team. IMPRESSION: Successful ultrasound guided right thoracentesis yielding 1.3 L of pleural fluid. Read by: Ascencion Dike PA-C Electronically Signed   By: Sandi Mariscal M.D.   On: 09/07/2021 10:44        Scheduled Meds:  heparin  5,000 Units Subcutaneous Q8H   Continuous Infusions:   LOS: 2 days   Time spent: 26mins Greater than 50% of this time was spent in counseling, explanation of diagnosis, planning of further management, and coordination of care.   Voice Recognition Viviann Spare dictation system was used to create this note, attempts have been made to correct errors. Please contact the author with questions and/or clarifications.   Florencia Reasons, MD PhD FACP [facility]  Available via Epic secure chat 7am-7pm for nonurgent issues Please page for urgent issues To  page the attending provider between 7A-7P or the covering provider during after hours 7P-7A, please log into the web site [email or web address] and access using universal [place] password for that web site. If you do not have the password, please call the hospital operator.  09/08/2021, 1:45 PM

## 2021-09-09 ENCOUNTER — Other Ambulatory Visit (HOSPITAL_COMMUNITY): Payer: 59

## 2021-09-09 ENCOUNTER — Inpatient Hospital Stay (HOSPITAL_COMMUNITY): Payer: 59

## 2021-09-09 DIAGNOSIS — R188 Other ascites: Secondary | ICD-10-CM | POA: Diagnosis not present

## 2021-09-09 LAB — BASIC METABOLIC PANEL
Anion gap: 12 (ref 5–15)
BUN: 46 mg/dL — ABNORMAL HIGH (ref 6–20)
CO2: 24 mmol/L (ref 22–32)
Calcium: 9.1 mg/dL (ref 8.9–10.3)
Chloride: 102 mmol/L (ref 98–111)
Creatinine, Ser: 3.04 mg/dL — ABNORMAL HIGH (ref 0.61–1.24)
GFR, Estimated: 23 mL/min — ABNORMAL LOW (ref >60)
Glucose, Bld: 99 mg/dL (ref 70–99)
Potassium: 4.1 mmol/L (ref 3.5–5.1)
Sodium: 138 mmol/L (ref 135–145)

## 2021-09-09 NOTE — TOC Progression Note (Signed)
Transition of Care [facility]) - Progression Note    Patient Details  Name: Kenneth Wiggins MRN: [id number] Date of Birth: 1961-01-21  [facility] CM/SW Contact  Purcell Mouton, RN Phone Number: 09/09/2021, 1:28 PM  Clinical Narrative:     Pt from home alone and plan to return home. TOC will continue to follow for [facility] needs.   Expected Discharge Plan: Home/Self Care Barriers to Discharge: No Barriers Identified  Expected Discharge Plan and Services Expected Discharge Plan: Home/Self Care       Living arrangements for the past 2 months: Single Family Home                                       Social Determinants of Health (SDOH) Interventions    Readmission Risk Interventions No flowsheet data found.

## 2021-09-09 NOTE — Consult Note (Signed)
Renal Service Consult Note [facility]  Kenneth Wiggins 09/09/2021 Kenneth Blazing, MD Requesting Physician: Dr. Tawanna Solo  Reason for Consult: Renal failure HPI: The patient is a 60 y.o. year-old w/ hx of asthma, prostate cancer, GERD , HTN who presented w/ SOB and abd swelling for 1- 54month. PCP ordered an [place] pt referred to OP GI. He had CT abdomen which showed lymphadenopathy and ascites. An OP biopsy was set up for , then the pt's symptoms worsened and he came to ED.  In ED BUn 34, Cr 2.61. BP's soft 100- 1742range systolic. HR 100. Pt admitted 11/25 for suspected malignancy w/ AKI, new ascites. Pt underwent R thoracentesis yest w/ improved SOB. Is NPO today for LN biopsy. Asked to see for renal failure.    Creat 2.6 on admission and is up to 3.0 yesterday and 3.0 today. No recorded UOP yet, although pt says he is having no trouble voiding.   Pt states abd swelling for about 2 mos now. SOB 1-2 wks. Leg swelling for "years". No hx CHF , liver /kidney disease. No ETOH/ tob abuse. Pt's wife died last summer, he had to go on BP pills shortly therafter. Lives w/ his mother-in-law now. His aunt is with him today in the room. Denies any sig OTC medication use. No hx nsaids.       ROS - denies CP, no joint pain, no HA, no blurry vision, no rash, no diarrhea, no nausea/ vomiting, no dysuria, no difficulty voiding   Past Medical History  Past Medical History:  Diagnosis Date   Ascites    Asthma as child   hx of   Cancer ([facility]    prostate - psa 3.93 on 07/06/2012   GERD (gastroesophageal reflux disease)    hx of   H/O hiatal hernia 15 years ago   Hypertension    [place]   Gleason 3+3=6, vol 20-25 gm   [place]   Seed implant to be done due to Robotic surgery aborted   Sleep apnea    CPAP   Past Surgical History  Past Surgical History:  Procedure Laterality Date   CARPAL TUNNEL RELEASE Right 04/03/2021    Procedure: RIGHT CARPAL TUNNEL RELEASE;  Surgeon: XLeandrew Koyanagi MD;  [facility]  Service: Orthopedics;  Laterality: Right;   HERNIA REPAIR  as child   KNEE SURGERY Left as child   tore a ligament    MASS EXCISION Right 03/07/2021   Procedure: EXCISION MASS OF RIGHT CHEST;  Surgeon: MJohnathan Hausen MD;  Location: [facility];  Service: General;  Laterality: Right;   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   RADIOACTIVE SEED IMPLANT N/A 01/17/2013   Procedure: RADIOACTIVE SEED IMPLANT;  Surgeon: DBernestine Amass MD;  Location: [facility];  Service: Urology;  Laterality: N/A;  W/ MURRAY    ROBOT ASSISTED LAPAROSCOPIC RADICAL PROSTATECTOMY N/A 11/24/2012   Procedure: Attempted ROBOTIC ASSISTED LAPAROSCOPIC RADICAL PROSTATECTOMY, Abandoned;  Surgeon: DBernestine Amass MD;  Location: [facility];  Service: Urology;  Laterality: N/A;   scrotum explotion Left    SHOULDER ARTHROSCOPY WITH ROTATOR CUFF REPAIR AND SUBACROMIAL DECOMPRESSION Right 04/03/2021   Procedure: RIGHT SHOULDER ARTHROSCOPY ROTATOR CUFF REPAIR, SUBACROMIAL DECOMPRESSION, EXTENSIVE DEBRIDEMENT;  Surgeon: XLeandrew Koyanagi MD;  [place]  Service: Orthopedics;  Laterality: Right;   testical growth removed     VASECTOMY     Family History  Family History  Problem Relation  Age of Onset   Cancer Maternal Aunt        colon   Cancer Paternal Uncle        prostate, 2 uncles   Cancer Daughter        angiosarcoma age 59   Prostate cancer Other    Social History  reports that he has never smoked. He has never used smokeless tobacco. He reports that he does not drink alcohol and does not use drugs. Allergies No Known Allergies Home medications Prior to Admission medications   Medication Sig Start Date End Date Taking? Authorizing Provider  amLODipine (NORVASC) 10 MG tablet Take 10 mg by mouth daily.   Yes [provider]  chlorthalidone (HYGROTON) 25 MG tablet Take 25 mg by mouth daily.   Yes [provider]  Multiple  Vitamins-Minerals (MULTIVITAMIN WITH MINERALS) tablet Take 1 tablet by mouth daily.   Yes [provider]  olmesartan (BENICAR) 40 MG tablet Take 40 mg by mouth daily.   Yes [provider]  oxyCODONE-acetaminophen (PERCOCET) 5-325 MG tablet Take 1-2 tablets by mouth every 8 (eight) hours as needed for severe pain. Patient not taking: Reported on 09/06/2021 04/03/21   Leandrew Koyanagi, MD     Vitals:   09/08/21 2027 09/09/21 0545 09/09/21 0619 09/09/21 1218  BP: 113/76  114/78 106/67  Pulse: (!) 110  (!) 104 (!) 102  Resp: _0 Temp: 99 F (37.2 C)  98.8 F (37.1 C) 98.2 F (36.8 C)  TempSrc: Oral  Oral   SpO2: 99%  98% 99%  Weight:  (!) 147.4 kg    Height:       Exam Gen alert, no distress No rash, cyanosis or gangrene Sclera anicteric, throat clear  No jvd or bruits, flat neck veins Chest clear bilat to bases, no rales/ wheezing RRR no MRG Abd markedly protuberant abdomen, no fluid wave, nontender GU normal male MS no joint effusions or deformity Ext 1-2+ bilat pretib edema, no hip or UE edema, no wounds or ulcers Neuro is alert, Ox 3 , nf   Home meds include - norvasc 10, hygroton 25, benicar 40 qd, percocet prn, MVI     CT abdomen +contrast 11/22 (done as outpatient)- Spleen: there is a bulky soft tissue mass is seen in the left subdiaphragmatic region which abuts the spleen, measuring 8.6 x 8.1 cm on image 16/2.  Adrenals/Urinary Tract:  Several ill-defined low-attenuation masses are seen in both kidneys which are difficult to measure due to their poorly defined margins. No evidence of hydronephrosis. Stomach/Bowel: Encasement of bowel loops by abnormal mesenteric soft tissue density is seen, however there is no evidence of bowel obstruction. Vascular/Lymphatic: Bulky abdominal lymphadenopathy is seen throughout the retroperitoneum and small bowel mesentery, which encases the mesenteric vessels, hilum, pancreas, and aorta and IVC. Mild retrocrural  lymphadenopathy also noted. Mild pelvic lymphadenopathy is seen in bilateral iliac lymph node chains and pelvic mesentery. Misty soft tissue density is seen throughout the mesenteric and omental fat, and mild ascites is also demonstrated    UA 11/25 - negative    UNa, UCr pending    CXR 11/26 post thora - FINDINGS: Normal heart size and mediastinal contours. No acute infiltrate or edema. No effusion or pneumothorax. No acute osseous findings.    Na 138  K 4.1  CO2 24  BUN 46  Cr 3.04  cA 9.1  alb 3.5  Tport 6.8  Tbili 0.9  AST / ALT wnl  eGFR 22   wBC 7K  Hb 12.5              Date  Creat  eGFR     2014  0.9- 1.1     2018  1.23      2019  1.23 Feb 2021  1.30  > 60 ml/min     June 2022   1.35  > 60 ml/min     Nov 25  2.61  27    Nov 26  3.03  23     Nov 27  3.12  22     Sep 09, 2021 3.04  23   Assessment/ Plan: AKI - b/l creat from may-June 2022 was 1.30, eGFR > 60 ml/min. Pt here w/ recently onset abd swelling w/ CT scan (done w/ contrast 3 days prior to admission) showed bulky intra-abdominal LAN and going today for LN biopsy, suspected lymphoma.  LFT"s look ok, no hx liver failure. BP's are soft 100- 110 range. By CT the abdominal distension is more due to adipose and soft tissue masses than due to ascites (small amt ascites peri-hepatic). Doubt HRS w/o more evidence of liver damage and w/o gross ascites. Suspect ATN due to OP IV contrast given w/ CT scan, also taking ARB. Can't r/o some element of hypotension / hypovolemia as well.  Home BP's meds on hold here appropriately.  LE edema present, also R pleural effusion drained w/ results pending. Soft BP"s here. No pulm edema.  Recommend urine lytes and supportive care.  I/o, daily wt's. Creat down slightly today may be recovering already.  Will follow.  Abd distension - diffuse bulky LAN, for bx today HTN - home meds on hold, BP's 100- 110 range here. Avoid ARB/ acei.        Kelly Splinter  MD 09/09/2021, 1:17 PM  Recent Labs  Lab  09/07/21 0351 09/08/21 0403  WBC 7.4 6.3  HGB 12.5* 11.9*   Recent Labs  Lab 09/08/21 0403 09/09/21 0410  K 4.2 4.1  BUN 45* 46*  CREATININE 3.12* 3.04*  CALCIUM 9.1 9.1

## 2021-09-09 NOTE — Progress Notes (Signed)
PROGRESS NOTE    MARKOS THEIL  AST:[id number] DOB: [birth year] DOA: 09/06/2021 PCP: Deland Pretty, MD   Chief Complain:Sob  Brief Narrative: Patient is a 60 y.o. male with medical history significant of asthma, prostate cancer, GERD, HTN, hiatal hernia.  Presented with complaints of worsening shortness of breath.  He was found to have ascites/right pleural effusion.  Also found to have soft tissue mass/abdominal lymphadenopathy.  Suspicion for lymphoma.  Plan for left inguinal lymph node biopsy by IR today.  Hospital course was remarkable for persistent AKI.  Nephrology consulted  Assessment & Plan:   Principal Problem:   Ascites Active Problems:   Prostate cancer ([place])   Sinus tachycardia   OSA (obstructive sleep apnea)   Obesity, Class III, BMI 40-49.9 (morbid obesity) (HCC)   Pleural effusion on right   Lymphadenopathy   AKI (acute kidney injury) ([place])   Ascites/pleural effusion on the right -Suspect malignancy in nature -Underwent right-sided thoracentesis, fluid study suggest exudative , gram stain negative , culture no growth so far  - per ultrasound not enough /not safe to tap ascites -Abdomen remains distended   Soft tissue mass/lymphadenopathy Suspect lymphoma, will require biopsy,  N.p.o. after midnight for biopsy on Monday , IR input appreciated    Lactic acidosis Resolved   AKI on CKDII Creatinine baseline appear around 1.3 Denies history of any kidney disease Creatinine on admission 2.6, today still in the range of 3.0 UA unremarkable CT abdomen did not reveal obstructive nephropathy He did receive CT with contrast, cannot rule out contrast nephropathy Leg swelling reported.  Elevated intra abdominal pressure could also be playing a role here. Will check urine sodium, urine creatinine, ultrasound of the kidneys  Nephrology consulted  sinus tachycardia Likely reactive, denies chest pain, no hypoxia TSH unremarkable, with slightly elevated free T4    Bilateral lower extremity pitting edema Reports chronic, consider discontinue Norvasc Venous doppler negative for DVT He received 1 dose of Lasix on admission, creatinine elevated we will hold Lasix for now Elevate legs   Class III obesity/OSA on CPAP : Body mass index is 45.54 kg/m.Marland Kitchen         DVT prophylaxis:Heparin [place] Code Status: Full Family Communication: Family at bedside Patient status:  Dispo: The patient is from: Home               Anticipated d/c is to: Home               Anticipated d/c date is: In 1-2 days after improvement in kidney function,LN biopsy  Consultants: IR  Procedures:  Antimicrobials:  Anti-infectives (From admission, onward)    None       Subjective:  Patient seen and examined at the bedside this morning.  Hemodynamically stable.  On room air.  Denies any complaints today.  No shortness of breath or abdominal pain.  He looks volume overloaded with abdominal distention.  Waiting for LN biopsy   Objective: Vitals:   09/08/21 1228 09/08/21 2027 09/09/21 0545 09/09/21 0619  BP: 113/77 113/76  114/78  Pulse: (!) 109 (!) 110  (!) 104  Resp: 18 20  20   Temp: 98.7 F (37.1 C) 99 F (37.2 C)  98.8 F (37.1 C)  TempSrc: Oral Oral  Oral  SpO2: 99% 99%  98%  Weight:   (!) 147.4 kg   Height:       No intake or output data in the 24 hours ending 09/09/21 1134 Filed Weights   09/07/21 0401 09/08/21 0411  09/09/21 0545  Weight: (!) 148.6 kg (!) 148.1 kg (!) 147.4 kg    Examination:  General exam: Overall comfortable, not in distress, morbidly obese HEENT: PERRL Respiratory system:  no wheezes or crackles  Cardiovascular system: S1 & S2 heard, RRR.  Gastrointestinal system: Abdomen is distended, soft and nontender. Central nervous system: Alert and oriented Extremities: Bilateral lower extremity edema, no clubbing ,no cyanosis Skin: No rashes, no ulcers,no icterus      Data Reviewed: I have personally reviewed following labs and  imaging studies  CBC: Recent Labs  Lab 09/06/21 1241 09/07/21 0351 09/08/21 0403  WBC 8.1 7.4 6.3  NEUTROABS  --  5.2 4.3  HGB 13.3 12.5* 11.9*  HCT 39.7 37.7* 36.6*  MCV 80.7 82.9 83.2  PLT 415* 406* 856   Basic Metabolic Panel: Recent Labs  Lab 09/06/21 1241 09/07/21 0351 09/08/21 0403 09/09/21 0410  NA 140 135 137 138  K 4.4 4.6 4.2 4.1  CL 102 101 101 102  CO2 27 21* 24 24  GLUCOSE 97 99 104* 99  BUN 34* 35* 45* 46*  CREATININE 2.61* 3.03* 3.12* 3.04*  CALCIUM 10.7* 9.4 9.1 9.1   GFR: Estimated Creatinine Clearance: 38 mL/min (A) (by C-G formula based on SCr of 3.04 mg/dL (H)). Liver Function Tests: Recent Labs  Lab 09/06/21 1241 09/07/21 0351  AST 23 36  ALT 20 28  ALKPHOS 66 59  BILITOT 0.8 0.9  PROT 7.0 6.8  ALBUMIN 4.2 3.5   Recent Labs  Lab 09/06/21 1241  LIPASE 41   No results for input(s): AMMONIA in the last 168 hours. Coagulation Profile: No results for input(s): INR, PROTIME in the last 168 hours. Cardiac Enzymes: No results for input(s): CKTOTAL, CKMB, CKMBINDEX, TROPONINI in the last 168 hours. BNP (last 3 results) No results for input(s): PROBNP in the last 8760 hours. HbA1C: No results for input(s): HGBA1C in the last 72 hours. CBG: No results for input(s): GLUCAP in the last 168 hours. Lipid Profile: No results for input(s): CHOL, HDL, LDLCALC, TRIG, CHOLHDL, LDLDIRECT in the last 72 hours. Thyroid Function Tests: Recent Labs    09/08/21 0403  TSH 2.959  FREET4 1.31*   Anemia Panel: No results for input(s): VITAMINB12, FOLATE, FERRITIN, TIBC, IRON, RETICCTPCT in the last 72 hours. Sepsis Labs: Recent Labs  Lab 09/06/21 1244 09/06/21 1448  LATICACIDVEN 2.1* 1.8    Recent Results (from the past 240 hour(s))  Resp Panel by RT-PCR (Flu A&B, Covid) Nasopharyngeal Swab     Status: None   Collection Time: 09/06/21  3:48 PM   Specimen: Nasopharyngeal Swab; Nasopharyngeal(NP) swabs in vial transport medium  Result Value Ref  Range Status   SARS Coronavirus 2 by RT PCR NEGATIVE NEGATIVE Final    Comment: (NOTE) SARS-CoV-2 target nucleic acids are NOT DETECTED.  The SARS-CoV-2 RNA is generally detectable in upper respiratory specimens during the acute phase of infection. The lowest concentration of SARS-CoV-2 viral copies this assay can detect is 138 copies/mL. A negative result does not preclude SARS-Cov-2 infection and should not be used as the sole basis for treatment or other patient management decisions. A negative result may occur with  improper specimen collection/handling, submission of specimen other than nasopharyngeal swab, presence of viral mutation(s) within the areas targeted by this assay, and inadequate number of viral copies(<138 copies/mL). A negative result must be combined with clinical observations, patient history, and epidemiological information. The expected result is Negative.  Fact Sheet for Patients:  EntrepreneurPulse.com.au  Fact  Sheet for Healthcare Providers:  IncredibleEmployment.be  This test is no t yet approved or cleared by the [place] FDA and  has been authorized for detection and/or diagnosis of SARS-CoV-2 by FDA under an Emergency Use Authorization (EUA). This EUA will remain  in effect (meaning this test can be used) for the duration of the COVID-19 declaration under Section 564(b)(1) of the Act, 21 U.S.C.section 360bbb-3(b)(1), unless the authorization is terminated  or revoked sooner.       Influenza A by PCR NEGATIVE NEGATIVE Final   Influenza B by PCR NEGATIVE NEGATIVE Final    Comment: (NOTE) The Xpert Xpress SARS-CoV-2/FLU/RSV plus assay is intended as an aid in the diagnosis of influenza from Nasopharyngeal swab specimens and should not be used as a sole basis for treatment. Nasal washings and aspirates are unacceptable for Xpert Xpress SARS-CoV-2/FLU/RSV testing.  Fact Sheet for  Patients: EntrepreneurPulse.com.au  Fact Sheet for Healthcare Providers: IncredibleEmployment.be  This test is not yet approved or cleared by the [place] FDA and has been authorized for detection and/or diagnosis of SARS-CoV-2 by FDA under an Emergency Use Authorization (EUA). This EUA will remain in effect (meaning this test can be used) for the duration of the COVID-19 declaration under Section 564(b)(1) of the Act, 21 U.S.C. section 360bbb-3(b)(1), unless the authorization is terminated or revoked.  Performed at [facility], [address] [place], [place] [address]   Culture, body fluid w Gram Stain-bottle     Status: None (Preliminary result)   Collection Time: 09/07/21  9:25 AM   Specimen: Fluid  Result Value Ref Range Status   Specimen Description FLUID  Final   Special Requests NONE  Final   Culture   Final    NO GROWTH 2 DAYS Performed at [facility], [place] [address], [place], [place] [address]    Report Status PENDING  Incomplete  Gram stain     Status: None   Collection Time: 09/07/21  9:25 AM   Specimen: Fluid  Result Value Ref Range Status   Specimen Description FLUID  Final   Special Requests NONE  Final   Gram Stain   Final    RARE WBC PRESENT, PREDOMINANTLY MONONUCLEAR NO ORGANISMS SEEN Performed at [facility], [place] [address], [place], [place] [address]    Report Status 09/07/2021 FINAL  Final         Radiology Studies: VAS [place] LOWER EXTREMITY VENOUS (DVT)  Result Date: 09/09/2021  Lower Venous DVT Study Patient Name:  VYOM BRASS  Date of Exam:   09/07/2021 Medical Rec #: [id number]        Accession #:    [id number] Date of Birth: May 23, 1961         Patient Gender: M Patient Age:   34 years Exam Location:  [facility] Procedure:      VAS [place] LOWER EXTREMITY VENOUS (DVT) Referring Phys: Annamaria Boots XU  --------------------------------------------------------------------------------  Indications: Edema, abdomen distention w/ ascites, history of prostate cancer.  Limitations: Body habitus and poor ultrasound/tissue interface. Comparison Study: No prior studies. Performing Technologist: Darlin Coco RDMS, RVT  Examination Guidelines: A complete evaluation includes B-mode imaging, spectral Doppler, color Doppler, and power Doppler as needed of all accessible portions of each vessel. Bilateral testing is considered an integral part of a complete examination. Limited examinations for reoccurring indications may be performed as noted. The reflux portion of the exam is performed with the patient in reverse Trendelenburg.  +---------+---------------+---------+-----------+----------+--------------+ RIGHT  CompressibilityPhasicitySpontaneityPropertiesThrombus Aging +---------+---------------+---------+-----------+----------+--------------+ CFV      Full           Yes      Yes                                 +---------+---------------+---------+-----------+----------+--------------+ SFJ      Full                                                        +---------+---------------+---------+-----------+----------+--------------+ FV Prox  Full                                                        +---------+---------------+---------+-----------+----------+--------------+ FV Mid   Full                                                        +---------+---------------+---------+-----------+----------+--------------+ FV DistalFull                                                        +---------+---------------+---------+-----------+----------+--------------+ PFV      Full                                                        +---------+---------------+---------+-----------+----------+--------------+ POP      Full           Yes      Yes                                  +---------+---------------+---------+-----------+----------+--------------+ PTV      Full                                                        +---------+---------------+---------+-----------+----------+--------------+ PERO     Full                                                        +---------+---------------+---------+-----------+----------+--------------+ Gastroc  Full                                                        +---------+---------------+---------+-----------+----------+--------------+   +---------+---------------+---------+-----------+----------+-------------------+  LEFT     CompressibilityPhasicitySpontaneityPropertiesThrombus Aging      +---------+---------------+---------+-----------+----------+-------------------+ CFV      Full           Yes      Yes                                      +---------+---------------+---------+-----------+----------+-------------------+ SFJ      Full                                                             +---------+---------------+---------+-----------+----------+-------------------+ FV Prox  Full                                                             +---------+---------------+---------+-----------+----------+-------------------+ FV Mid   Full                                                             +---------+---------------+---------+-----------+----------+-------------------+ FV DistalFull                                                             +---------+---------------+---------+-----------+----------+-------------------+ PFV      Full                                                             +---------+---------------+---------+-----------+----------+-------------------+ POP      Full                                                             +---------+---------------+---------+-----------+----------+-------------------+ PTV      Full                                                              +---------+---------------+---------+-----------+----------+-------------------+ PERO     Full                                         Not well visualized +---------+---------------+---------+-----------+----------+-------------------+ Gastroc  Full                                                             +---------+---------------+---------+-----------+----------+-------------------+  Summary: RIGHT: - There is no evidence of deep vein thrombosis in the lower extremity.  - No cystic structure found in the popliteal fossa.  LEFT: - There is no evidence of deep vein thrombosis in the lower extremity. However, portions of this examination were limited- see technologist comments above.  - No cystic structure found in the popliteal fossa.  *See table(s) above for measurements and observations. Electronically signed by Orlie Pollen on 09/09/2021 at 10:17:25 AM.    Final         Scheduled Meds:  heparin  5,000 Units Subcutaneous Q8H   Continuous Infusions:   LOS: 3 days    Time spent: 35 mins.More than 50% of that time was spent in counseling and/or coordination of care.      Shelly Coss, MD [facility] P11/28/2022, 11:34 AM

## 2021-09-10 ENCOUNTER — Other Ambulatory Visit: Payer: Self-pay

## 2021-09-10 ENCOUNTER — Inpatient Hospital Stay (HOSPITAL_COMMUNITY): Payer: 59

## 2021-09-10 DIAGNOSIS — R188 Other ascites: Secondary | ICD-10-CM | POA: Diagnosis not present

## 2021-09-10 LAB — BASIC METABOLIC PANEL
Anion gap: 10 (ref 5–15)
BUN: 43 mg/dL — ABNORMAL HIGH (ref 6–20)
CO2: 23 mmol/L (ref 22–32)
Calcium: 8.9 mg/dL (ref 8.9–10.3)
Chloride: 103 mmol/L (ref 98–111)
Creatinine, Ser: 2.64 mg/dL — ABNORMAL HIGH (ref 0.61–1.24)
GFR, Estimated: 27 mL/min — ABNORMAL LOW (ref >60)
Glucose, Bld: 103 mg/dL — ABNORMAL HIGH (ref 70–99)
Potassium: 4 mmol/L (ref 3.5–5.1)
Sodium: 136 mmol/L (ref 135–145)

## 2021-09-10 LAB — SODIUM, URINE, RANDOM: Sodium, Ur: 25 mmol/L

## 2021-09-10 LAB — CREATININE, URINE, RANDOM: Creatinine, Urine: 181.27 mg/dL

## 2021-09-10 MED ORDER — FENTANYL CITRATE (PF) 100 MCG/2ML IJ SOLN
INTRAMUSCULAR | Status: AC | PRN
  Administered 2021-09-10: 50 ug via INTRAVENOUS

## 2021-09-10 MED ORDER — MIDAZOLAM HCL 2 MG/2ML IJ SOLN
INTRAMUSCULAR | Status: AC | PRN
  Administered 2021-09-10: 1 mg via INTRAVENOUS

## 2021-09-10 MED ORDER — LIDOCAINE HCL 1 % IJ SOLN
INTRAMUSCULAR | Status: AC
  Filled 2021-09-10: qty 20

## 2021-09-10 MED ORDER — FENTANYL CITRATE (PF) 100 MCG/2ML IJ SOLN
INTRAMUSCULAR | Status: AC
  Filled 2021-09-10: qty 2

## 2021-09-10 MED ORDER — MIDAZOLAM HCL 2 MG/2ML IJ SOLN
INTRAMUSCULAR | Status: AC
  Filled 2021-09-10: qty 2

## 2021-09-10 MED ORDER — LIDOCAINE HCL (PF) 1 % IJ SOLN
INTRAMUSCULAR | Status: AC | PRN
  Administered 2021-09-10: 10 mL via INTRADERMAL

## 2021-09-10 MED ORDER — ORAL CARE MOUTH RINSE: 15.0000 mL | Freq: Two times a day (BID) | OROMUCOSAL | Status: DC

## 2021-09-10 MED ORDER — CHLORHEXIDINE GLUCONATE 0.12 % MT SOLN: 15.0000 mL | Freq: Two times a day (BID) | OROMUCOSAL | Status: DC

## 2021-09-10 NOTE — Discharge Summary (Signed)
Physician Discharge Summary  ANES RIGEL NUU:[id number] DOB: 1961/05/08 DOA: 09/06/2021  PCP: Deland Pretty, MD  Admit date: 09/06/2021 Discharge date: 09/10/2021  Admitted From: Home Disposition:  Home  Discharge Condition:Stable CODE STATUS:FULL Diet recommendation: Heart Healthy    Brief/Interim Summary: Patient is a 60 y.o. male with medical history significant of asthma, prostate cancer, GERD, HTN, hiatal hernia.  Presented with complaints of worsening shortness of breath.  He was found to have ascites/right pleural effusion.  Also found to have soft tissue mass/abdominal lymphadenopathy.  High suspicion for lymphoma. Underwent  left inguinal lymph node biopsy by IR today.  Hospital course was remarkable for persistent AKI.  Nephrology consulted.  His creatinine has improved.  He will follow-up with nephrology as an outpatient, outpatient follow-up scheduled.  He will also be called oncology for the appointment and at that time the biopsy reports will be reviewed.  Following problems were addressed during his hospitalization:   Ascites/pleural effusion on the right -Suspect malignancy in nature -Underwent right-sided thoracentesis, fluid study suggest exudative , gram stain negative , culture no growth so far ,cytology has not resulted - per ultrasound not enough /not safe to tap ascites -No abd pain   Soft tissue mass/lymphadenopathy  CT imaging showed heterogeneous soft tissue mass at the splenic hilum measuring 9.5 x 7.7 cm , a subcapsular mass posteriorly in the right hepatic lobe, suspected bilateral renal masses and extensive confluent retroperitoneal and mesenteric lymphadenopathy.  High suspicion for lymphoma.  He is undergoing left inguinal lymph node biopsy today and will be discharged to home    AKI on CKDII Creatinine baseline appear around 1.3 Denies history of any H/O  kidney disease Creatinine on admission 2.6, creeped up to the range of 3 but now trending down  again. UA unremarkable CT abdomen did not reveal obstructive nephropathy He did receive CT with contrast, cannot rule out contrast nephropathy.  His blood pressure also remained soft most of the time during this hospitalization Nephrology arranged outpatient follow-up He needs to follow-up with his PCP and do a BMP just to check his kidney function.  sinus tachycardia Denies chest pain, no hypoxia TSH unremarkable, with slightly elevated free T4   Bilateral lower extremity pitting edema Reports chronic Venous doppler negative for DVT   Class III obesity/OSA on CPAP Body mass index is 45.54 kg/m.Marland Kitchen    Discharge Diagnoses:  Principal Problem:   Ascites Active Problems:   Prostate cancer ([place])   Sinus tachycardia   OSA (obstructive sleep apnea)   Obesity, Class III, BMI 40-49.9 (morbid obesity) (HCC)   Pleural effusion on right   Lymphadenopathy   AKI (acute kidney injury) [facility])    Discharge Instructions  Discharge Instructions     Ambulatory referral to Hematology / Oncology   Complete by: As directed    Diet - low sodium heart healthy   Complete by: As directed    Discharge instructions   Complete by: As directed    1)Please follow up with nephrology on 09/30/21 at 11 AM 2)You will be called by oncology as an outpatient for follow-up appointment/the lymph node biopsy result will be reviewed at the time of appointment 3)Follow up with your PCP in a week.Do a BMP test to check your kidney function   Increase activity slowly   Complete by: As directed       Allergies as of 09/10/2021   No Known Allergies      Medication List     STOP taking these  medications    amLODipine 10 MG tablet Commonly known as: NORVASC   chlorthalidone 25 MG tablet Commonly known as: HYGROTON   olmesartan 40 MG tablet Commonly known as: BENICAR   oxyCODONE-acetaminophen 5-325 MG tablet Commonly known as: Percocet       TAKE these medications    multivitamin with  minerals tablet Take 1 tablet by mouth daily.        Follow-up Information     Claudia Desanctis, MD Follow up on 09/30/2021.   Specialty: Nephrology Why: you have been scheduled for kidney doctor follow up w/ Dr Royce Macadamia at [facility], on Dec 19th at 11 am Contact information: [place] [address] [phone]                No Known Allergies  Consultations: Nephrology   Procedures/Studies: DG Chest 1 View  Result Date: 09/07/2021 CLINICAL DATA:  Right thoracentesis EXAM: CHEST  1 VIEW COMPARISON:  Yesterday FINDINGS: Normal heart size and mediastinal contours. No acute infiltrate or edema. No effusion or pneumothorax. No acute osseous findings. IMPRESSION: No evidence of thoracentesis complication. No visible residual fluid. Electronically Signed   By: Jorje Guild M.D.   On: 09/07/2021 09:19   DG Chest 2 View  Result Date: 09/06/2021 CLINICAL DATA:  Shortness of breath EXAM: CHEST - 2 VIEW COMPARISON:  Chest radiograph 03/06/2018, CT chest 09/04/2021 FINDINGS: The cardiomediastinal silhouette is within normal limits. There is a moderate size right pleural effusion with adjacent opacity in the right base likely reflecting atelectasis. The right upper lung is well-aerated. There is a smaller left pleural effusion. The left lung is otherwise clear. There is no pneumothorax. There is no acute osseous abnormality. IMPRESSION: Moderate right and small left pleural effusions with adjacent right basilar atelectasis. Electronically Signed   By: Valetta Mole M.D.   On: 09/06/2021 13:52   CT CHEST W CONTRAST  Result Date: 09/04/2021 CLINICAL DATA:  Abdominal tightness for 1 month. Bulky abdominal adenopathy on recent CT. History of prostate cancer. Creatinine was obtained on site at [facility] at [address] Results: Creatinine 1.39 mg/dL. EXAM: CT CHEST WITH CONTRAST TECHNIQUE: Multidetector CT imaging of the chest was performed during  intravenous contrast administration. CONTRAST:  45mL ISOVUE-300 IOPAMIDOL (ISOVUE-300) INJECTION 61% COMPARISON:  Abdominopelvic CT 09/03/2021. Chest radiographs 03/06/2018. FINDINGS: Cardiovascular: Limited contrast opacification. Allowing for this, no acute vascular findings are identified. The heart size is normal. There is no pericardial effusion. Mediastinum/Nodes: There are no enlarged mediastinal, hilar or axillary lymph nodes. The thyroid gland, trachea and esophagus demonstrate no significant findings. Lungs/Pleura: Again demonstrated is moderate-sized dependent right pleural effusion, similar to recent abdominal CT. Trace pleural fluid on the left. Mild dependent atelectasis in both lungs. There is a subpleural part solid nodule in the superior segment of the left lower lobe, measuring 1.4 x 0.9 cm on image 68/8. No other pulmonary nodules are identified. Upper abdomen: Findings in the visualized upper abdomen are unchanged from yesterday CT. These include a heterogeneous soft tissue mass at the splenic hilum measuring 9.5 x 7.7 cm on image 119/2, a subcapsular mass posteriorly in the right hepatic lobe, suspected bilateral renal masses and extensive confluent retroperitoneal and mesenteric lymphadenopathy. There is a small amount of ascites. Musculoskeletal/Chest wall: There is no chest wall mass or suspicious osseous finding. Scattered bone islands, most conspicuous within the right 5th and 7th ribs. IMPRESSION: 1. No thoracic adenopathy identified. 2. Unchanged moderate right and trace left pleural  effusions. 3. Nonspecific part solid nodule in the superior segment of the left lower lobe, potentially inflammatory. No other suspicious pulmonary nodules. Attention on follow-up recommended. 4. Stable findings in the upper abdomen from yesterday's CT, remaining suspicious for lymphoma. Electronically Signed   By: Richardean Sale M.D.   On: 09/04/2021 10:29   CT ABDOMEN PELVIS W CONTRAST  Result Date:  09/03/2021 CLINICAL DATA:  Abdominal pain and swelling for 1 month. Weight loss. Personal history of prostate carcinoma. EXAM: CT ABDOMEN AND PELVIS WITH CONTRAST TECHNIQUE: Multidetector CT imaging of the abdomen and pelvis was performed using the standard protocol following bolus administration of intravenous contrast. CONTRAST:  157mL ISOVUE-370 IOPAMIDOL (ISOVUE-370) INJECTION 76% COMPARISON:  None. FINDINGS: Lower Chest: Moderate right pleural effusion and mild right lower lobe atelectasis. A tiny left pleural effusion is seen, and a pleural-based nodule is seen in the posterior left lower lobe which is incompletely visualized, but measures at least 12 mm. Hepatobiliary: A 6 x 3 cm low-attenuation lesion is seen in the posterior right hepatic lobe which shows evidence of peripheral nodular contrast enhancement a 2nd lesion is also seen in the anterior right hepatic lobe which measures 2.3 cm and shows signs of mild peripheral contrast enhancement. These lesions are not definitively characterized on this exam, but may represent hemangiomas. Gallbladder is unremarkable. No evidence of biliary ductal dilatation. Pancreas:  No mass or inflammatory changes. Spleen: Within normal limits in size and appearance. Bulky soft tissue mass is seen in the left subdiaphragmatic region which abuts the spleen, measuring 8.6 x 8.1 cm on image 16/2. Adrenals/Urinary Tract: Adrenal glands are normal in appearance. Several ill-defined low-attenuation masses are seen in both kidneys which are difficult to measure due to their poorly defined margins. No evidence of hydronephrosis. Stomach/Bowel: Encasement of bowel loops by abnormal mesenteric soft tissue density is seen, however there is no evidence of bowel obstruction. Vascular/Lymphatic: Bulky abdominal lymphadenopathy is seen throughout the retroperitoneum and small bowel mesentery, which encases the mesenteric vessels, hilum, pancreas, and aorta and IVC. Mild retrocrural  lymphadenopathy also noted. Mild pelvic lymphadenopathy is seen in bilateral iliac lymph node chains and pelvic mesentery. Misty soft tissue density is seen throughout the mesenteric and omental fat, and mild ascites is also demonstrated. Reproductive: Brachytherapy seeds are seen throughout the prostate bed. Other: Small bilateral inguinal hernias are seen, both containing only fat. Musculoskeletal: No suspicious bone lesions identified. Nonaggressive appearing chondrous bone lesion is seen in the left ilium. IMPRESSION: Bulky abdominal and pelvic lymphadenopathy, ill-defined bilateral renal masses, diffuse mesenteric and omental misty opacity, and mild ascites. These findings are highly suspicious for lymphoma, with metastatic disease considered less likely. Two indeterminate liver lesions, which may represent hemangiomas. Consider abdomen MRI without and with contrast or PET-CT for further evaluation. Moderate right pleural effusion and tiny left pleural effusion, and left lower lobe pleural-based pulmonary nodule measuring at least 12 mm. Lymphoma or metastatic disease cannot be excluded. These results will be called to the ordering clinician or representative by the Radiologist Assistant, and communication documented in the PACS or [employer]. Electronically Signed   By: Marlaine Hind M.D.   On: 09/03/2021 08:43   US RENAL  Result Date: 09/09/2021 CLINICAL DATA:  Acute renal insufficiency. EXAM: RENAL / URINARY TRACT ULTRASOUND COMPLETE COMPARISON:  None FINDINGS: Right Kidney: Renal measurements: 12.5 x 6.5 x 6.9 cm = volume: 293 mL. Echogenicity within normal limits. No mass or hydronephrosis visualized. Left Kidney: Renal measurements: 14.2 x 2.8 x 7.4 cm =  volume: 453 mL. Echogenicity within normal limits. No mass or hydronephrosis visualized. Bladder: Not visualized. Other: There is a small perihepatic ascites. IMPRESSION: 1. Unremarkable kidneys. 2. Small ascites. Electronically Signed   By:  Anner Crete M.D.   On: 09/09/2021 19:48   [place] ASCITES (ABDOMEN LIMITED)  Result Date: 09/07/2021 CLINICAL DATA:  History of prostate cancer, now with bulky retroperitoneal and pelvic lymphadenopathy worrisome for lymphoma versus metastatic disease. Please perform image guided biopsy for tissue diagnostic purposes. EXAM: LIMITED ABDOMEN ULTRASOUND FOR ASCITES TECHNIQUE: Limited ultrasound survey for ascites was performed in all four abdominal quadrants. COMPARISON:  CT abdomen and pelvis-09/03/2021 FINDINGS: Sonographic evaluation of the abdomen demonstrates a trace amount of intra-abdominal ascites, too small to allow for safe ultrasound-guided paracentesis. No paracentesis attempted. IMPRESSION: Trace amount of intra-abdominal ascites, too small to allow for safe ultrasound-guided paracentesis. No paracentesis attempted. Electronically Signed   By: Sandi Mariscal M.D.   On: 09/07/2021 10:46   VAS [place] LOWER EXTREMITY VENOUS (DVT)  Result Date: 09/09/2021  Lower Venous DVT Study Patient Name:  Kenneth Wiggins  Date of Exam:   09/07/2021 Medical Rec #: [id number]        Accession #:    [id number] Date of Birth: 1961-04-23         Patient Gender: M Patient Age:   78 years Exam Location:  [facility] Procedure:      VAS [place] LOWER EXTREMITY VENOUS (DVT) Referring Phys: Annamaria Boots XU --------------------------------------------------------------------------------  Indications: Edema, abdomen distention w/ ascites, history of prostate cancer.  Limitations: Body habitus and poor ultrasound/tissue interface. Comparison Study: No prior studies. Performing Technologist: Darlin Coco RDMS, RVT  Examination Guidelines: A complete evaluation includes B-mode imaging, spectral Doppler, color Doppler, and power Doppler as needed of all accessible portions of each vessel. Bilateral testing is considered an integral part of a complete examination. Limited examinations for reoccurring indications may be performed as noted. The  reflux portion of the exam is performed with the patient in reverse Trendelenburg.  +---------+---------------+---------+-----------+----------+--------------+ RIGHT    CompressibilityPhasicitySpontaneityPropertiesThrombus Aging +---------+---------------+---------+-----------+----------+--------------+ CFV      Full           Yes      Yes                                 +---------+---------------+---------+-----------+----------+--------------+ SFJ      Full                                                        +---------+---------------+---------+-----------+----------+--------------+ FV Prox  Full                                                        +---------+---------------+---------+-----------+----------+--------------+ FV Mid   Full                                                        +---------+---------------+---------+-----------+----------+--------------+ FV DistalFull                                                        +---------+---------------+---------+-----------+----------+--------------+  PFV      Full                                                        +---------+---------------+---------+-----------+----------+--------------+ POP      Full           Yes      Yes                                 +---------+---------------+---------+-----------+----------+--------------+ PTV      Full                                                        +---------+---------------+---------+-----------+----------+--------------+ PERO     Full                                                        +---------+---------------+---------+-----------+----------+--------------+ Gastroc  Full                                                        +---------+---------------+---------+-----------+----------+--------------+   +---------+---------------+---------+-----------+----------+-------------------+ LEFT      CompressibilityPhasicitySpontaneityPropertiesThrombus Aging      +---------+---------------+---------+-----------+----------+-------------------+ CFV      Full           Yes      Yes                                      +---------+---------------+---------+-----------+----------+-------------------+ SFJ      Full                                                             +---------+---------------+---------+-----------+----------+-------------------+ FV Prox  Full                                                             +---------+---------------+---------+-----------+----------+-------------------+ FV Mid   Full                                                             +---------+---------------+---------+-----------+----------+-------------------+ FV DistalFull                                                             +---------+---------------+---------+-----------+----------+-------------------+  PFV      Full                                                             +---------+---------------+---------+-----------+----------+-------------------+ POP      Full                                                             +---------+---------------+---------+-----------+----------+-------------------+ PTV      Full                                                             +---------+---------------+---------+-----------+----------+-------------------+ PERO     Full                                         Not well visualized +---------+---------------+---------+-----------+----------+-------------------+ Gastroc  Full                                                             +---------+---------------+---------+-----------+----------+-------------------+     Summary: RIGHT: - There is no evidence of deep vein thrombosis in the lower extremity.  - No cystic structure found in the popliteal fossa.  LEFT: - There is no evidence of  deep vein thrombosis in the lower extremity. However, portions of this examination were limited- see technologist comments above.  - No cystic structure found in the popliteal fossa.  *See table(s) above for measurements and observations. Electronically signed by Orlie Pollen on 09/09/2021 at 10:17:25 AM.    Final    US THORACENTESIS ASP PLEURAL SPACE W/IMG GUIDE  Result Date: 09/07/2021 INDICATION: Shortness of breath. Right-sided pleural effusion. Request therapeutic and diagnostic thoracentesis. EXAM: ULTRASOUND GUIDED RIGHT THORACENTESIS MEDICATIONS: 1% plain lidocaine, 5 mL COMPLICATIONS: None immediate. PROCEDURE: An ultrasound guided thoracentesis was thoroughly discussed with the patient and questions answered. The benefits, risks, alternatives and complications were also discussed. The patient understands and wishes to proceed with the procedure. Written consent was obtained. Ultrasound was performed to localize and mark an adequate pocket of fluid in the right chest. The area was then prepped and draped in the normal sterile fashion. 1% Lidocaine was used for local anesthesia. Under ultrasound guidance a 6 Fr Safe-T-Centesis catheter was introduced. Thoracentesis was performed. The catheter was removed and a dressing applied. FINDINGS: A total of approximately 1.3 L of clear yellow fluid was removed. Samples were sent to the laboratory as requested by the clinical team. IMPRESSION: Successful ultrasound guided right thoracentesis yielding 1.3 L of pleural fluid. Read by: Ascencion Dike PA-C Electronically Signed   By: Sandi Mariscal M.D.   On: 09/07/2021 10:44  Subjective: Patient seen and examined the bedside this morning.  Hemodynamically stable for discharge.  Discharge Exam: Vitals:   09/10/21 1340 09/10/21 1345  BP: 125/74 116/74  Pulse: (!) 103 (!) 103  Resp: 17 16  Temp:    SpO2: 100% 98%   Vitals:   09/10/21 1131 09/10/21 1327 09/10/21 1340 09/10/21 1345  BP: 129/86 113/84  125/74 116/74  Pulse: (!) 106 (!) 104 (!) 103 (!) 103  Resp: 20 18 17 16   Temp: 98.1 F (36.7 C)     TempSrc: Oral     SpO2: 100% 100% 100% 98%  Weight:      Height:        General: Pt is alert, awake, not in acute distress Cardiovascular: RRR, S1/S2 +, no rubs, no gallops Respiratory: CTA bilaterally, no wheezing, no rhonchi Abdominal: Soft, NT, ND, bowel sounds + Extremities: no edema, no cyanosis    The results of significant diagnostics from this hospitalization (including imaging, microbiology, ancillary and laboratory) are listed below for reference.     Microbiology: Recent Results (from the past 240 hour(s))  Resp Panel by RT-PCR (Flu A&B, Covid) Nasopharyngeal Swab     Status: None   Collection Time: 09/06/21  3:48 PM   Specimen: Nasopharyngeal Swab; Nasopharyngeal(NP) swabs in vial transport medium  Result Value Ref Range Status   SARS Coronavirus 2 by RT PCR NEGATIVE NEGATIVE Final    Comment: (NOTE) SARS-CoV-2 target nucleic acids are NOT DETECTED.  The SARS-CoV-2 RNA is generally detectable in upper respiratory specimens during the acute phase of infection. The lowest concentration of SARS-CoV-2 viral copies this assay can detect is 138 copies/mL. A negative result does not preclude SARS-Cov-2 infection and should not be used as the sole basis for treatment or other patient management decisions. A negative result may occur with  improper specimen collection/handling, submission of specimen other than nasopharyngeal swab, presence of viral mutation(s) within the areas targeted by this assay, and inadequate number of viral copies(<138 copies/mL). A negative result must be combined with clinical observations, patient history, and epidemiological information. The expected result is Negative.  Fact Sheet for Patients:  EntrepreneurPulse.com.au  Fact Sheet for Healthcare Providers:  IncredibleEmployment.be  This test is no t  yet approved or cleared by the [place] FDA and  has been authorized for detection and/or diagnosis of SARS-CoV-2 by FDA under an Emergency Use Authorization (EUA). This EUA will remain  in effect (meaning this test can be used) for the duration of the COVID-19 declaration under Section 564(b)(1) of the Act, 21 U.S.C.section 360bbb-3(b)(1), unless the authorization is terminated  or revoked sooner.       Influenza A by PCR NEGATIVE NEGATIVE Final   Influenza B by PCR NEGATIVE NEGATIVE Final    Comment: (NOTE) The Xpert Xpress SARS-CoV-2/FLU/RSV plus assay is intended as an aid in the diagnosis of influenza from Nasopharyngeal swab specimens and should not be used as a sole basis for treatment. Nasal washings and aspirates are unacceptable for Xpert Xpress SARS-CoV-2/FLU/RSV testing.  Fact Sheet for Patients: EntrepreneurPulse.com.au  Fact Sheet for Healthcare Providers: IncredibleEmployment.be  This test is not yet approved or cleared by the [place] FDA and has been authorized for detection and/or diagnosis of SARS-CoV-2 by FDA under an Emergency Use Authorization (EUA). This EUA will remain in effect (meaning this test can be used) for the duration of the COVID-19 declaration under Section 564(b)(1) of the Act, 21 U.S.C. section 360bbb-3(b)(1), unless the authorization is terminated or revoked.  Performed at [facility], [address], [place], [place] [address]   Culture, body fluid w Gram Stain-bottle     Status: None (Preliminary result)   Collection Time: 09/07/21  9:25 AM   Specimen: Fluid  Result Value Ref Range Status   Specimen Description FLUID  Final   Special Requests NONE  Final   Culture   Final    NO GROWTH 3 DAYS Performed at [facility], [address], [place], [place] [address]    Report Status PENDING  Incomplete  Gram stain     Status: None   Collection Time: 09/07/21  9:25  AM   Specimen: Fluid  Result Value Ref Range Status   Specimen Description FLUID  Final   Special Requests NONE  Final   Gram Stain   Final    RARE WBC PRESENT, PREDOMINANTLY MONONUCLEAR NO ORGANISMS SEEN Performed at [facility], [place] [address], [place], [place] [address]    Report Status 09/07/2021 FINAL  Final     Labs: BNP (last 3 results) No results for input(s): BNP in the last 8760 hours. Basic Metabolic Panel: Recent Labs  Lab 09/06/21 1241 09/07/21 0351 09/08/21 0403 09/09/21 0410 09/10/21 0357  NA 140 135 137 138 136  K 4.4 4.6 4.2 4.1 4.0  CL 102 101 101 102 103  CO2 27 21* 24 24 23   GLUCOSE 97 99 104* 99 103*  BUN 34* 35* 45* 46* 43*  CREATININE 2.61* 3.03* 3.12* 3.04* 2.64*  CALCIUM 10.7* 9.4 9.1 9.1 8.9   Liver Function Tests: Recent Labs  Lab 09/06/21 1241 09/07/21 0351  AST 23 36  ALT 20 28  ALKPHOS 66 59  BILITOT 0.8 0.9  PROT 7.0 6.8  ALBUMIN 4.2 3.5   Recent Labs  Lab 09/06/21 1241  LIPASE 41   No results for input(s): AMMONIA in the last 168 hours. CBC: Recent Labs  Lab 09/06/21 1241 09/07/21 0351 09/08/21 0403  WBC 8.1 7.4 6.3  NEUTROABS  --  5.2 4.3  HGB 13.3 12.5* 11.9*  HCT 39.7 37.7* 36.6*  MCV 80.7 82.9 83.2  PLT 415* 406* 361   Cardiac Enzymes: No results for input(s): CKTOTAL, CKMB, CKMBINDEX, TROPONINI in the last 168 hours. BNP: Invalid input(s): POCBNP CBG: No results for input(s): GLUCAP in the last 168 hours. D-Dimer No results for input(s): DDIMER in the last 72 hours. Hgb A1c No results for input(s): HGBA1C in the last 72 hours. Lipid Profile No results for input(s): CHOL, HDL, LDLCALC, TRIG, CHOLHDL, LDLDIRECT in the last 72 hours. Thyroid function studies Recent Labs    09/08/21 0403  TSH 2.959   Anemia work up No results for input(s): VITAMINB12, FOLATE, FERRITIN, TIBC, IRON, RETICCTPCT in the last 72 hours. Urinalysis    Component Value Date/Time   COLORURINE YELLOW 09/06/2021 1800    APPEARANCEUR CLEAR 09/06/2021 1800   LABSPEC 1.015 09/06/2021 1800   PHURINE 5.0 09/06/2021 1800   GLUCOSEU NEGATIVE 09/06/2021 1800   HGBUR NEGATIVE 09/06/2021 1800   BILIRUBINUR NEGATIVE 09/06/2021 1800   KETONESUR NEGATIVE 09/06/2021 1800   PROTEINUR NEGATIVE 09/06/2021 1800   NITRITE NEGATIVE 09/06/2021 1800   LEUKOCYTESUR NEGATIVE 09/06/2021 1800   Sepsis Labs Invalid input(s): PROCALCITONIN,  WBC,  LACTICIDVEN Microbiology Recent Results (from the past 240 hour(s))  Resp Panel by RT-PCR (Flu A&B, Covid) Nasopharyngeal Swab     Status: None   Collection Time: 09/06/21  3:48 PM   Specimen: Nasopharyngeal Swab; Nasopharyngeal(NP) swabs  in vial transport medium  Result Value Ref Range Status   SARS Coronavirus 2 by RT PCR NEGATIVE NEGATIVE Final    Comment: (NOTE) SARS-CoV-2 target nucleic acids are NOT DETECTED.  The SARS-CoV-2 RNA is generally detectable in upper respiratory specimens during the acute phase of infection. The lowest concentration of SARS-CoV-2 viral copies this assay can detect is 138 copies/mL. A negative result does not preclude SARS-Cov-2 infection and should not be used as the sole basis for treatment or other patient management decisions. A negative result may occur with  improper specimen collection/handling, submission of specimen other than nasopharyngeal swab, presence of viral mutation(s) within the areas targeted by this assay, and inadequate number of viral copies(<138 copies/mL). A negative result must be combined with clinical observations, patient history, and epidemiological information. The expected result is Negative.  Fact Sheet for Patients:  EntrepreneurPulse.com.au  Fact Sheet for Healthcare Providers:  IncredibleEmployment.be  This test is no t yet approved or cleared by the [place] FDA and  has been authorized for detection and/or diagnosis of SARS-CoV-2 by FDA under an Emergency Use  Authorization (EUA). This EUA will remain  in effect (meaning this test can be used) for the duration of the COVID-19 declaration under Section 564(b)(1) of the Act, 21 U.S.C.section 360bbb-3(b)(1), unless the authorization is terminated  or revoked sooner.       Influenza A by PCR NEGATIVE NEGATIVE Final   Influenza B by PCR NEGATIVE NEGATIVE Final    Comment: (NOTE) The Xpert Xpress SARS-CoV-2/FLU/RSV plus assay is intended as an aid in the diagnosis of influenza from Nasopharyngeal swab specimens and should not be used as a sole basis for treatment. Nasal washings and aspirates are unacceptable for Xpert Xpress SARS-CoV-2/FLU/RSV testing.  Fact Sheet for Patients: EntrepreneurPulse.com.au  Fact Sheet for Healthcare Providers: IncredibleEmployment.be  This test is not yet approved or cleared by the [place] FDA and has been authorized for detection and/or diagnosis of SARS-CoV-2 by FDA under an Emergency Use Authorization (EUA). This EUA will remain in effect (meaning this test can be used) for the duration of the COVID-19 declaration under Section 564(b)(1) of the Act, 21 U.S.C. section 360bbb-3(b)(1), unless the authorization is terminated or revoked.  Performed at [facility], [address] [place], [place] [address]   Culture, body fluid w Gram Stain-bottle     Status: None (Preliminary result)   Collection Time: 09/07/21  9:25 AM   Specimen: Fluid  Result Value Ref Range Status   Specimen Description FLUID  Final   Special Requests NONE  Final   Culture   Final    NO GROWTH 3 DAYS Performed at [facility], [address] [place], [place] [address]    Report Status PENDING  Incomplete  Gram stain     Status: None   Collection Time: 09/07/21  9:25 AM   Specimen: Fluid  Result Value Ref Range Status   Specimen Description FLUID  Final   Special Requests NONE  Final   Gram Stain   Final     RARE WBC PRESENT, PREDOMINANTLY MONONUCLEAR NO ORGANISMS SEEN Performed at [facility], [place] [address] [place], [place] [address]    Report Status 09/07/2021 FINAL  Final    Please note: You were cared for by a hospitalist during your hospital stay. Once you are discharged, your primary care physician will handle any further medical issues. Please note that NO REFILLS for any discharge medications will be authorized once  you are discharged, as it is imperative that you return to your primary care physician (or establish a relationship with a primary care physician if you do not have one) for your post hospital discharge needs so that they can reassess your need for medications and monitor your lab values.    Time coordinating discharge: 40 minutes  SIGNED:   Shelly Coss, MD  [facility] 09/10/2021, 2:36 PM Pager [phone]  If 7PM-7AM, please contact night-coverage [email or web address] Password [email or web address]

## 2021-09-10 NOTE — Progress Notes (Signed)
[facility] Progress Note  Subjective: no c/o, wants to go home, creat down 2.4 today  Vitals:   09/09/21 1218 09/09/21 2041 09/10/21 0500 09/10/21 0512  BP: 106/67 114/81  (!) 106/48  Pulse: (!) 102 (!) 109  (!) 106  Resp: 16 20    Temp: 98.2 F (36.8 C) 98.4 F (36.9 C)  98.5 F (36.9 C)  TempSrc:  Oral  Oral  SpO2: 99% 99%  98%  Weight:   (!) 147 kg (!) 147.7 kg  Height:        Exam: Gen alert, no distress flat neck veins Chest clear bilat to bases RRR no MRG Abd markedly protuberant abdomen, no fluid wave, nontender GU normal male MS no joint effusions or deformity Ext 1-2+ bilat pretib edema, no hip or UE edema Neuro is alert, Ox 3 , nf    Home meds include - norvasc 10, hygroton 25, benicar 40 qd, percocet prn, MVI      CT abdomen +contrast 11/22 (done as outpatient)- Spleen: bulky soft tissue mass is seen in the left subdiaphragmatic region which abuts the spleen, measuring 8.6 x 8.1 cm on image 16/2.  Adrenals/Urinary Tract:  Several ill-defined low-attenuation masses are seen in both kidneys which are difficult to measure due to their poorly defined margins. No hydronephrosis. Stomach/Bowel: Encasement of bowel loops by abnormal mesenteric soft tissue density is seen, however there is no evidence of bowel obstruction. Vascular/Lymphatic: Bulky abdominal lymphadenopathy is seen throughout the retroperitoneum and small bowel mesentery, which encases the mesenteric vessels, hilum, pancreas, and aorta and IVC. Mild retrocrural lymphadenopathy also noted. Mild pelvic lymphadenopathy is seen in bilateral iliac lymph node chains and pelvic mesentery. Misty soft tissue density is seen throughout the mesenteric and omental fat, and mild ascites is also demonstrated     UA 11/25 - negative    UNa, UCr pending    CXR 11/26 post thora - FINDINGS: Normal heart size and mediastinal contours. No acute infiltrate or edema. No effusion or pneumothorax. No acute  osseous findings.    Na 138  K 4.1  CO2 24  BUN 46  Cr 3.04  cA 9.1  alb 3.5  Tport 6.8  Tbili 0.9  AST / ALT wnl   eGFR 22   wBC 7K  Hb 12.5    UNa 25, UCr 181              Date                       Creat               eGFR     2014                        0.9- 1.1     2018                        1.23          2019                        1.23 Feb 2021                 1.30                 > 60 ml/min     June 2022  1.35                 > 60 ml/min     Nov 25                     2.61                 27    Nov 26                      3.03                 23     Nov 27                     3.12                 22     Sep 09, 2021          3.04                 23     Assessment/ Plan: AKI - b/l creat from may-June 2022 was normal at 1.30, eGFR > 60 ml/min. Admitted w/ recent onset abd swelling w/ OP CT showing bulky intra-abdominal LAN, suspected lymphoma.  UNa borderline low. No stigmata of cirrhosis (swollen belly is not ascites) so doubt HRS. Suspect ATN due to OP IV contrast +ARB + soft BP's + possible intravascular hypovolemia.  Creat down today 2.4 w/ supportive care only, suspect early recovery from ATN.  Pt for dc home today. Have arranged OP f/u visit for AKI in our office, see dc section. Will sign off.  Abd distension - diffuse bulky LAN, IR to biopsy  HTN - home meds on hold, BP's 100- 110 range here. Avoid ARB/ acei.             Rob Katherine Tout 09/10/2021, 8:53 AM   Recent Labs  Lab 09/07/21 0351 09/08/21 0403 09/09/21 0410 09/10/21 0357  K 4.6 4.2 4.1 4.0  BUN 35* 45* 46* 43*  CREATININE 3.03* 3.12* 3.04* 2.64*  CALCIUM 9.4 9.1 9.1 8.9  HGB 12.5* 11.9*  --   --    Inpatient medications:  chlorhexidine  15 mL Mouth Rinse BID   heparin  5,000 Units Subcutaneous Q8H   mouth rinse  15 mL Mouth Rinse q12n4p    acetaminophen **OR** acetaminophen, ondansetron **OR** ondansetron (ZOFRAN) IV, oxyCODONE

## 2021-09-10 NOTE — Progress Notes (Addendum)
Pharmacist-Provider Communication  Sherwood Gambler. Kenneth Wiggins is a 60 yo male admitted with shortness of breath.  Found to have diffuse adenopathy as well as a large R pleural effusion. Also with lymphadenopathy on CT.  IR performed LN biopsy on 09/10/21. Pharmacy is consulted to resume anticoagulation.    Bleeding risk associated with procedure: low  Plan: -Resume subcutaneous heparin 5000 units q8hours this PM @ 2200  Pharmacy will sign off of consult and follow peripherally.  Dimple Nanas, PharmD 09/10/2021 2:22 PM

## 2021-09-11 LAB — CYTOLOGY - NON PAP

## 2021-09-12 LAB — CULTURE, BODY FLUID W GRAM STAIN -BOTTLE: Culture: NO GROWTH

## 2021-09-13 LAB — SURGICAL PATHOLOGY

## 2021-09-16 ENCOUNTER — Telehealth: Payer: Self-pay | Admitting: Physician Assistant

## 2021-09-16 NOTE — Telephone Encounter (Signed)
Scheduled appt per 11/27 referral. Pt is aware of appt date and time. Per Dr. Lorenso Courier, he said he is fine with [facility] seeing pt.

## 2021-09-17 ENCOUNTER — Encounter (HOSPITAL_COMMUNITY): Payer: Self-pay | Admitting: Internal Medicine

## 2021-09-17 ENCOUNTER — Inpatient Hospital Stay: Payer: 59 | Attending: Physician Assistant

## 2021-09-17 ENCOUNTER — Inpatient Hospital Stay (HOSPITAL_BASED_OUTPATIENT_CLINIC_OR_DEPARTMENT_OTHER): Payer: 59 | Admitting: Physician Assistant

## 2021-09-17 ENCOUNTER — Inpatient Hospital Stay (HOSPITAL_COMMUNITY): Payer: 59

## 2021-09-17 ENCOUNTER — Telehealth: Payer: Self-pay | Admitting: *Deleted

## 2021-09-17 ENCOUNTER — Other Ambulatory Visit: Payer: Self-pay

## 2021-09-17 ENCOUNTER — Inpatient Hospital Stay (HOSPITAL_COMMUNITY)
Admission: AD | Admit: 2021-09-17 | Discharge: 2021-09-19 | DRG: 683 | Disposition: A | Discharge status: Other Acute Inpatient Hospital | Payer: 59 | Source: Ambulatory Visit | Attending: Internal Medicine | Admitting: Internal Medicine

## 2021-09-17 VITALS — BP 148/97 | HR 115 | Temp 98.7°F | Resp 22 | Ht 71.0 in | Wt 325.7 lb

## 2021-09-17 DIAGNOSIS — I10 Essential (primary) hypertension: Secondary | ICD-10-CM

## 2021-09-17 DIAGNOSIS — R0602 Shortness of breath: Secondary | ICD-10-CM

## 2021-09-17 DIAGNOSIS — K219 Gastro-esophageal reflux disease without esophagitis: Secondary | ICD-10-CM | POA: Diagnosis present

## 2021-09-17 DIAGNOSIS — G4730 Sleep apnea, unspecified: Secondary | ICD-10-CM

## 2021-09-17 DIAGNOSIS — Z8546 Personal history of malignant neoplasm of prostate: Secondary | ICD-10-CM

## 2021-09-17 DIAGNOSIS — Z8042 Family history of malignant neoplasm of prostate: Secondary | ICD-10-CM | POA: Diagnosis not present

## 2021-09-17 DIAGNOSIS — C8443 Peripheral T-cell lymphoma, not classified, intra-abdominal lymph nodes: Secondary | ICD-10-CM | POA: Insufficient documentation

## 2021-09-17 DIAGNOSIS — Z6841 Body Mass Index (BMI) 40.0 and over, adult: Secondary | ICD-10-CM

## 2021-09-17 DIAGNOSIS — E861 Hypovolemia: Secondary | ICD-10-CM | POA: Diagnosis present

## 2021-09-17 DIAGNOSIS — Z79899 Other long term (current) drug therapy: Secondary | ICD-10-CM

## 2021-09-17 DIAGNOSIS — R188 Other ascites: Secondary | ICD-10-CM | POA: Diagnosis present

## 2021-09-17 DIAGNOSIS — C8490 Mature T/NK-cell lymphomas, unspecified, unspecified site: Secondary | ICD-10-CM

## 2021-09-17 DIAGNOSIS — C8590 Non-Hodgkin lymphoma, unspecified, unspecified site: Secondary | ICD-10-CM | POA: Diagnosis present

## 2021-09-17 DIAGNOSIS — I129 Hypertensive chronic kidney disease with stage 1 through stage 4 chronic kidney disease, or unspecified chronic kidney disease: Secondary | ICD-10-CM | POA: Diagnosis present

## 2021-09-17 DIAGNOSIS — N182 Chronic kidney disease, stage 2 (mild): Secondary | ICD-10-CM | POA: Diagnosis present

## 2021-09-17 DIAGNOSIS — C61 Malignant neoplasm of prostate: Secondary | ICD-10-CM | POA: Diagnosis not present

## 2021-09-17 DIAGNOSIS — I890 Lymphedema, not elsewhere classified: Secondary | ICD-10-CM | POA: Diagnosis present

## 2021-09-17 DIAGNOSIS — J45909 Unspecified asthma, uncomplicated: Secondary | ICD-10-CM | POA: Diagnosis present

## 2021-09-17 DIAGNOSIS — G4733 Obstructive sleep apnea (adult) (pediatric): Secondary | ICD-10-CM | POA: Diagnosis present

## 2021-09-17 DIAGNOSIS — E883 Tumor lysis syndrome: Secondary | ICD-10-CM | POA: Diagnosis present

## 2021-09-17 DIAGNOSIS — J90 Pleural effusion, not elsewhere classified: Secondary | ICD-10-CM | POA: Diagnosis present

## 2021-09-17 DIAGNOSIS — R911 Solitary pulmonary nodule: Secondary | ICD-10-CM | POA: Diagnosis present

## 2021-09-17 DIAGNOSIS — R591 Generalized enlarged lymph nodes: Secondary | ICD-10-CM | POA: Diagnosis not present

## 2021-09-17 DIAGNOSIS — N179 Acute kidney failure, unspecified: Secondary | ICD-10-CM | POA: Diagnosis present

## 2021-09-17 LAB — CBC WITH DIFFERENTIAL (CANCER CENTER ONLY)
Abs Immature Granulocytes: 0.06 10*3/uL (ref 0.00–0.07)
Basophils Absolute: 0.1 10*3/uL (ref 0.0–0.1)
Basophils Relative: 1 %
Eosinophils Absolute: 0.1 10*3/uL (ref 0.0–0.5)
Eosinophils Relative: 1 %
HCT: 35.2 % — ABNORMAL LOW (ref 39.0–52.0)
Hemoglobin: 11.9 g/dL — ABNORMAL LOW (ref 13.0–17.0)
Immature Granulocytes: 1 %
Lymphocytes Relative: 7 %
Lymphs Abs: 0.6 10*3/uL — ABNORMAL LOW (ref 0.7–4.0)
MCH: 27 pg (ref 26.0–34.0)
MCHC: 33.8 g/dL (ref 30.0–36.0)
MCV: 80 fL (ref 80.0–100.0)
Monocytes Absolute: 1.2 10*3/uL — ABNORMAL HIGH (ref 0.1–1.0)
Monocytes Relative: 15 %
Neutro Abs: 6.1 10*3/uL (ref 1.7–7.7)
Neutrophils Relative %: 75 %
Platelet Count: 364 10*3/uL (ref 150–400)
RBC: 4.4 MIL/uL (ref 4.22–5.81)
RDW: 13.6 % (ref 11.5–15.5)
WBC Count: 8.2 10*3/uL (ref 4.0–10.5)
nRBC: 0 % (ref 0.0–0.2)

## 2021-09-17 LAB — CMP (CANCER CENTER ONLY)
ALT: 27 U/L (ref 0–44)
AST: 19 U/L (ref 15–41)
Albumin: 3.3 g/dL — ABNORMAL LOW (ref 3.5–5.0)
Alkaline Phosphatase: 71 U/L (ref 38–126)
Anion gap: 14 (ref 5–15)
BUN: 41 mg/dL — ABNORMAL HIGH (ref 6–20)
CO2: 20 mmol/L — ABNORMAL LOW (ref 22–32)
Calcium: 9.5 mg/dL (ref 8.9–10.3)
Chloride: 108 mmol/L (ref 98–111)
Creatinine: 4.23 mg/dL (ref 0.61–1.24)
GFR, Estimated: 15 mL/min — ABNORMAL LOW (ref >60)
Glucose, Bld: 96 mg/dL (ref 70–99)
Potassium: 5 mmol/L (ref 3.5–5.1)
Sodium: 142 mmol/L (ref 135–145)
Total Bilirubin: 0.5 mg/dL (ref 0.3–1.2)
Total Protein: 6.8 g/dL (ref 6.5–8.1)

## 2021-09-17 LAB — PHOSPHORUS: Phosphorus: 6.3 mg/dL — ABNORMAL HIGH (ref 2.5–4.6)

## 2021-09-17 LAB — LACTATE DEHYDROGENASE: LDH: 211 U/L — ABNORMAL HIGH (ref 98–192)

## 2021-09-17 LAB — URIC ACID
Uric Acid, Serum: 12.3 mg/dL — ABNORMAL HIGH (ref 3.7–8.6)
Uric Acid, Serum: 11.7 mg/dL — ABNORMAL HIGH (ref 3.7–8.6)

## 2021-09-17 LAB — CK: Total CK: 111 U/L (ref 49–397)

## 2021-09-17 MED ORDER — ALBUTEROL SULFATE (2.5 MG/3ML) 0.083% IN NEBU: 2.5000 mg | INHALATION_SOLUTION | Freq: Four times a day (QID) | RESPIRATORY_TRACT | Status: DC | PRN

## 2021-09-17 MED ORDER — ALBUTEROL SULFATE (2.5 MG/3ML) 0.083% IN NEBU
2.5000 mg | INHALATION_SOLUTION | Freq: Four times a day (QID) | RESPIRATORY_TRACT | Status: DC | PRN
  Administered 2021-09-19: 2.5 mg via RESPIRATORY_TRACT
  Filled 2021-09-17: qty 3

## 2021-09-17 MED ORDER — SODIUM CHLORIDE 0.9 % IV SOLN: INTRAVENOUS | Status: DC

## 2021-09-17 MED ORDER — ALBUTEROL SULFATE HFA 108 (90 BASE) MCG/ACT IN AERS: 2.0000 | INHALATION_SPRAY | Freq: Four times a day (QID) | RESPIRATORY_TRACT | Status: DC | PRN

## 2021-09-17 MED ORDER — ONDANSETRON HCL 4 MG PO TABS: 4.0000 mg | ORAL_TABLET | Freq: Four times a day (QID) | ORAL | Status: DC | PRN

## 2021-09-17 MED ORDER — ACETAMINOPHEN 650 MG RE SUPP: 650.0000 mg | Freq: Four times a day (QID) | RECTAL | Status: DC | PRN

## 2021-09-17 MED ORDER — HEPARIN SODIUM (PORCINE) 5000 UNIT/ML IJ SOLN
5000.0000 [IU] | Freq: Three times a day (TID) | INTRAMUSCULAR | Status: DC
  Administered 2021-09-17 – 2021-09-19 (×6): 5000 [IU] via SUBCUTANEOUS
  Filled 2021-09-17 (×3): qty 1

## 2021-09-17 MED ORDER — ALLOPURINOL 300 MG PO TABS
300.0000 mg | ORAL_TABLET | Freq: Every day | ORAL | Status: DC
  Administered 2021-09-17 – 2021-09-19 (×3): 300 mg via ORAL
  Filled 2021-09-17 (×3): qty 1

## 2021-09-17 MED ORDER — OXYCODONE HCL 5 MG PO TABS
5.0000 mg | ORAL_TABLET | Freq: Four times a day (QID) | ORAL | Status: DC | PRN
  Administered 2021-09-17 – 2021-09-19 (×7): 5 mg via ORAL
  Filled 2021-09-17 (×7): qty 1

## 2021-09-17 MED ORDER — SODIUM CHLORIDE 0.9 % IV SOLN
6.0000 mg | Freq: Once | INTRAVENOUS | Status: AC
  Administered 2021-09-17: 6 mg via INTRAVENOUS
  Filled 2021-09-17: qty 4

## 2021-09-17 MED ORDER — DOCUSATE SODIUM 100 MG PO CAPS: 100.0000 mg | ORAL_CAPSULE | Freq: Two times a day (BID) | ORAL | Status: DC

## 2021-09-17 MED ORDER — OXYCODONE HCL 5 MG PO TABS
5.0000 mg | ORAL_TABLET | Freq: Four times a day (QID) | ORAL | 0 refills | Status: DC | PRN
Start: 2021-09-17 — End: 2021-12-02

## 2021-09-17 MED ORDER — ONDANSETRON HCL 4 MG/2ML IJ SOLN: 4.0000 mg | Freq: Four times a day (QID) | INTRAMUSCULAR | Status: DC | PRN

## 2021-09-17 MED ORDER — ACETAMINOPHEN 325 MG PO TABS: 650.0000 mg | ORAL_TABLET | Freq: Four times a day (QID) | ORAL | Status: DC | PRN

## 2021-09-17 NOTE — H&P (Addendum)
[facility] History and Physical  LORENCE NAGENGAST FXT:[id number] DOB: [birth year] DOA: 09/17/2021  Referring physician: ED  PCP: Deland Pretty, MD   Patient is coming from: Home  Chief Complaint: Worsening kidney function  HPI: Kenneth Wiggins is a 60 y.o. male with past medical history of asthma, ascites, prostate cancer,  GERD, sleep apnea, hypertension, hiatal hernia recent admission for lymphoma had been to the cancer center today when he was noted to have elevated creatinine level.  Patient was recently admitted hospital for with similar problem with AKI.  Patient was noted to have a creatinine of 4.2 and was referred to [facility] long hospital.    Has been having bowel movements 3-4 times a day mostly loose for the last 2 to 3 weeks.  He however endorses  decreased appetite.  No nausea or vomiting.  Denies any urinary dysuria, frequency urgency or urinary retention.  Patient denies recent travel or sick contacts.  No dizziness lightheadedness or syncope.  No headache.  Patient was referred from cancer center today for acute kidney injury.  Patient complains of generalized weakness and fatigue.  He also complains of abdominal pain around 7/10 in intensity, diffuse in nature secondary to abdominal distention and has trouble lying flat..  Denies any fever, chills or rigor.  Denies night sweats.  Review of Systems:  All systems were reviewed and were negative unless otherwise mentioned in the HPI  Past Medical History:  Diagnosis Date   Ascites    Asthma as child   hx of   Cancer [facility])    prostate - psa 3.93 on 07/06/2012   GERD (gastroesophageal reflux disease)    hx of   H/O hiatal hernia 15 years ago   Hypertension    Prostate cancer ([place]) 08/09/2012   Gleason 3+3=6, vol 20-25 gm   Prostate cancer ([place]) 01/10/2013   Seed implant to be done due to Robotic surgery aborted   Sleep apnea    CPAP   Past Surgical History:  Procedure Laterality Date   CARPAL TUNNEL RELEASE Right  04/03/2021   Procedure: RIGHT CARPAL TUNNEL RELEASE;  Surgeon: Leandrew Koyanagi, MD;  Location: [place];  Service: Orthopedics;  Laterality: Right;   HERNIA REPAIR  as child   KNEE SURGERY Left as child   tore a ligament    MASS EXCISION Right 03/07/2021   Procedure: EXCISION MASS OF RIGHT CHEST;  Surgeon: Johnathan Hausen, MD;  Location: [facility];  Service: General;  Laterality: Right;   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   RADIOACTIVE SEED IMPLANT N/A 01/17/2013   Procedure: RADIOACTIVE SEED IMPLANT;  Surgeon: Bernestine Amass, MD;  Location: [facility];  Service: Urology;  Laterality: N/A;  W/ MURRAY    ROBOT ASSISTED LAPAROSCOPIC RADICAL PROSTATECTOMY N/A 11/24/2012   Procedure: Attempted ROBOTIC ASSISTED LAPAROSCOPIC RADICAL PROSTATECTOMY, Abandoned;  Surgeon: Bernestine Amass, MD;  Location: [facility];  Service: Urology;  Laterality: N/A;   scrotum explotion Left    SHOULDER ARTHROSCOPY WITH ROTATOR CUFF REPAIR AND SUBACROMIAL DECOMPRESSION Right 04/03/2021   Procedure: RIGHT SHOULDER ARTHROSCOPY ROTATOR CUFF REPAIR, SUBACROMIAL DECOMPRESSION, EXTENSIVE DEBRIDEMENT;  Surgeon: Leandrew Koyanagi, MD;  Location: [place];  Service: Orthopedics;  Laterality: Right;   testical growth removed     VASECTOMY      Social History:  reports that he has never smoked. He has never used smokeless tobacco. He reports that he does not drink alcohol and does not use drugs.  No Known Allergies  Family History  Problem Relation Age of Onset   Cancer Maternal Aunt        colon   Cancer Paternal Uncle        prostate, 2 uncles   Cancer Daughter        angiosarcoma age 28   Prostate cancer Other      Prior to Admission medications   Medication Sig Start Date End Date Taking? Authorizing Provider  Multiple Vitamins-Minerals (MULTIVITAMIN WITH MINERALS) tablet Take 1 tablet by mouth daily.    [provider]  oxyCODONE (OXY IR/ROXICODONE) 5 MG immediate release tablet Take 1 tablet (5 mg total) by mouth every  6 (six) hours as needed for pain. 09/17/21   Lincoln Brigham, PA-C    Physical Exam: There were no vitals filed for this visit. Wt Readings from Last 3 Encounters:  09/17/21 (!) 147.7 kg  09/10/21 (!) 147.7 kg  04/03/21 (!) 154.2 kg   There is no height or weight on file to calculate BMI.  General: Obese built, not in obvious distress HENT: Normocephalic, pupils equally reacting to light and accommodation.  No scleral pallor or icterus noted. Oral mucosa is moist.  Chest:  .  Diminished breath sounds bilaterally.  CVS: S1 &S2 heard. No murmur.  Regular rate and rhythm.  Mild tachycardia. Abdomen: Soft, nontender but distended dull abdomen.  Bowel sounds are heard.  Extremities: No cyanosis, clubbing bilateral lymphedema.  Peripheral pulses are palpable. Psych: Alert, awake and oriented, normal mood CNS:  No cranial nerve deficits.  Power equal in all extremities.   No cerebellar signs.   Skin: Warm and dry.  No rashes noted.  Labs on Admission:   CBC: Recent Labs  Lab 09/17/21 1224  WBC 8.2  NEUTROABS 6.1  HGB 11.9*  HCT 35.2*  MCV 80.0  PLT 329    Basic Metabolic Panel: Recent Labs  Lab 09/17/21 1224  NA 142  K 5.0  CL 108  CO2 20*  GLUCOSE 96  BUN 41*  CREATININE 4.23*  CALCIUM 9.5  PHOS 6.3*    Liver Function Tests: Recent Labs  Lab 09/17/21 1224  AST 19  ALT 27  ALKPHOS 71  BILITOT 0.5  PROT 6.8  ALBUMIN 3.3*   No results for input(s): LIPASE, AMYLASE in the last 168 hours. No results for input(s): AMMONIA in the last 168 hours.  Cardiac Enzymes: No results for input(s): CKTOTAL, CKMB, CKMBINDEX, TROPONINI in the last 168 hours.  BNP (last 3 results) No results for input(s): BNP in the last 8760 hours.  ProBNP (last 3 results) No results for input(s): PROBNP in the last 8760 hours.  CBG: No results for input(s): GLUCAP in the last 168 hours.   Drugs of Abuse  No results found for: LABOPIA, [place], [place], [place], [facility], [place]     Radiological Exams on Admission: No results found.  EKG: Not available for review.  Assessment/Plan Active Problems:   Prostate cancer (HCC)   Ascites   OSA (obstructive sleep apnea)   Lymphadenopathy   AKI (acute kidney injury) ([place])   Acute kidney injury on CKD stage II.Marland Kitchen  Creatinine of 4.2 at this time.  Baseline creatinine of around 1.3.  Creatinine on 11/29 was 2.6.  No previous history of chronic kidney disease.  On previous admission CT scan of the abdomen did not show any obstructive nephropathy. patient had recent admission for AKI and pulmonary was consulted at that time.  He was supposed to follow-up with nephrology as outpatient.  Since patient has recent diagnosis of lymphoma we will have to rule out tumor lysis syndrome.  Uric acid level stat check CK levels.  Check urinalysis start.  Bladder scan.  We will get ultrasound of the kidneys to rule out hydronephrosis.  Patient has been having diarrhea for the last 2 to 3 weeks.  We will continue with normal saline at 150 mill per hour.  Could consider nephrology evaluation if not improving.  Ascites, history of pleural effusion on the right side, recent soft tissue mass at the splenic hilum in extensive retroperitoneal and mesenteric lymphadenopathy.  Patient underwent lymph node biopsy for high suspicion of lymphoma.  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  Patient follows up with Dr. Lorenso Courier.  We will notify oncology.  Will get ultrasound-guided paracentesis to rule out SBP, check cytology. Patient had a history of right-sided thoracocentesis with removal of 1.3 L of pleural fluid on 09/07/2021.  Fluid fluid cytology showed reactive mesothelial cell and lymphoid cells.  History of of asthma but no active wheezing.  Added inhaler.  Have difficulty sleeping due to distended abdomen.  Sinus tachycardia.  TSH was unremarkable in the last admission.  Still has mild sinus  tachycardia.  Bilateral lower extremity pitting edema.  Chronic likely lymphedema.  Venous duplex was negative a week back.  Class III obesity, obstructive sleep apnea on CPAP.  We will continue while in the hospital.  History of prostate cancer.  No active issues, however will bladder scan.  Check renal ultrasound.  DVT Prophylaxis: Heparin subcu  Consultant: None, will notify oncology.  Code Status: Full code  Microbiology none  Antibiotics: None  Family Communication:  Patients' condition and plan of care including tests being ordered have been discussed with the patient and the patient's aunt at bedside who indicate understanding and agree with the plan.   Status is: Inpatient  Remains inpatient appropriate because: Acute kidney injury, need for closer monitoring,  Severity of Illness: The appropriate patient status for this patient is INPATIENT. Inpatient status is judged to be reasonable and necessary in order to provide the required intensity of service to ensure the patient's safety. The patient's presenting symptoms, physical exam findings, and initial radiographic and laboratory data in the context of their chronic comorbidities is felt to place them at high risk for further clinical deterioration. Furthermore, it is not anticipated that the patient will be medically stable for discharge from the hospital within 2 midnights of admission.   I certify that at the point of admission it is my clinical judgment that the patient will require inpatient hospital care spanning beyond 2 midnights from the point of admission due to high intensity of service, high risk for further deterioration and high frequency of surveillance required.  Signed, Flora Lipps, MD [facility] 09/17/2021

## 2021-09-17 NOTE — Telephone Encounter (Signed)
Critical call from [facility] in the lab. Pt Creatinine 4.23. Dede Query, PA made aware.

## 2021-09-17 NOTE — Progress Notes (Signed)
Pt stated his aunt is bringing his cpap machine from home and does not need assistance with setup.  Pt was advised that RT is available all night should he need assistance.

## 2021-09-17 NOTE — Progress Notes (Signed)
[place] [phone]   Fax:([phone] [place] NOTE  Patient Care Team: Deland Pretty, MD as PCP - General (Internal Medicine)  Hematological/Oncological History 1) Initially presented with worsening abdominal distention and shortness of breath x 4 weeks  2) 09/03/2021: CT abdomen/pelvis: Bulky abdominal and pelvic lymphadenopathy, ill-defined bilateral renal masses, diffuse mesenteric and omental misty opacity and mild ascites.  2 indeterminate liver lesions, may represent hemangiomas.  Moderate right pleural effusion and tiny left pleural effusion.  Left lower lobe pleural-based pulmonary nodule measuring at least 12 mm  3) 09/04/2021:CT chest: No thoracic adenopathy identified.  Unchanged moderate right and trace left pleural effusions.  Nonspecific part solid nodule in the superior segment of the left lower lobe, potentially inflammatory.  4) 09/07/2021: Underwent right thoracentesis removing 1.3 L of pleural fluid.  Cytology revealed reactive mesothelial cells and numerous lymphoid cells. Findings favored to be reactive in nature.  5) 09/10/2021: US guided left inguinal lymph node core biopsy.  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  6) 09/17/2021: Established care with [facility]  CHIEF COMPLAINTS/PURPOSE OF CONSULTATION:  "T-cell lymphoma "  HISTORY OF PRESENTING ILLNESS:  Kenneth Wiggins 60 y.o. male with medical history significant for asthma, GERD, hypertension, sleep apnea, stage T1C prostate cancer s/p seed implantation in April 2014.  Patient is accompanied by his aunt for this visit.  On exam today, reports fatigue but is able to ambulate.  Due to abdominal distention and pain, patient is having difficulty with shortness of breath.  He reports mid abdominal pain can be as high as 9 out of 10 on a pain scale. He is currently not taking any pain medication. Additionally, he has  noticed clear fluid draining from his umbilicus. He reports early satiety but denies any dietary changes. He denies any nausea or vomiting. He has some constipation with straining but has daily bowel movements. He denies easy bruising or signs of bleeding. He has chronic bilateral lower extremity edema. Patient denies fevers, chills, night sweats, chest pain cough or neuropathy. He has no other complaints. Rest of the 10 point ROS is below.   MEDICAL HISTORY:  Past Medical History:  Diagnosis Date   Ascites    Asthma as child   hx of   Cancer [facility])    prostate - psa 3.93 on 07/06/2012   GERD (gastroesophageal reflux disease)    hx of   H/O hiatal hernia 15 years ago   Hypertension    Prostate cancer ([place]) 08/09/2012   Gleason 3+3=6, vol 20-25 gm   Prostate cancer ([place]) 01/10/2013   Seed implant to be done due to Robotic surgery aborted   Sleep apnea    CPAP    SURGICAL HISTORY: Past Surgical History:  Procedure Laterality Date   CARPAL TUNNEL RELEASE Right 04/03/2021   Procedure: RIGHT CARPAL TUNNEL RELEASE;  Surgeon: Leandrew Koyanagi, MD;  Location: [place];  Service: Orthopedics;  Laterality: Right;   HERNIA REPAIR  as child   KNEE SURGERY Left as child   tore a ligament    MASS EXCISION Right 03/07/2021   Procedure: EXCISION MASS OF RIGHT CHEST;  Surgeon: Johnathan Hausen, MD;  Location: [facility];  Service: General;  Laterality: Right;   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   RADIOACTIVE SEED IMPLANT N/A 01/17/2013   Procedure: RADIOACTIVE SEED IMPLANT;  Surgeon: Bernestine Amass, MD;  Location: [facility];  Service: Urology;  Laterality: N/A;  W/ MURRAY  ROBOT ASSISTED LAPAROSCOPIC RADICAL PROSTATECTOMY N/A 11/24/2012   Procedure: Attempted ROBOTIC ASSISTED LAPAROSCOPIC RADICAL PROSTATECTOMY, Abandoned;  Surgeon: Bernestine Amass, MD;  Location: [facility];  Service: Urology;  Laterality: N/A;   scrotum explotion Left    SHOULDER ARTHROSCOPY WITH ROTATOR CUFF REPAIR AND SUBACROMIAL  DECOMPRESSION Right 04/03/2021   Procedure: RIGHT SHOULDER ARTHROSCOPY ROTATOR CUFF REPAIR, SUBACROMIAL DECOMPRESSION, EXTENSIVE DEBRIDEMENT;  Surgeon: Leandrew Koyanagi, MD;  Location: [facility];  Service: Orthopedics;  Laterality: Right;   testical growth removed     VASECTOMY      SOCIAL HISTORY: Social History   Socioeconomic History   Marital status: Widowed    Spouse name: Not on file   Number of children: 2   Years of education: Not on file   Highest education level: Not on file  Occupational History   Occupation: TRUCK DRIVER     Employer: [employer]  Tobacco Use   Smoking status: Never   Smokeless tobacco: Never  Vaping Use   Vaping Use: Never used  Substance and Sexual Activity   Alcohol use: No   Drug use: No   Sexual activity: Yes  Other Topics Concern   Not on file  Social History Narrative   Not on file   Social Determinants of Health   Financial Resource Strain: Not on file  Food Insecurity: Not on file  Transportation Needs: Not on file  Physical Activity: Not on file  Stress: Not on file  Social Connections: Not on file  Intimate Partner Violence: Not on file    FAMILY HISTORY: Family History  Problem Relation Age of Onset   Cancer Maternal Aunt        colon   Cancer Paternal Uncle        prostate, 2 uncles   Cancer Daughter        angiosarcoma age 6   Prostate cancer Other     ALLERGIES:  has No Known Allergies.  MEDICATIONS:  No current facility-administered medications for this visit.   No current outpatient medications on file.   Facility-Administered Medications Ordered in Other Visits  Medication Dose Route Frequency Provider Last Rate Last Admin   0.9 %  sodium chloride infusion   Intravenous Continuous Pokhrel, Laxman, MD       acetaminophen (TYLENOL) tablet 650 mg  650 mg Oral Q6H PRN Pokhrel, Laxman, MD       Or   acetaminophen (TYLENOL) suppository 650 mg  650 mg Rectal Q6H PRN Pokhrel, Laxman, MD       albuterol  (PROVENTIL) (2.5 MG/3ML) 0.083% nebulizer solution 2.5 mg  2.5 mg Nebulization Q6H PRN Pokhrel, Laxman, MD       docusate sodium (COLACE) capsule 100 mg  100 mg Oral BID Pokhrel, Laxman, MD       heparin injection 5,000 Units  5,000 Units Subcutaneous Q8H Pokhrel, Laxman, MD       ondansetron (ZOFRAN) tablet 4 mg  4 mg Oral Q6H PRN Pokhrel, Laxman, MD       Or   ondansetron (ZOFRAN) injection 4 mg  4 mg Intravenous Q6H PRN Pokhrel, Laxman, MD       oxyCODONE (Oxy IR/ROXICODONE) immediate release tablet 5 mg  5 mg Oral Q6H PRN Pokhrel, Laxman, MD   5 mg at 09/17/21 1656    REVIEW OF SYSTEMS:   Constitutional: ( - ) fevers, ( - )  chills , ( - ) night sweats Eyes: ( - ) blurriness of vision, ( - )  double vision, ( - ) watery eyes Ears, nose, mouth, throat, and face: ( - ) mucositis, ( - ) sore throat Respiratory: ( - ) cough, ( - ) dyspnea, ( - ) wheezes Cardiovascular: ( - ) palpitation, ( - ) chest discomfort, ( + ) lower extremity swelling Gastrointestinal:  ( - ) nausea, ( - ) heartburn, ( - ) change in bowel habits Skin: ( - ) abnormal skin rashes Lymphatics: ( - ) new lymphadenopathy, ( - ) easy bruising Neurological: ( - ) numbness, ( - ) tingling, ( - ) new weaknesses Behavioral/Psych: ( - ) mood change, ( - ) new changes  All other systems were reviewed with the patient and are negative.  PHYSICAL EXAMINATION: ECOG PERFORMANCE STATUS: 2 - Symptomatic, <50% confined to bed  Vitals:   09/17/21 1056  BP: (!) 148/97  Pulse: (!) 115  Resp: (!) 22  Temp: 98.7 F (37.1 C)  SpO2: 100%   Filed Weights   09/17/21 1056  Weight: (!) 325 lb 11.2 oz (147.7 kg)    GENERAL: well appearing African American male in NAD  SKIN: skin color, texture, turgor are normal, no rashes or significant lesions EYES: conjunctiva are pink and non-injected, sclera clear OROPHARYNX: no exudate, no erythema; lips, buccal mucosa, and tongue normal  NECK: supple, non-tender LYMPH:  no palpable  lymphadenopathy in the cervical, axillary or supraclavicular lymph nodes.  LUNGS: clear to auscultation and percussion with normal breathing effort HEART: regular rate & rhythm and no murmurs. Bilateral lower extremity pitting edema.  ABDOMEN: Normal bowel sounds. Diffuse abdominal distention with tenderness to palpation in mid abdomen.  Musculoskeletal: no cyanosis of digits and no clubbing  PSYCH: alert & oriented x 3, fluent speech NEURO: no focal motor/sensory deficits  LABORATORY DATA:  I have reviewed the data as listed CBC Latest Ref Rng & Units 09/17/2021 09/08/2021 09/07/2021  WBC 4.0 - 10.5 K/uL 8.2 6.3 7.4  Hemoglobin 13.0 - 17.0 g/dL 11.9(L) 11.9(L) 12.5(L)  Hematocrit 39.0 - 52.0 % 35.2(L) 36.6(L) 37.7(L)  Platelets 150 - 400 K/uL 364 361 406(H)    CMP Latest Ref Rng & Units 09/17/2021 09/10/2021 09/09/2021  Glucose 70 - 99 mg/dL 96 103(H) 99  BUN 6 - 20 mg/dL 41(H) 43(H) 46(H)  Creatinine 0.61 - 1.24 mg/dL 4.23(HH) 2.64(H) 3.04(H)  Sodium 135 - 145 mmol/L 142 136 138  Potassium 3.5 - 5.1 mmol/L 5.0 4.0 4.1  Chloride 98 - 111 mmol/L 108 103 102  CO2 22 - 32 mmol/L 20(L) 23 24  Calcium 8.9 - 10.3 mg/dL 9.5 8.9 9.1  Total Protein 6.5 - 8.1 g/dL 6.8 - -  Total Bilirubin 0.3 - 1.2 mg/dL 0.5 - -  Alkaline Phos 38 - 126 U/L 71 - -  AST 15 - 41 U/L 19 - -  ALT 0 - 44 U/L 27 - -     PATHOLOGY: SURGICAL PATHOLOGY  CASE: [id number]  PATIENT: Kenneth Wiggins  Surgical Pathology Report   FINAL MICROSCOPIC DIAGNOSIS:   A. 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  By immunohistochemistry,  there is an admixture of B and T cells by CD20 and CD3 respectively.  However, PAX5 is only positive in a subset of lymphocytes which are  predominantly nodular in distribution.  CD5 is  strongly positive in a  subset of lymphocytes and weak in the remaining lymphocytes.  CD4 is  positive but CD8 is negative. CD10, Cyclin D1, EMA and EBV by in situ  hybridization are negative.CD30 highlights scattered positive cells  which are favored to be reactive immunoblasts.  CD15 positive in  scattered neutrophils. CD68 highlights scattered histiocytes.  Flow  cytometry performed on the sample ([id number]) identified an atypical  T-cell population expressing CD3, CD2, CD5 (dim), CD4 (bright), CD20,  CD38 and CD200 comprising 44% of all lymphocytes.  Overall, these  findings are concerning for a T-cell lymphoma. RADIOGRAPHIC STUDIES: I have personally reviewed the radiological images as listed and agreed with the findings in the report. DG Chest 1 View  Result Date: 09/07/2021 CLINICAL DATA:  Right thoracentesis EXAM: CHEST  1 VIEW COMPARISON:  Yesterday FINDINGS: Normal heart size and mediastinal contours. No acute infiltrate or edema. No effusion or pneumothorax. No acute osseous findings. IMPRESSION: No evidence of thoracentesis complication. No visible residual fluid. Electronically Signed   By: Jorje Guild M.D.   On: 09/07/2021 09:19   DG Chest 2 View  Result Date: 09/06/2021 CLINICAL DATA:  Shortness of breath EXAM: CHEST - 2 VIEW COMPARISON:  Chest radiograph 03/06/2018, CT chest 09/04/2021 FINDINGS: The cardiomediastinal silhouette is within normal limits. There is a moderate size right pleural effusion with adjacent opacity in the right base likely reflecting atelectasis. The right upper lung is well-aerated. There is a smaller left pleural effusion. The left lung is otherwise clear. There is no pneumothorax. There is no acute osseous abnormality. IMPRESSION: Moderate right and small left pleural effusions with adjacent right basilar atelectasis. Electronically Signed   By: Valetta Mole M.D.   On: 09/06/2021 13:52   CT CHEST W CONTRAST  Result Date: 09/04/2021 CLINICAL  DATA:  Abdominal tightness for 1 month. Bulky abdominal adenopathy on recent CT. History of prostate cancer. Creatinine was obtained on site at [facility] at [address] Results: Creatinine 1.39 mg/dL. EXAM: CT CHEST WITH CONTRAST TECHNIQUE: Multidetector CT imaging of the chest was performed during intravenous contrast administration. CONTRAST:  82m ISOVUE-300 IOPAMIDOL (ISOVUE-300) INJECTION 61% COMPARISON:  Abdominopelvic CT 09/03/2021. Chest radiographs 03/06/2018. FINDINGS: Cardiovascular: Limited contrast opacification. Allowing for this, no acute vascular findings are identified. The heart size is normal. There is no pericardial effusion. Mediastinum/Nodes: There are no enlarged mediastinal, hilar or axillary lymph nodes. The thyroid gland, trachea and esophagus demonstrate no significant findings. Lungs/Pleura: Again demonstrated is moderate-sized dependent right pleural effusion, similar to recent abdominal CT. Trace pleural fluid on the left. Mild dependent atelectasis in both lungs. There is a subpleural part solid nodule in the superior segment of the left lower lobe, measuring 1.4 x 0.9 cm on image 68/8. No other pulmonary nodules are identified. Upper abdomen: Findings in the visualized upper abdomen are unchanged from yesterday CT. These include a heterogeneous soft tissue mass at the splenic hilum measuring 9.5 x 7.7 cm on image 119/2, a subcapsular mass posteriorly in the right hepatic lobe, suspected bilateral renal masses and extensive confluent retroperitoneal and mesenteric lymphadenopathy. There is a small amount of ascites. Musculoskeletal/Chest wall: There is no chest wall mass or suspicious osseous finding. Scattered bone islands, most conspicuous within the right 5th and 7th ribs. IMPRESSION: 1. No thoracic adenopathy identified. 2. Unchanged moderate right and trace left pleural effusions. 3. Nonspecific part solid nodule in the superior segment of the left lower lobe,  potentially inflammatory. No other suspicious pulmonary nodules. Attention on follow-up recommended. 4. Stable findings  in the upper abdomen from yesterday's CT, remaining suspicious for lymphoma. Electronically Signed   By: Richardean Sale M.D.   On: 09/04/2021 10:29   CT ABDOMEN PELVIS W CONTRAST  Result Date: 09/03/2021 CLINICAL DATA:  Abdominal pain and swelling for 1 month. Weight loss. Personal history of prostate carcinoma. EXAM: CT ABDOMEN AND PELVIS WITH CONTRAST TECHNIQUE: Multidetector CT imaging of the abdomen and pelvis was performed using the standard protocol following bolus administration of intravenous contrast. CONTRAST:  126m ISOVUE-370 IOPAMIDOL (ISOVUE-370) INJECTION 76% COMPARISON:  None. FINDINGS: Lower Chest: Moderate right pleural effusion and mild right lower lobe atelectasis. A tiny left pleural effusion is seen, and a pleural-based nodule is seen in the posterior left lower lobe which is incompletely visualized, but measures at least 12 mm. Hepatobiliary: A 6 x 3 cm low-attenuation lesion is seen in the posterior right hepatic lobe which shows evidence of peripheral nodular contrast enhancement a 2nd lesion is also seen in the anterior right hepatic lobe which measures 2.3 cm and shows signs of mild peripheral contrast enhancement. These lesions are not definitively characterized on this exam, but may represent hemangiomas. Gallbladder is unremarkable. No evidence of biliary ductal dilatation. Pancreas:  No mass or inflammatory changes. Spleen: Within normal limits in size and appearance. Bulky soft tissue mass is seen in the left subdiaphragmatic region which abuts the spleen, measuring 8.6 x 8.1 cm on image 16/2. Adrenals/Urinary Tract: Adrenal glands are normal in appearance. Several ill-defined low-attenuation masses are seen in both kidneys which are difficult to measure due to their poorly defined margins. No evidence of hydronephrosis. Stomach/Bowel: Encasement of bowel loops  by abnormal mesenteric soft tissue density is seen, however there is no evidence of bowel obstruction. Vascular/Lymphatic: Bulky abdominal lymphadenopathy is seen throughout the retroperitoneum and small bowel mesentery, which encases the mesenteric vessels, hilum, pancreas, and aorta and IVC. Mild retrocrural lymphadenopathy also noted. Mild pelvic lymphadenopathy is seen in bilateral iliac lymph node chains and pelvic mesentery. Misty soft tissue density is seen throughout the mesenteric and omental fat, and mild ascites is also demonstrated. Reproductive: Brachytherapy seeds are seen throughout the prostate bed. Other: Small bilateral inguinal hernias are seen, both containing only fat. Musculoskeletal: No suspicious bone lesions identified. Nonaggressive appearing chondrous bone lesion is seen in the left ilium. IMPRESSION: Bulky abdominal and pelvic lymphadenopathy, ill-defined bilateral renal masses, diffuse mesenteric and omental misty opacity, and mild ascites. These findings are highly suspicious for lymphoma, with metastatic disease considered less likely. Two indeterminate liver lesions, which may represent hemangiomas. Consider abdomen MRI without and with contrast or PET-CT for further evaluation. Moderate right pleural effusion and tiny left pleural effusion, and left lower lobe pleural-based pulmonary nodule measuring at least 12 mm. Lymphoma or metastatic disease cannot be excluded. These results will be called to the ordering clinician or representative by the Radiologist Assistant, and communication documented in the PACS or [employer] Electronically Signed   By: JMarlaine HindM.D.   On: 09/03/2021 08:43   [place]  Result Date: 09/09/2021 CLINICAL DATA:  Acute renal insufficiency. EXAM: RENAL / URINARY TRACT ULTRASOUND COMPLETE COMPARISON:  None FINDINGS: Right Kidney: Renal measurements: 12.5 x 6.5 x 6.9 cm = volume: 293 mL. Echogenicity within normal limits. No mass or hydronephrosis  visualized. Left Kidney: Renal measurements: 14.2 x 2.8 x 7.4 cm = volume: 453 mL. Echogenicity within normal limits. No mass or hydronephrosis visualized. Bladder: Not visualized. Other: There is a small perihepatic ascites. IMPRESSION: 1. Unremarkable kidneys. 2. Small  ascites. Electronically Signed   By: Anner Crete M.D.   On: 09/09/2021 19:48   [place] ASCITES (ABDOMEN LIMITED)  Result Date: 09/07/2021 CLINICAL DATA:  History of prostate cancer, now with bulky retroperitoneal and pelvic lymphadenopathy worrisome for lymphoma versus metastatic disease. Please perform image guided biopsy for tissue diagnostic purposes. EXAM: LIMITED ABDOMEN ULTRASOUND FOR ASCITES TECHNIQUE: Limited ultrasound survey for ascites was performed in all four abdominal quadrants. COMPARISON:  CT abdomen and pelvis-09/03/2021 FINDINGS: Sonographic evaluation of the abdomen demonstrates a trace amount of intra-abdominal ascites, too small to allow for safe ultrasound-guided paracentesis. No paracentesis attempted. IMPRESSION: Trace amount of intra-abdominal ascites, too small to allow for safe ultrasound-guided paracentesis. No paracentesis attempted. Electronically Signed   By: Sandi Mariscal M.D.   On: 09/07/2021 10:46   [place] CORE BIOPSY (LYMPH NODES)  Result Date: 09/10/2021 INDICATION: Extensive retroperitoneal adenopathy. Enlarged left inguinal lymph node. EXAM: ULTRASOUND GUIDED CORE BIOPSY OF LEFT INGUINAL ADENOPATHY MEDICATIONS: Lidocaine 1% subcutaneous ANESTHESIA/SEDATION: Intravenous Fentanyl 185mg and Versed 233mwere administered as conscious sedation during continuous monitoring of the patient's level of consciousness and physiological / cardiorespiratory status by the radiology RN, with a total moderate sedation time of 10 minutes. PROCEDURE: The procedure, risks, benefits, and alternatives were explained to the patient. Questions regarding the procedure were encouraged and answered. The patient understands and  consents to the procedure. Survey ultrasound of the left inguinal lesion performed. Adenopathy was localized and an appropriate skin entry site was determined and marked. The operative field was prepped with chlorhexidine in a sterile fashion, and a sterile drape was applied covering the operative field. A sterile gown and sterile gloves were used for the procedure. Local anesthesia was provided with 1% Lidocaine. Under real-time ultrasound guidance, multiple core biopsy samples of the left inguinal adenopathy obtained with the 10 cm 18 gauge automated biopsy device. These were submitted in saline to surgical pathology. Postprocedure scans show no hemorrhage or other apparent complication. The patient tolerated the procedure well. COMPLICATIONS: None immediate. FINDINGS: Left inguinal adenopathy was localized. Representative core biopsy samples obtained as above. IMPRESSION: 1. Technically successful ultrasound-guided core biopsy, left inguinal adenopathy. Electronically Signed   By: D Lucrezia Europe.D.   On: 09/10/2021 15:55   VAS US[place]OWER EXTREMITY VENOUS (DVT)  Result Date: 09/09/2021  Lower Venous DVT Study Patient Name:  Kenneth SAIKIDate of Exam:   09/07/2021 Medical Rec #: [id number]      Accession #:    [id number] of Birth: [birth year]       Patient Gender: M Patient Age:   [age]ears [facility]      VAS US[place]OWER EXTREMITY VENOUS (DVT) Referring Phys: FAAnnamaria BootsU --------------------------------------------------------------------------------  Indications: Edema, abdomen distention w/ ascites, history of prostate cancer.  Limitations: Body habitus and poor ultrasound/tissue interface. Comparison Study: No prior studies. Performing Technologist: RaDarlin CocoDMS, RVT  Examination Guidelines: A complete evaluation includes B-mode imaging, spectral Doppler, color Doppler, and power Doppler as needed of all accessible portions of each vessel. Bilateral testing is  considered an integral part of a complete examination. Limited examinations for reoccurring indications may be performed as noted. The reflux portion of the exam is performed with the patient in reverse Trendelenburg.  +---------+---------------+---------+-----------+----------+--------------+ RIGHT    CompressibilityPhasicitySpontaneityPropertiesThrombus Aging +---------+---------------+---------+-----------+----------+--------------+ CFV      Full           Yes      Yes                                 +---------+---------------+---------+-----------+----------+--------------+  SFJ      Full                                                        +---------+---------------+---------+-----------+----------+--------------+ FV Prox  Full                                                        +---------+---------------+---------+-----------+----------+--------------+ FV Mid   Full                                                        +---------+---------------+---------+-----------+----------+--------------+ FV DistalFull                                                        +---------+---------------+---------+-----------+----------+--------------+ PFV      Full                                                        +---------+---------------+---------+-----------+----------+--------------+ POP      Full           Yes      Yes                                 +---------+---------------+---------+-----------+----------+--------------+ PTV      Full                                                        +---------+---------------+---------+-----------+----------+--------------+ PERO     Full                                                        +---------+---------------+---------+-----------+----------+--------------+ Gastroc  Full                                                         +---------+---------------+---------+-----------+----------+--------------+   +---------+---------------+---------+-----------+----------+-------------------+ LEFT     CompressibilityPhasicitySpontaneityPropertiesThrombus Aging      +---------+---------------+---------+-----------+----------+-------------------+ CFV      Full           Yes      Yes                                      +---------+---------------+---------+-----------+----------+-------------------+  SFJ      Full                                                             +---------+---------------+---------+-----------+----------+-------------------+ FV Prox  Full                                                             +---------+---------------+---------+-----------+----------+-------------------+ FV Mid   Full                                                             +---------+---------------+---------+-----------+----------+-------------------+ FV DistalFull                                                             +---------+---------------+---------+-----------+----------+-------------------+ PFV      Full                                                             +---------+---------------+---------+-----------+----------+-------------------+ POP      Full                                                             +---------+---------------+---------+-----------+----------+-------------------+ PTV      Full                                                             +---------+---------------+---------+-----------+----------+-------------------+ PERO     Full                                         Not well visualized +---------+---------------+---------+-----------+----------+-------------------+ Gastroc  Full                                                             +---------+---------------+---------+-----------+----------+-------------------+      Summary: RIGHT: - There is no evidence of deep vein thrombosis in the lower extremity.  -  No cystic structure found in the popliteal fossa.  LEFT: - There is no evidence of deep vein thrombosis in the lower extremity. However, portions of this examination were limited- see technologist comments above.  - No cystic structure found in the popliteal fossa.  *See table(s) above for measurements and observations. Electronically signed by Orlie Pollen on 09/09/2021 at 10:17:25 AM.    Final    US THORACENTESIS ASP PLEURAL SPACE W/IMG GUIDE  Result Date: 09/07/2021 INDICATION: Shortness of breath. Right-sided pleural effusion. Request therapeutic and diagnostic thoracentesis. EXAM: ULTRASOUND GUIDED RIGHT THORACENTESIS MEDICATIONS: 1% plain lidocaine, 5 mL COMPLICATIONS: None immediate. PROCEDURE: An ultrasound guided thoracentesis was thoroughly discussed with the patient and questions answered. The benefits, risks, alternatives and complications were also discussed. The patient understands and wishes to proceed with the procedure. Written consent was obtained. Ultrasound was performed to localize and mark an adequate pocket of fluid in the right chest. The area was then prepped and draped in the normal sterile fashion. 1% Lidocaine was used for local anesthesia. Under ultrasound guidance a 6 Fr Safe-T-Centesis catheter was introduced. Thoracentesis was performed. The catheter was removed and a dressing applied. FINDINGS: A total of approximately 1.3 L of clear yellow fluid was removed. Samples were sent to the laboratory as requested by the clinical team. IMPRESSION: Successful ultrasound guided right thoracentesis yielding 1.3 L of pleural fluid. Read by: Ascencion Dike PA-C Electronically Signed   By: Sandi Mariscal M.D.   On: 09/07/2021 10:44    ASSESSMENT & PLAN DELOSS AMICO is a 60 y.o. male who presents for newly diagnosed T-cell lymphoma. Upon review of CT abdomen/pelvis on 09/03/2021, there is bulky  abdominal and pelvic lymphadenopathy. Left inguinal lymph node biopsy showed atypical lymphoid proliferation concerning for T-cell lymphoma.  Patient will need to complete staging studies with PET imaging. We are awaiting final pathologic IHC studies to finalize the chemotherapy regimen. Due to extent of bulky disease, patient will need chemotherapy as mainstay treatment. Likely treatment of choice will be CHOP regimen which includes cyclophosphamide 750 mg/m2 on day 1, doxorubicin 50 mg/m2 on day 1, vincristine 2 mg on day 1 and prednisone 100 mg PO daily on days 1-5. Treatment will be once every 3 weeks for approximately 6 cycles before obtaining restaging scans.   Patient will proceed with laboratory evaluation today to check CBC, CMP, LDH, phosphorus, uric acid levels. Additionally, we will obtain echocardiogram to assess baseline cardiac function.    #T-cell lymphoma, at least Stage II based on CT imaging: --Pending IHC studies --Need staging PET/CT scan  --Need echocardiogram to assess cardiac function --Labs today to check CBC, CMP,LDH, Phosphorus and Uric Acid levels. --Tentative plan to return in 2 weeks to start chemotherapy.   #Supportive Care --chemotherapy education to be scheduled --port placement to be scheduled. --zofran 40m q8H PRN and compazine 175mPO q6H for nausea -- allopurinol 30062mO daily for TLS prophylaxis -- EMLA cream for port -- Sent oxycodone 5 mg q 4-6 hours for abdominal pain.   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 Patient will be directly admitted for further management.    Orders Placed This Encounter  Procedures   NM PET Image Initial (PI) Skull Base To Thigh    Standing Status:   Future    Standing Expiration Date:   09/17/2022    Order Specific Question:   If indicated for the ordered procedure, I authorize  the administration of a  radiopharmaceutical per Radiology protocol    Answer:   Yes    Order Specific Question:   Preferred imaging location?    Answer:   [place]   IR IMAGING GUIDED PORT INSERTION    Standing Status:   Future    Standing Expiration Date:   09/17/2022    Order Specific Question:   Reason for Exam (SYMPTOM  OR DIAGNOSIS REQUIRED)    Answer:   port placement for chemotherapy    Order Specific Question:   Informed consent is required to be obtained if the patient has a GFR less than 51m/min (consider nephrology consult)    Answer:   Informed consent obtained    Order Specific Question:   [place]?    [place]   [facility]    Standing Status:   Future    Number of Occurrences:   1    Standing Expiration Date:   09/17/2022   CBC with Differential (Cancer Center Only)    Standing Status:   Future    Number of Occurrences:   1    Standing Expiration Date:   09/17/2022   Lactate dehydrogenase (LDH)    Standing Status:   Future    Number of Occurrences:   1    Standing Expiration Date:   09/17/2022   Uric acid    Standing Status:   Future    Number of Occurrences:   1    Standing Expiration Date:   09/17/2022   Phosphorus    Standing Status:   Future    Number of Occurrences:   1    Standing Expiration Date:   09/17/2022   ECHOCARDIOGRAM COMPLETE    Standing Status:   Future    Standing Expiration Date:   09/17/2022    Order Specific Question:   Where should this [place]    [place] [place]   Order Specific Question:   Perflutren DEFINITY (image enhancing agent) should be administered unless hypersensitivity or allergy exist    Answer:   Administer Perflutren    Order Specific Question:   Reason for exam-Echo    Answer:   Chemo  Z09    All questions were answered. The patient knows to call the clinic with any problems, questions or concerns.  I have spent a total of 60 minutes minutes of face-to-face and  non-face-to-face time, preparing to see the patient, obtaining and/or reviewing separately obtained history, performing a medically appropriate examination, counseling and educating the patient, ordering medications/tests/procedures,documenting clinical information in the electronic health record, and care coordination.   IDede Query PA-C [facility] [phone] Patient was seen with Dr. DLorenso Courier   I have read the above note and personally examined the patient. I agree with the assessment and plan as noted above.  Briefly Mr. HBrinkmeyeris a 60year old male who presents for evaluation of abdominal lymphadenopathy concerning for T-cell lymphoma.  Patient has abdominal distention and discomfort.  Pathology shows what appears to be a CD30 negative T-cell lymphoma with ALK status pending.  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  Uric acid is greater than 12, phosphorus is 6.3, potassium 5.0 with a creatinine of 4.23.  Given these findings were having him admitted to the inpatient service and administering 6 mg IV  rasburicase.  Additionally restart p.o. allopurinol 300 mg daily and consult to nephrology for further evaluation.  Given the complexity of this case we will need to reach out to [facility] to see if they would be able to transfer him for possible inpatient chemotherapy given the risk of continued tumor lysis syndrome and the unlikelihood that we would be able to start this treatment in the outpatient setting.   Ledell Peoples, MD Department of Hematology/Oncology [place] [facility] Phone: [phone] Pager: [phone] Email: [email or web address]

## 2021-09-18 ENCOUNTER — Inpatient Hospital Stay (HOSPITAL_COMMUNITY): Payer: 59

## 2021-09-18 DIAGNOSIS — N179 Acute kidney failure, unspecified: Secondary | ICD-10-CM | POA: Diagnosis not present

## 2021-09-18 DIAGNOSIS — G4733 Obstructive sleep apnea (adult) (pediatric): Secondary | ICD-10-CM | POA: Diagnosis not present

## 2021-09-18 DIAGNOSIS — R188 Other ascites: Secondary | ICD-10-CM | POA: Diagnosis not present

## 2021-09-18 DIAGNOSIS — C61 Malignant neoplasm of prostate: Secondary | ICD-10-CM | POA: Diagnosis not present

## 2021-09-18 LAB — COMPREHENSIVE METABOLIC PANEL
ALT: 24 U/L (ref 0–44)
AST: 19 U/L (ref 15–41)
Albumin: 3.5 g/dL (ref 3.5–5.0)
Alkaline Phosphatase: 58 U/L (ref 38–126)
Anion gap: 12 (ref 5–15)
BUN: 43 mg/dL — ABNORMAL HIGH (ref 6–20)
CO2: 20 mmol/L — ABNORMAL LOW (ref 22–32)
Calcium: 8.6 mg/dL — ABNORMAL LOW (ref 8.9–10.3)
Chloride: 104 mmol/L (ref 98–111)
Creatinine, Ser: 4.37 mg/dL — ABNORMAL HIGH (ref 0.61–1.24)
GFR, Estimated: 15 mL/min — ABNORMAL LOW (ref >60)
Glucose, Bld: 90 mg/dL (ref 70–99)
Potassium: 4.5 mmol/L (ref 3.5–5.1)
Sodium: 136 mmol/L (ref 135–145)
Total Bilirubin: 0.7 mg/dL (ref 0.3–1.2)
Total Protein: 6.6 g/dL (ref 6.5–8.1)

## 2021-09-18 LAB — CBC
HCT: 36.4 % — ABNORMAL LOW (ref 39.0–52.0)
Hemoglobin: 11.5 g/dL — ABNORMAL LOW (ref 13.0–17.0)
MCH: 27.2 pg (ref 26.0–34.0)
MCHC: 31.6 g/dL (ref 30.0–36.0)
MCV: 86.1 fL (ref 80.0–100.0)
Platelets: 336 10*3/uL (ref 150–400)
RBC: 4.23 MIL/uL (ref 4.22–5.81)
RDW: 13.7 % (ref 11.5–15.5)
WBC: 8.9 10*3/uL (ref 4.0–10.5)
nRBC: 0 % (ref 0.0–0.2)

## 2021-09-18 LAB — PROTIME-INR
INR: 1.2 (ref 0.8–1.2)
Prothrombin Time: 15.5 seconds — ABNORMAL HIGH (ref 11.4–15.2)

## 2021-09-18 MED ORDER — SODIUM CHLORIDE 0.9 % IV SOLN: INTRAVENOUS | Status: AC

## 2021-09-18 MED ORDER — DIPHENHYDRAMINE HCL 25 MG PO CAPS
25.0000 mg | ORAL_CAPSULE | Freq: Four times a day (QID) | ORAL | Status: DC | PRN
  Administered 2021-09-18 – 2021-09-19 (×2): 25 mg via ORAL
  Filled 2021-09-18 (×2): qty 1

## 2021-09-18 NOTE — Progress Notes (Signed)
[place] [phone]) [phone]   [phone]) [phone]  PROGRESS NOTE  Patient Care Team: Deland Pretty, MD as PCP - General (Internal Medicine)  Hematological/Oncological History # T Cell Lymphoma  1) Initially presented with worsening abdominal distention and shortness of breath x 4 weeks  2) 09/03/2021: CT abdomen/pelvis: Bulky abdominal and pelvic lymphadenopathy, ill-defined bilateral renal masses, diffuse mesenteric and omental misty opacity and mild ascites.  2 indeterminate liver lesions, may represent hemangiomas.  Moderate right pleural effusion and tiny left pleural effusion.  Left lower lobe pleural-based pulmonary nodule measuring at least 12 mm   3) 09/04/2021:CT chest: No thoracic adenopathy identified.  Unchanged moderate right and trace left pleural effusions.  Nonspecific part solid nodule in the superior segment of the left lower lobe, potentially inflammatory.   4) 09/07/2021: Underwent right thoracentesis removing 1.3 L of pleural fluid.  Cytology revealed reactive mesothelial cells and numerous lymphoid cells. Findings favored to be reactive in nature.   5) 09/10/2021: US guided left inguinal lymph node core biopsy.  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   6) 09/17/2021: Established care with [facility] Hematology/On[facility]  Clinical Summary: Kathrynn Speed 60 y.o. 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  Interval History:  --received rasburicase 6 mg IV last night --started on allopurinol $RemoveBefore'300mg'hGhWJBuYqoBWZ$  PO daily --Cr 4.37, increased from 4.23 on admission --no f/c/s overnight. Patient developed no new symptoms.   MEDICAL HISTORY:  Past Medical History:  Diagnosis Date   Ascites    Asthma as child   hx of   Cancer [facility])    prostate - psa 3.93 on 07/06/2012   GERD (gastroesophageal  reflux disease)    hx of   H/O hiatal hernia 15 years ago   Hypertension    Prostate cancer ([place]) 08/09/2012   Gleason 3+3=6, vol 20-25 gm   Prostate cancer ([place]) 01/10/2013   Seed implant to be done due to Robotic surgery aborted   Sleep apnea    CPAP    SURGICAL HISTORY: Past Surgical History:  Procedure Laterality Date   CARPAL TUNNEL RELEASE Right 04/03/2021   Procedure: RIGHT CARPAL TUNNEL RELEASE;  Surgeon: Leandrew Koyanagi, MD;  Location: [place];  Service: Orthopedics;  Laterality: Right;   HERNIA REPAIR  as child   KNEE SURGERY Left as child   tore a ligament    MASS EXCISION Right 03/07/2021   Procedure: EXCISION MASS OF RIGHT CHEST;  Surgeon: Johnathan Hausen, MD;  Location: [facility];  Service: General;  Laterality: Right;   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   RADIOACTIVE SEED IMPLANT N/A 01/17/2013   Procedure: RADIOACTIVE SEED IMPLANT;  Surgeon: Bernestine Amass, MD;  Location: [facility];  Service: Urology;  Laterality: N/A;  W/ MURRAY    ROBOT ASSISTED LAPAROSCOPIC RADICAL PROSTATECTOMY N/A 11/24/2012   Procedure: Attempted ROBOTIC ASSISTED LAPAROSCOPIC RADICAL PROSTATECTOMY, Abandoned;  Surgeon: Bernestine Amass, MD;  Location: [facility];  Service: Urology;  Laterality: N/A;   scrotum explotion Left    SHOULDER ARTHROSCOPY WITH ROTATOR CUFF REPAIR AND SUBACROMIAL DECOMPRESSION Right 04/03/2021   Procedure: RIGHT SHOULDER ARTHROSCOPY ROTATOR CUFF REPAIR, SUBACROMIAL DECOMPRESSION, EXTENSIVE DEBRIDEMENT;  Surgeon: Leandrew Koyanagi, MD;  Location: [place];  Service: Orthopedics;  Laterality: Right;   testical growth removed     VASECTOMY      SOCIAL HISTORY: Social History   Socioeconomic History  Marital status: Widowed    Spouse name: Not on file   Number of children: 2   Years of education: Not on file   Highest education level: Not on file  Occupational History   Occupation: TRUCK DRIVER     Employer: [employer]  Tobacco Use   Smoking status: Never   Smokeless  tobacco: Never  Vaping Use   Vaping Use: Never used  Substance and Sexual Activity   Alcohol use: No   Drug use: No   Sexual activity: Yes  Other Topics Concern   Not on file  Social History Narrative   Not on file   Social Determinants of Health   Financial Resource Strain: Not on file  Food Insecurity: Not on file  Transportation Needs: Not on file  Physical Activity: Not on file  Stress: Not on file  Social Connections: Not on file  Intimate Partner Violence: Not on file    FAMILY HISTORY: Family History  Problem Relation Age of Onset   Cancer Maternal Aunt        colon   Cancer Paternal Uncle        prostate, 2 uncles   Cancer Daughter        angiosarcoma age 73   Prostate cancer Other     ALLERGIES:  has No Known Allergies.  MEDICATIONS:  Current Facility-Administered Medications  Medication Dose Route Frequency Provider Last Rate Last Admin   0.9 %  sodium chloride infusion   Intravenous Continuous Pokhrel, Laxman, MD 150 mL/hr at 09/18/21 1455 New Bag at 09/18/21 1455   acetaminophen (TYLENOL) tablet 650 mg  650 mg Oral Q6H PRN Pokhrel, Laxman, MD       Or   acetaminophen (TYLENOL) suppository 650 mg  650 mg Rectal Q6H PRN Pokhrel, Laxman, MD       albuterol (PROVENTIL) (2.5 MG/3ML) 0.083% nebulizer solution 2.5 mg  2.5 mg Nebulization Q6H PRN Dimple Nanas, RPH       allopurinol (ZYLOPRIM) tablet 300 mg  300 mg Oral Daily Pokhrel, Laxman, MD   300 mg at 09/18/21 1018   heparin injection 5,000 Units  5,000 Units Subcutaneous Q8H Pokhrel, Laxman, MD   5,000 Units at 09/18/21 1453   ondansetron (ZOFRAN) tablet 4 mg  4 mg Oral Q6H PRN Pokhrel, Laxman, MD       Or   ondansetron (ZOFRAN) injection 4 mg  4 mg Intravenous Q6H PRN Pokhrel, Laxman, MD       oxyCODONE (Oxy IR/ROXICODONE) immediate release tablet 5 mg  5 mg Oral Q6H PRN Pokhrel, Laxman, MD   5 mg at 09/18/21 1457    REVIEW OF SYSTEMS:   Constitutional: ( - ) fevers, ( - )  chills , ( - ) night  sweats Eyes: ( - ) blurriness of vision, ( - ) double vision, ( - ) watery eyes Ears, nose, mouth, throat, and face: ( - ) mucositis, ( - ) sore throat Respiratory: ( - ) cough, ( - ) dyspnea, ( - ) wheezes Cardiovascular: ( - ) palpitation, ( - ) chest discomfort, ( - ) lower extremity swelling Gastrointestinal:  ( - ) nausea, ( - ) heartburn, ( - ) change in bowel habits Skin: ( - ) abnormal skin rashes Lymphatics: ( - ) new lymphadenopathy, ( - ) easy bruising Neurological: ( - ) numbness, ( - ) tingling, ( - ) new weaknesses Behavioral/Psych: ( - ) mood change, ( - ) new changes  All other systems  were reviewed with the patient and are negative.  PHYSICAL EXAMINATION: ECOG PERFORMANCE STATUS: 1 - Symptomatic but completely ambulatory  Vitals:   09/18/21 0424 09/18/21 1429  BP: (!) 137/95 (!) 145/89  Pulse: (!) 103 (!) 105  Resp: 20 18  Temp: 98.2 F (36.8 C) 98.4 F (36.9 C)  SpO2: 97% 96%   Filed Weights   09/17/21 1831 09/18/21 0424  Weight: (!) 324 lb 1.2 oz (147 kg) (!) 330 lb 11 oz (150 kg)    GENERAL: well appearing middle aged [place] American male, alert, no distress and comfortable SKIN: skin color, texture, turgor are normal, no rashes or significant lesions EYES: conjunctiva are pink and non-injected, sclera clear LUNGS: clear to auscultation and percussion with normal breathing effort HEART: regular rate & rhythm and no murmurs and no lower extremity edema ABDOMEN: distended, but non-tender with normal bowel sounds Musculoskeletal: no cyanosis of digits and no clubbing  PSYCH: alert & oriented x 3, fluent speech NEURO: no focal motor/sensory deficits  LABORATORY DATA:  I have reviewed the data as listed CBC Latest Ref Rng & Units 09/18/2021 09/17/2021 09/08/2021  WBC 4.0 - 10.5 K/uL 8.9 8.2 6.3  Hemoglobin 13.0 - 17.0 g/dL 11.5(L) 11.9(L) 11.9(L)  Hematocrit 39.0 - 52.0 % 36.4(L) 35.2(L) 36.6(L)  Platelets 150 - 400 K/uL 336 364 361    CMP Latest Ref Rng  & Units 09/18/2021 09/17/2021 09/10/2021  Glucose 70 - 99 mg/dL 90 96 103(H)  BUN 6 - 20 mg/dL 43(H) 41(H) 43(H)  Creatinine 0.61 - 1.24 mg/dL 4.37(H) 4.23(HH) 2.64(H)  Sodium 135 - 145 mmol/L 136 142 136  Potassium 3.5 - 5.1 mmol/L 4.5 5.0 4.0  Chloride 98 - 111 mmol/L 104 108 103  CO2 22 - 32 mmol/L 20(L) 20(L) 23  Calcium 8.9 - 10.3 mg/dL 8.6(L) 9.5 8.9  Total Protein 6.5 - 8.1 g/dL 6.6 6.8 -  Total Bilirubin 0.3 - 1.2 mg/dL 0.7 0.5 -  Alkaline Phos 38 - 126 U/L 58 71 -  AST 15 - 41 U/L 19 19 -  ALT 0 - 44 U/L 24 27 -    RADIOGRAPHIC STUDIES: DG Chest 1 View  Result Date: 09/07/2021 CLINICAL DATA:  Right thoracentesis EXAM: CHEST  1 VIEW COMPARISON:  Yesterday FINDINGS: Normal heart size and mediastinal contours. No acute infiltrate or edema. No effusion or pneumothorax. No acute osseous findings. IMPRESSION: No evidence of thoracentesis complication. No visible residual fluid. Electronically Signed   By: Jorje Guild M.D.   On: 09/07/2021 09:19   DG Chest 2 View  Result Date: 09/06/2021 CLINICAL DATA:  Shortness of breath EXAM: CHEST - 2 VIEW COMPARISON:  Chest radiograph 03/06/2018, CT chest 09/04/2021 FINDINGS: The cardiomediastinal silhouette is within normal limits. There is a moderate size right pleural effusion with adjacent opacity in the right base likely reflecting atelectasis. The right upper lung is well-aerated. There is a smaller left pleural effusion. The left lung is otherwise clear. There is no pneumothorax. There is no acute osseous abnormality. IMPRESSION: Moderate right and small left pleural effusions with adjacent right basilar atelectasis. Electronically Signed   By: Valetta Mole M.D.   On: 09/06/2021 13:52   CT CHEST W CONTRAST  Result Date: 09/04/2021 CLINICAL DATA:  Abdominal tightness for 1 month. Bulky abdominal adenopathy on recent CT. History of prostate cancer. Creatinine was obtained on site at [facility] at [address] Results:  Creatinine 1.39 mg/dL. EXAM: CT CHEST WITH CONTRAST TECHNIQUE: Multidetector CT imaging of the  chest was performed during intravenous contrast administration. CONTRAST:  30mL ISOVUE-300 IOPAMIDOL (ISOVUE-300) INJECTION 61% COMPARISON:  Abdominopelvic CT 09/03/2021. Chest radiographs 03/06/2018. FINDINGS: Cardiovascular: Limited contrast opacification. Allowing for this, no acute vascular findings are identified. The heart size is normal. There is no pericardial effusion. Mediastinum/Nodes: There are no enlarged mediastinal, hilar or axillary lymph nodes. The thyroid gland, trachea and esophagus demonstrate no significant findings. Lungs/Pleura: Again demonstrated is moderate-sized dependent right pleural effusion, similar to recent abdominal CT. Trace pleural fluid on the left. Mild dependent atelectasis in both lungs. There is a subpleural part solid nodule in the superior segment of the left lower lobe, measuring 1.4 x 0.9 cm on image 68/8. No other pulmonary nodules are identified. Upper abdomen: Findings in the visualized upper abdomen are unchanged from yesterday CT. These include a heterogeneous soft tissue mass at the splenic hilum measuring 9.5 x 7.7 cm on image 119/2, a subcapsular mass posteriorly in the right hepatic lobe, suspected bilateral renal masses and extensive confluent retroperitoneal and mesenteric lymphadenopathy. There is a small amount of ascites. Musculoskeletal/Chest wall: There is no chest wall mass or suspicious osseous finding. Scattered bone islands, most conspicuous within the right 5th and 7th ribs. IMPRESSION: 1. No thoracic adenopathy identified. 2. Unchanged moderate right and trace left pleural effusions. 3. Nonspecific part solid nodule in the superior segment of the left lower lobe, potentially inflammatory. No other suspicious pulmonary nodules. Attention on follow-up recommended. 4. Stable findings in the upper abdomen from yesterday's CT, remaining suspicious for lymphoma.  Electronically Signed   By: Richardean Sale M.D.   On: 09/04/2021 10:29   CT ABDOMEN PELVIS W CONTRAST  Result Date: 09/03/2021 CLINICAL DATA:  Abdominal pain and swelling for 1 month. Weight loss. Personal history of prostate carcinoma. EXAM: CT ABDOMEN AND PELVIS WITH CONTRAST TECHNIQUE: Multidetector CT imaging of the abdomen and pelvis was performed using the standard protocol following bolus administration of intravenous contrast. CONTRAST:  158mL ISOVUE-370 IOPAMIDOL (ISOVUE-370) INJECTION 76% COMPARISON:  None. FINDINGS: Lower Chest: Moderate right pleural effusion and mild right lower lobe atelectasis. A tiny left pleural effusion is seen, and a pleural-based nodule is seen in the posterior left lower lobe which is incompletely visualized, but measures at least 12 mm. Hepatobiliary: A 6 x 3 cm low-attenuation lesion is seen in the posterior right hepatic lobe which shows evidence of peripheral nodular contrast enhancement a 2nd lesion is also seen in the anterior right hepatic lobe which measures 2.3 cm and shows signs of mild peripheral contrast enhancement. These lesions are not definitively characterized on this exam, but may represent hemangiomas. Gallbladder is unremarkable. No evidence of biliary ductal dilatation. Pancreas:  No mass or inflammatory changes. Spleen: Within normal limits in size and appearance. Bulky soft tissue mass is seen in the left subdiaphragmatic region which abuts the spleen, measuring 8.6 x 8.1 cm on image 16/2. Adrenals/Urinary Tract: Adrenal glands are normal in appearance. Several ill-defined low-attenuation masses are seen in both kidneys which are difficult to measure due to their poorly defined margins. No evidence of hydronephrosis. Stomach/Bowel: Encasement of bowel loops by abnormal mesenteric soft tissue density is seen, however there is no evidence of bowel obstruction. Vascular/Lymphatic: Bulky abdominal lymphadenopathy is seen throughout the retroperitoneum  and small bowel mesentery, which encases the mesenteric vessels, hilum, pancreas, and aorta and IVC. Mild retrocrural lymphadenopathy also noted. Mild pelvic lymphadenopathy is seen in bilateral iliac lymph node chains and pelvic mesentery. Misty soft tissue density is seen throughout the mesenteric and  omental fat, and mild ascites is also demonstrated. Reproductive: Brachytherapy seeds are seen throughout the prostate bed. Other: Small bilateral inguinal hernias are seen, both containing only fat. Musculoskeletal: No suspicious bone lesions identified. Nonaggressive appearing chondrous bone lesion is seen in the left ilium. IMPRESSION: Bulky abdominal and pelvic lymphadenopathy, ill-defined bilateral renal masses, diffuse mesenteric and omental misty opacity, and mild ascites. These findings are highly suspicious for lymphoma, with metastatic disease considered less likely. Two indeterminate liver lesions, which may represent hemangiomas. Consider abdomen MRI without and with contrast or PET-CT for further evaluation. Moderate right pleural effusion and tiny left pleural effusion, and left lower lobe pleural-based pulmonary nodule measuring at least 12 mm. Lymphoma or metastatic disease cannot be excluded. These results will be called to the ordering clinician or representative by the Radiologist Assistant, and communication documented in the PACS or [employer]. Electronically Signed   By: Marlaine Hind M.D.   On: 09/03/2021 08:43   US RENAL  Result Date: 09/17/2021 CLINICAL DATA:  Acute kidney injury EXAM: RENAL / URINARY TRACT ULTRASOUND COMPLETE COMPARISON:  09/09/2021 ultrasound FINDINGS: Right Kidney: Renal measurements: 13 x 7.6 x 7.5 cm = volume: 384.4 mL. Echogenicity within normal limits. No mass or hydronephrosis visualized. Left Kidney: Renal measurements: 13.3 x 8.8 x 8.6 cm = volume: 511.9 mL. Echogenicity within normal limits. No mass or hydronephrosis visualized. Bladder: Urinary bladder is  empty. Other: Small amount of abdominal ascites IMPRESSION: 1. Generous size kidneys which are otherwise normal. 2. Small amount of ascites. Electronically Signed   By: Donavan Foil M.D.   On: 09/17/2021 18:36   US RENAL  Result Date: 09/09/2021 CLINICAL DATA:  Acute renal insufficiency. EXAM: RENAL / URINARY TRACT ULTRASOUND COMPLETE COMPARISON:  None FINDINGS: Right Kidney: Renal measurements: 12.5 x 6.5 x 6.9 cm = volume: 293 mL. Echogenicity within normal limits. No mass or hydronephrosis visualized. Left Kidney: Renal measurements: 14.2 x 2.8 x 7.4 cm = volume: 453 mL. Echogenicity within normal limits. No mass or hydronephrosis visualized. Bladder: Not visualized. Other: There is a small perihepatic ascites. IMPRESSION: 1. Unremarkable kidneys. 2. Small ascites. Electronically Signed   By: Anner Crete M.D.   On: 09/09/2021 19:48   [place] ASCITES (ABDOMEN LIMITED)  Result Date: 09/18/2021 CLINICAL DATA:  Abdominal ascites.  Eval for paracentesis. EXAM: LIMITED ABDOMEN ULTRASOUND FOR ASCITES TECHNIQUE: Limited ultrasound survey for ascites was performed in all four abdominal quadrants. COMPARISON:  CT AP, 09/03/2021.  US Renal, 09/17/2021. FINDINGS: Focused sonographic evaluation of the abdomen at the RIGHT upper, LEFT upper, RIGHT lower and LEFT lower quadrants. Small volume ascites is appreciated, without a large enough volume nor safe window for paracentesis Paracentesis was performed. IMPRESSION: Small volume ascites.  No safe window for paracentesis. This examination was performed by: Soyla Dryer, NP IR. Electronically Signed   By: Michaelle Birks M.D.   On: 09/18/2021 15:24   [place] ASCITES (ABDOMEN LIMITED)  Result Date: 09/07/2021 CLINICAL DATA:  History of prostate cancer, now with bulky retroperitoneal and pelvic lymphadenopathy worrisome for lymphoma versus metastatic disease. Please perform image guided biopsy for tissue diagnostic purposes. EXAM: LIMITED ABDOMEN ULTRASOUND FOR ASCITES  TECHNIQUE: Limited ultrasound survey for ascites was performed in all four abdominal quadrants. COMPARISON:  CT abdomen and pelvis-09/03/2021 FINDINGS: Sonographic evaluation of the abdomen demonstrates a trace amount of intra-abdominal ascites, too small to allow for safe ultrasound-guided paracentesis. No paracentesis attempted. IMPRESSION: Trace amount of intra-abdominal ascites, too small to allow for safe ultrasound-guided paracentesis. No paracentesis attempted.  Electronically Signed   By: Sandi Mariscal M.D.   On: 09/07/2021 10:46   [place] CORE BIOPSY (LYMPH NODES)  Result Date: 09/10/2021 INDICATION: Extensive retroperitoneal adenopathy. Enlarged left inguinal lymph node. EXAM: ULTRASOUND GUIDED CORE BIOPSY OF LEFT INGUINAL ADENOPATHY MEDICATIONS: Lidocaine 1% subcutaneous ANESTHESIA/SEDATION: Intravenous Fentanyl 134mcg and Versed $RemoveBe'2mg'urzHjpceZ$  were administered as conscious sedation during continuous monitoring of the patient's level of consciousness and physiological / cardiorespiratory status by the radiology RN, with a total moderate sedation time of 10 minutes. PROCEDURE: The procedure, risks, benefits, and alternatives were explained to the patient. Questions regarding the procedure were encouraged and answered. The patient understands and consents to the procedure. Survey ultrasound of the left inguinal lesion performed. Adenopathy was localized and an appropriate skin entry site was determined and marked. The operative field was prepped with chlorhexidine in a sterile fashion, and a sterile drape was applied covering the operative field. A sterile gown and sterile gloves were used for the procedure. Local anesthesia was provided with 1% Lidocaine. Under real-time ultrasound guidance, multiple core biopsy samples of the left inguinal adenopathy obtained with the 10 cm 18 gauge automated biopsy device. These were submitted in saline to surgical pathology. Postprocedure scans show no hemorrhage or other apparent  complication. The patient tolerated the procedure well. COMPLICATIONS: None immediate. FINDINGS: Left inguinal adenopathy was localized. Representative core biopsy samples obtained as above. IMPRESSION: 1. Technically successful ultrasound-guided core biopsy, left inguinal adenopathy. Electronically Signed   By: Lucrezia Europe M.D.   On: 09/10/2021 15:55   VAS [place] LOWER EXTREMITY VENOUS (DVT)  Result Date: 09/09/2021  Lower Venous DVT Study Patient Name:  Kenneth Wiggins  Date of Exam:   09/07/2021 Medical Rec #: [id number]        Accession #:    [id number] Date of Birth: 11/23/1960         Patient Gender: M Patient Age:   60 years Exam Location:  [facility] Procedure:      VAS [place] LOWER EXTREMITY VENOUS (DVT) Referring Phys: Annamaria Boots XU --------------------------------------------------------------------------------  Indications: Edema, abdomen distention w/ ascites, history of prostate cancer.  Limitations: Body habitus and poor ultrasound/tissue interface. Comparison Study: No prior studies. Performing Technologist: Darlin Coco RDMS, RVT  Examination Guidelines: A complete evaluation includes B-mode imaging, spectral Doppler, color Doppler, and power Doppler as needed of all accessible portions of each vessel. Bilateral testing is considered an integral part of a complete examination. Limited examinations for reoccurring indications may be performed as noted. The reflux portion of the exam is performed with the patient in reverse Trendelenburg.  +---------+---------------+---------+-----------+----------+--------------+ RIGHT    CompressibilityPhasicitySpontaneityPropertiesThrombus Aging +---------+---------------+---------+-----------+----------+--------------+ CFV      Full           Yes      Yes                                 +---------+---------------+---------+-----------+----------+--------------+ SFJ      Full                                                         +---------+---------------+---------+-----------+----------+--------------+ FV Prox  Full                                                        +---------+---------------+---------+-----------+----------+--------------+  FV Mid   Full                                                        +---------+---------------+---------+-----------+----------+--------------+ FV DistalFull                                                        +---------+---------------+---------+-----------+----------+--------------+ PFV      Full                                                        +---------+---------------+---------+-----------+----------+--------------+ POP      Full           Yes      Yes                                 +---------+---------------+---------+-----------+----------+--------------+ PTV      Full                                                        +---------+---------------+---------+-----------+----------+--------------+ PERO     Full                                                        +---------+---------------+---------+-----------+----------+--------------+ Gastroc  Full                                                        +---------+---------------+---------+-----------+----------+--------------+   +---------+---------------+---------+-----------+----------+-------------------+ LEFT     CompressibilityPhasicitySpontaneityPropertiesThrombus Aging      +---------+---------------+---------+-----------+----------+-------------------+ CFV      Full           Yes      Yes                                      +---------+---------------+---------+-----------+----------+-------------------+ SFJ      Full                                                             +---------+---------------+---------+-----------+----------+-------------------+ FV Prox  Full                                                              +---------+---------------+---------+-----------+----------+-------------------+  FV Mid   Full                                                             +---------+---------------+---------+-----------+----------+-------------------+ FV DistalFull                                                             +---------+---------------+---------+-----------+----------+-------------------+ PFV      Full                                                             +---------+---------------+---------+-----------+----------+-------------------+ POP      Full                                                             +---------+---------------+---------+-----------+----------+-------------------+ PTV      Full                                                             +---------+---------------+---------+-----------+----------+-------------------+ PERO     Full                                         Not well visualized +---------+---------------+---------+-----------+----------+-------------------+ Gastroc  Full                                                             +---------+---------------+---------+-----------+----------+-------------------+     Summary: RIGHT: - There is no evidence of deep vein thrombosis in the lower extremity.  - No cystic structure found in the popliteal fossa.  LEFT: - There is no evidence of deep vein thrombosis in the lower extremity. However, portions of this examination were limited- see technologist comments above.  - No cystic structure found in the popliteal fossa.  *See table(s) above for measurements and observations. Electronically signed by Orlie Pollen on 09/09/2021 at 10:17:25 AM.    Final    US THORACENTESIS ASP PLEURAL SPACE W/IMG GUIDE  Result Date: 09/07/2021 INDICATION: Shortness of breath. Right-sided pleural effusion. Request therapeutic and diagnostic thoracentesis. EXAM: ULTRASOUND GUIDED RIGHT THORACENTESIS  MEDICATIONS: 1% plain lidocaine, 5 mL COMPLICATIONS: None immediate. PROCEDURE: An ultrasound guided thoracentesis was thoroughly discussed with the patient and questions answered. The benefits, risks, alternatives and complications were also discussed. The patient  understands and wishes to proceed with the procedure. Written consent was obtained. Ultrasound was performed to localize and mark an adequate pocket of fluid in the right chest. The area was then prepped and draped in the normal sterile fashion. 1% Lidocaine was used for local anesthesia. Under ultrasound guidance a 6 Fr Safe-T-Centesis catheter was introduced. Thoracentesis was performed. The catheter was removed and a dressing applied. FINDINGS: A total of approximately 1.3 L of clear yellow fluid was removed. Samples were sent to the laboratory as requested by the clinical team. IMPRESSION: Successful ultrasound guided right thoracentesis yielding 1.3 L of pleural fluid. Read by: Ascencion Dike PA-C Electronically Signed   By: Sandi Mariscal M.D.   On: 09/07/2021 10:44    ASSESSMENT & PLAN TIDUS UPCHURCH 60 y.o. 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  #Concern for Tumor Lysis Syndrome --Creatinine elevated to 4.37 today, up from 4.23 in clinic yesterday.  Phosphorus noted to be 6.3 yesterday --Uric acid 2.3 today, administered rasburicase 6 mg IV on 09/17/2021 --Continue allopurinol 300 mg p.o. daily --Continue with aggressive hydration  #T Cell Lymphoma -- Diagnosed by core biopsy of inguinal lymph node --Noted to be CD30 negative, ALK status pending --Treatment to begin as soon as is feasible, though given the complexity of this case adenopathy will be able to treat this in the outpatient setting --We will discuss with [facility] to see if they will accept the transfer to consider inpatient  administration of chemotherapy and close monitoring  #Acute Kidney Injury, worsening -- Ultrasound of the kidneys revealed no hydronephrosis, findings are not due to pressure on the kidneys or ureters --Likely due to tumor lysis syndrome. --Recommend consult to nephrology --Continue aggressive hydration --Tumor lysis syndrome management as above.   All questions were answered. The patient knows to call the clinic with any problems, questions or concerns.  A total of more than 25 minutes were spent on this encounter with face-to-face time and non-face-to-face time, including preparing to see the patient, ordering tests and/or medications, counseling the patient and coordination of care as outlined above.   Ledell Peoples, MD Department of Hematology/Oncology [place] at [facility] Phone: [phone] Pager: [phone] Email: [email or web address]  09/18/2021 5:20 PM

## 2021-09-18 NOTE — Progress Notes (Signed)
PROGRESS NOTE    Kenneth Wiggins  EQA:[id number] DOB: 01-31-1961 DOA: 09/17/2021 PCP: Deland Pretty, MD    Brief Narrative:  60 year old male, with history of hypertension, obstructive sleep apnea, prior prostate cancer, recent diagnosis of lymphoma been admitted to the hospital with worsening renal failure, felt to be related to tumor lysis syndrome.  Oncology and nephrology following patient.  He is continued on IV fluids.   Assessment & Plan:   Principal Problem:   AKI (acute kidney injury) ([place]) Active Problems:   Prostate cancer ([place])   Ascites   OSA (obstructive sleep apnea)   Lymphadenopathy   Acute kidney injury on chronic kidney disease stage II -Baseline creatinine around 1.3 -Recently discharged from the hospital on 11/29 at that point his creatinine was 2.6 -Upon readmission, creatinine noted to be elevated at 4.2 -Concerned that his worsening renal failure related to tumor lysis syndrome -He has been hydrated with IV fluids, reports fair urine output, unfortunately I's and O's have not been accurately recorded -I have placed order for strict intake and output -Imaging does not indicate any hydronephrosis -He does report frequent stools, but also says that at baseline he has 2-3 bowel movements daily -Continue with hydration -Nephrology consulted  Tumor lysis syndrome -Oncology following -Received rasburicase 12/6 -Currently on allopurinol  T-cell lymphoma -Oncology following -Treatment to begin as soon as feasible  Ascites -Patient underwent ultrasound, does not appear to have significant collection of fluid for paracentesis -He does continue to complain of abdominal distention -He has been told that based on recent CT imaging, distention likely related to bulky adenopathy related to lymphoma, since there is not appear to be a significant volume of ascites.  Right pleural effusion -Thoracentesis on his last admission -We will repeat chest x-ray today  since he does complain of shortness of breath to evaluate for recurrent effusion  Bilateral chronic lower extremity pitting edema -Reports that he has chronic edema which is currently not far off from his baseline  Obstructive sleep apnea Continue CPAP nightly  DVT prophylaxis: heparin injection 5,000 Units Start: 09/17/21 2200  Code Status: full code Family Communication: discussed with sister at bedside Disposition Plan: Status is: Inpatient  Remains inpatient appropriate because: worsening renal failure         Consultants:  Oncology nephrology  Procedures:    Antimicrobials:      Subjective: Continues to feel tightness in abdomen. Has difficulty sleeping due to discomfort from this. Feels short of breath on exertion, currently on room air. Reports good urine output.   Objective: Vitals:   09/18/21 0023 09/18/21 0424 09/18/21 1429 09/18/21 2010  BP: (!) 143/86 (!) 137/95 (!) 145/89   Pulse: (!) 109 (!) 103 (!) 105   Resp: 20 20 18  (!) 22  Temp: 98.5 F (36.9 C) 98.2 F (36.8 C) 98.4 F (36.9 C)   TempSrc: Oral Oral Oral   SpO2: 95% 97% 96%   Weight:  (!) 150 kg    Height:        Intake/Output Summary (Last 24 hours) at 09/18/2021 2050 Last data filed at 09/18/2021 1500 Gross per 24 hour  Intake 2998.25 ml  Output --  Net 2998.25 ml   Filed Weights   09/17/21 1831 09/18/21 0424  Weight: (!) 147 kg (!) 150 kg    Examination:  General exam: Appears calm and comfortable  Respiratory system: diminished breath sounds at right base. Respiratory effort normal. Cardiovascular system: S1 & S2 heard, RRR. No JVD, murmurs, rubs, gallops  or clicks.  Gastrointestinal system: Abdomen is distended, soft and nontender. No organomegaly or masses felt. Normal bowel sounds heard. Central nervous system: Alert and oriented. No focal neurological deficits. Extremities: 1+ pitting edema bilaterally Skin: No rashes, lesions or ulcers Psychiatry: Judgement and  insight appear normal. Mood & affect appropriate.     Data Reviewed: I have personally reviewed following labs and imaging studies  CBC: Recent Labs  Lab 09/17/21 1224 09/18/21 0616  WBC 8.2 8.9  NEUTROABS 6.1  --   HGB 11.9* 11.5*  HCT 35.2* 36.4*  MCV 80.0 86.1  PLT 364 563   Basic Metabolic Panel: Recent Labs  Lab 09/17/21 1224 09/18/21 0616  NA 142 136  K 5.0 4.5  CL 108 104  CO2 20* 20*  GLUCOSE 96 90  BUN 41* 43*  CREATININE 4.23* 4.37*  CALCIUM 9.5 8.6*  PHOS 6.3*  --    GFR: Estimated Creatinine Clearance: 26.7 mL/min (A) (by C-G formula based on SCr of 4.37 mg/dL (H)). Liver Function Tests: Recent Labs  Lab 09/17/21 1224 09/18/21 0616  AST 19 19  ALT 27 24  ALKPHOS 71 58  BILITOT 0.5 0.7  PROT 6.8 6.6  ALBUMIN 3.3* 3.5   No results for input(s): LIPASE, AMYLASE in the last 168 hours. No results for input(s): AMMONIA in the last 168 hours. Coagulation Profile: Recent Labs  Lab 09/18/21 0616  INR 1.2   Cardiac Enzymes: Recent Labs  Lab 09/17/21 1753  CKTOTAL 111   BNP (last 3 results) No results for input(s): PROBNP in the last 8760 hours. HbA1C: No results for input(s): HGBA1C in the last 72 hours. CBG: No results for input(s): GLUCAP in the last 168 hours. Lipid Profile: No results for input(s): CHOL, HDL, LDLCALC, TRIG, CHOLHDL, LDLDIRECT in the last 72 hours. Thyroid Function Tests: No results for input(s): TSH, T4TOTAL, FREET4, T3FREE, THYROIDAB in the last 72 hours. Anemia Panel: No results for input(s): VITAMINB12, FOLATE, FERRITIN, TIBC, IRON, RETICCTPCT in the last 72 hours. Sepsis Labs: No results for input(s): PROCALCITON, LATICACIDVEN in the last 168 hours.  No results found for this or any previous visit (from the past 240 hour(s)).       Radiology Studies: US RENAL  Result Date: 09/17/2021 CLINICAL DATA:  Acute kidney injury EXAM: RENAL / URINARY TRACT ULTRASOUND COMPLETE COMPARISON:  09/09/2021 ultrasound  FINDINGS: Right Kidney: Renal measurements: 13 x 7.6 x 7.5 cm = volume: 384.4 mL. Echogenicity within normal limits. No mass or hydronephrosis visualized. Left Kidney: Renal measurements: 13.3 x 8.8 x 8.6 cm = volume: 511.9 mL. Echogenicity within normal limits. No mass or hydronephrosis visualized. Bladder: Urinary bladder is empty. Other: Small amount of abdominal ascites IMPRESSION: 1. Generous size kidneys which are otherwise normal. 2. Small amount of ascites. Electronically Signed   By: Donavan Foil M.D.   On: 09/17/2021 18:36   DG CHEST PORT 1 VIEW  Result Date: 09/18/2021 CLINICAL DATA:  Shortness of breath.  T-cell lymphoma. EXAM: PORTABLE CHEST 1 VIEW COMPARISON:  Chest x-ray 09/07/2021. FINDINGS: There is a new small right pleural effusion. There is no focal lung consolidation or pneumothorax. Cardiomediastinal silhouette is within normal limits. No acute fractures are seen. Surgical clip overlies the right lower chest. There are surgical changes in the right shoulder. IMPRESSION: 1. New small right pleural effusion. Electronically Signed   By: Ronney Asters M.D.   On: 09/18/2021 19:52   [place] ASCITES (ABDOMEN LIMITED)  Result Date: 09/18/2021 CLINICAL DATA:  Abdominal ascites.  Eval for paracentesis. EXAM: LIMITED ABDOMEN ULTRASOUND FOR ASCITES TECHNIQUE: Limited ultrasound survey for ascites was performed in all four abdominal quadrants. COMPARISON:  CT AP, 09/03/2021.  US Renal, 09/17/2021. FINDINGS: Focused sonographic evaluation of the abdomen at the RIGHT upper, LEFT upper, RIGHT lower and LEFT lower quadrants. Small volume ascites is appreciated, without a large enough volume nor safe window for paracentesis Paracentesis was performed. IMPRESSION: Small volume ascites.  No safe window for paracentesis. This examination was performed by: Soyla Dryer, NP IR. Electronically Signed   By: Michaelle Birks M.D.   On: 09/18/2021 15:24        Scheduled Meds:  allopurinol  300 mg Oral Daily    heparin  5,000 Units Subcutaneous Q8H   Continuous Infusions:   LOS: 1 day    Time spent: 35 mins    Kathie Dike, MD [facility]   If 7PM-7AM, please contact night-coverage [email or web address]  09/18/2021, 8:50 PM

## 2021-09-19 DIAGNOSIS — N179 Acute kidney failure, unspecified: Secondary | ICD-10-CM | POA: Diagnosis not present

## 2021-09-19 DIAGNOSIS — E883 Tumor lysis syndrome: Secondary | ICD-10-CM

## 2021-09-19 DIAGNOSIS — C8590 Non-Hodgkin lymphoma, unspecified, unspecified site: Secondary | ICD-10-CM | POA: Diagnosis present

## 2021-09-19 DIAGNOSIS — R591 Generalized enlarged lymph nodes: Secondary | ICD-10-CM | POA: Diagnosis not present

## 2021-09-19 DIAGNOSIS — R188 Other ascites: Secondary | ICD-10-CM | POA: Diagnosis not present

## 2021-09-19 HISTORY — DX: Tumor lysis syndrome: E88.3

## 2021-09-19 LAB — URINALYSIS, COMPLETE (UACMP) WITH MICROSCOPIC
Bilirubin Urine: NEGATIVE
Glucose, UA: NEGATIVE mg/dL
Ketones, ur: NEGATIVE mg/dL
Leukocytes,Ua: NEGATIVE
Nitrite: NEGATIVE
Protein, ur: 30 mg/dL — AB
Specific Gravity, Urine: 1.01 (ref 1.005–1.030)
pH: 5 (ref 5.0–8.0)

## 2021-09-19 LAB — RENAL FUNCTION PANEL
Albumin: 3.4 g/dL — ABNORMAL LOW (ref 3.5–5.0)
Anion gap: 11 (ref 5–15)
BUN: 49 mg/dL — ABNORMAL HIGH (ref 6–20)
CO2: 19 mmol/L — ABNORMAL LOW (ref 22–32)
Calcium: 8.4 mg/dL — ABNORMAL LOW (ref 8.9–10.3)
Chloride: 106 mmol/L (ref 98–111)
Creatinine, Ser: 4.95 mg/dL — ABNORMAL HIGH (ref 0.61–1.24)
GFR, Estimated: 13 mL/min — ABNORMAL LOW (ref >60)
Glucose, Bld: 89 mg/dL (ref 70–99)
Phosphorus: 6.3 mg/dL — ABNORMAL HIGH (ref 2.5–4.6)
Potassium: 5 mmol/L (ref 3.5–5.1)
Sodium: 136 mmol/L (ref 135–145)

## 2021-09-19 MED ORDER — ALBUTEROL SULFATE (2.5 MG/3ML) 0.083% IN NEBU
2.5000 mg | INHALATION_SOLUTION | Freq: Four times a day (QID) | RESPIRATORY_TRACT | 12 refills | Status: DC | PRN
Start: 2021-09-19 — End: 2021-12-02

## 2021-09-19 MED ORDER — DIPHENHYDRAMINE HCL 25 MG PO CAPS: 25.0000 mg | ORAL_CAPSULE | Freq: Four times a day (QID) | ORAL | 0 refills | Status: DC | PRN

## 2021-09-19 MED ORDER — ALLOPURINOL 300 MG PO TABS: 300.0000 mg | ORAL_TABLET | Freq: Every day | ORAL | Status: DC

## 2021-09-19 MED ORDER — LOPERAMIDE HCL 2 MG PO CAPS
2.0000 mg | ORAL_CAPSULE | Freq: Four times a day (QID) | ORAL | Status: DC | PRN
  Administered 2021-09-19: 2 mg via ORAL
  Filled 2021-09-19: qty 1

## 2021-09-19 MED ORDER — LOPERAMIDE HCL 2 MG PO CAPS: 2.0000 mg | ORAL_CAPSULE | Freq: Four times a day (QID) | ORAL | 0 refills | Status: DC | PRN

## 2021-09-19 NOTE — Progress Notes (Signed)
[place] [phone]   [phone]  PROGRESS NOTE  Patient Care Team: Deland Pretty, MD as PCP - General (Internal Medicine)  Hematological/Oncological History # T Cell Lymphoma  1) Initially presented with worsening abdominal distention and shortness of breath x 4 weeks  2) 09/03/2021: CT abdomen/pelvis: Bulky abdominal and pelvic lymphadenopathy, ill-defined bilateral renal masses, diffuse mesenteric and omental misty opacity and mild ascites.  2 indeterminate liver lesions, may represent hemangiomas.  Moderate right pleural effusion and tiny left pleural effusion.  Left lower lobe pleural-based pulmonary nodule measuring at least 12 mm   3) 09/04/2021:CT chest: No thoracic adenopathy identified.  Unchanged moderate right and trace left pleural effusions.  Nonspecific part solid nodule in the superior segment of the left lower lobe, potentially inflammatory.   4) 09/07/2021: Underwent right thoracentesis removing 1.3 L of pleural fluid.  Cytology revealed reactive mesothelial cells and numerous lymphoid cells. Findings favored to be reactive in nature.   5) 09/10/2021: US guided left inguinal lymph node core biopsy.  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   6) 09/17/2021: Established care with [facility]  Clinical Summary: Kenneth Wiggins 60 y.o. 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  Interval History:  --received rasburicase 6 mg IV on admission.  --started on allopurinol 300mg  PO daily --Cr 4.95 increased from 4.23 on admission --no f/c/s overnight. Patient developed no new symptoms.  --transfer declined at [facility], but accepted at [facility].   MEDICAL HISTORY:  Past Medical History:  Diagnosis Date   Ascites    Asthma as child   hx of    Cancer [facility])    prostate - psa 3.93 on 07/06/2012   GERD (gastroesophageal reflux disease)    hx of   H/O hiatal hernia 15 years ago   Hypertension    Prostate cancer ([place]) 08/09/2012   Gleason 3+3=6, vol 20-25 gm   Prostate cancer ([place]) 01/10/2013   Seed implant to be done due to Robotic surgery aborted   Sleep apnea    CPAP    SURGICAL HISTORY: Past Surgical History:  Procedure Laterality Date   CARPAL TUNNEL RELEASE Right 04/03/2021   Procedure: RIGHT CARPAL TUNNEL RELEASE;  Surgeon: Leandrew Koyanagi, MD;  Location: [place];  Service: Orthopedics;  Laterality: Right;   HERNIA REPAIR  as child   KNEE SURGERY Left as child   tore a ligament    MASS EXCISION Right 03/07/2021   Procedure: EXCISION MASS OF RIGHT CHEST;  Surgeon: Johnathan Hausen, MD;  Location: [facility];  Service: General;  Laterality: Right;   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   RADIOACTIVE SEED IMPLANT N/A 01/17/2013   Procedure: RADIOACTIVE SEED IMPLANT;  Surgeon: Bernestine Amass, MD;  Location: [facility];  Service: Urology;  Laterality: N/A;  W/ MURRAY    ROBOT ASSISTED LAPAROSCOPIC RADICAL PROSTATECTOMY N/A 11/24/2012   Procedure: Attempted ROBOTIC ASSISTED LAPAROSCOPIC RADICAL PROSTATECTOMY, Abandoned;  Surgeon: Bernestine Amass, MD;  Location: [facility];  Service: Urology;  Laterality: N/A;   scrotum explotion Left    SHOULDER ARTHROSCOPY WITH ROTATOR CUFF REPAIR AND SUBACROMIAL DECOMPRESSION Right 04/03/2021   Procedure: RIGHT SHOULDER ARTHROSCOPY ROTATOR CUFF REPAIR, SUBACROMIAL DECOMPRESSION, EXTENSIVE DEBRIDEMENT;  Surgeon: Leandrew Koyanagi, MD;  Location: [place];  Service: Orthopedics;  Laterality: Right;   testical growth removed     VASECTOMY  SOCIAL HISTORY: Social History   Socioeconomic History   Marital status: Widowed    Spouse name: Not on file   Number of children: 2   Years of education: Not on file   Highest education level: Not on file  Occupational History   Occupation: TRUCK DRIVER      Employer: [employer]  Tobacco Use   Smoking status: Never   Smokeless tobacco: Never  Vaping Use   Vaping Use: Never used  Substance and Sexual Activity   Alcohol use: No   Drug use: No   Sexual activity: Yes  Other Topics Concern   Not on file  Social History Narrative   Not on file   Social Determinants of Health   Financial Resource Strain: Not on file  Food Insecurity: Not on file  Transportation Needs: Not on file  Physical Activity: Not on file  Stress: Not on file  Social Connections: Not on file  Intimate Partner Violence: Not on file    FAMILY HISTORY: Family History  Problem Relation Age of Onset   Cancer Maternal Aunt        colon   Cancer Paternal Uncle        prostate, 2 uncles   Cancer Daughter        angiosarcoma age 78   Prostate cancer Other     ALLERGIES:  has No Known Allergies.  MEDICATIONS:  Current Facility-Administered Medications  Medication Dose Route Frequency Provider Last Rate Last Admin   acetaminophen (TYLENOL) tablet 650 mg  650 mg Oral Q6H PRN Pokhrel, Laxman, MD       Or   acetaminophen (TYLENOL) suppository 650 mg  650 mg Rectal Q6H PRN Pokhrel, Laxman, MD       albuterol (PROVENTIL) (2.5 MG/3ML) 0.083% nebulizer solution 2.5 mg  2.5 mg Nebulization Q6H PRN Dimple Nanas, RPH   2.5 mg at 09/19/21 1742   allopurinol (ZYLOPRIM) tablet 300 mg  300 mg Oral Daily Pokhrel, Laxman, MD   300 mg at 09/19/21 0935   diphenhydrAMINE (BENADRYL) capsule 25 mg  25 mg Oral Q6H PRN Kathie Dike, MD   25 mg at 09/19/21 1704   heparin injection 5,000 Units  5,000 Units Subcutaneous Q8H Pokhrel, Laxman, MD   5,000 Units at 09/19/21 1500   loperamide (IMODIUM) capsule 2 mg  2 mg Oral Q6H PRN Kathie Dike, MD   2 mg at 09/19/21 1237   ondansetron (ZOFRAN) tablet 4 mg  4 mg Oral Q6H PRN Pokhrel, Laxman, MD       Or   ondansetron (ZOFRAN) injection 4 mg  4 mg Intravenous Q6H PRN Pokhrel, Laxman, MD       oxyCODONE (Oxy IR/ROXICODONE)  immediate release tablet 5 mg  5 mg Oral Q6H PRN Pokhrel, Laxman, MD   5 mg at 09/19/21 1704   Current Outpatient Medications  Medication Sig Dispense Refill   Multiple Vitamins-Minerals (MULTIVITAMIN WITH MINERALS) tablet Take 1 tablet by mouth daily.     oxyCODONE (OXY IR/ROXICODONE) 5 MG immediate release tablet Take 1 tablet (5 mg total) by mouth every 6 (six) hours as needed for pain. 90 tablet 0   albuterol (PROVENTIL) (2.5 MG/3ML) 0.083% nebulizer solution Take 3 mLs (2.5 mg total) by nebulization every 6 (six) hours as needed for wheezing or shortness of breath. 75 mL 12   [START ON 09/20/2021] allopurinol (ZYLOPRIM) 300 MG tablet Take 1 tablet (300 mg total) by mouth daily.     diphenhydrAMINE (BENADRYL) 25 mg capsule  Take 1 capsule (25 mg total) by mouth every 6 (six) hours as needed for itching. 30 capsule 0   loperamide (IMODIUM) 2 MG capsule Take 1 capsule (2 mg total) by mouth every 6 (six) hours as needed for diarrhea or loose stools. 30 capsule 0    REVIEW OF SYSTEMS:   Constitutional: ( - ) fevers, ( - )  chills , ( - ) night sweats Eyes: ( - ) blurriness of vision, ( - ) double vision, ( - ) watery eyes Ears, nose, mouth, throat, and face: ( - ) mucositis, ( - ) sore throat Respiratory: ( - ) cough, ( - ) dyspnea, ( - ) wheezes Cardiovascular: ( - ) palpitation, ( - ) chest discomfort, ( - ) lower extremity swelling Gastrointestinal:  ( - ) nausea, ( - ) heartburn, ( - ) change in bowel habits Skin: ( - ) abnormal skin rashes Lymphatics: ( - ) new lymphadenopathy, ( - ) easy bruising Neurological: ( - ) numbness, ( - ) tingling, ( - ) new weaknesses Behavioral/Psych: ( - ) mood change, ( - ) new changes  All other systems were reviewed with the patient and are negative.  PHYSICAL EXAMINATION: ECOG PERFORMANCE STATUS: 1 - Symptomatic but completely ambulatory  Vitals:   09/19/21 1744 09/19/21 2021  BP:  (!) 162/88  Pulse:  (!) 105  Resp:  20  Temp:    SpO2: 97%     Filed Weights   09/17/21 1831 09/18/21 0424 09/19/21 0615  Weight: (!) 324 lb 1.2 oz (147 kg) (!) 330 lb 11 oz (150 kg) (!) 341 lb 14.9 oz (155.1 kg)    GENERAL: well appearing middle aged [place] American male, alert, no distress and comfortable SKIN: skin color, texture, turgor are normal, no rashes or significant lesions EYES: conjunctiva are pink and non-injected, sclera clear LUNGS: clear to auscultation and percussion with normal breathing effort HEART: regular rate & rhythm and no murmurs and no lower extremity edema ABDOMEN: distended, but non-tender with normal bowel sounds Musculoskeletal: no cyanosis of digits and no clubbing  PSYCH: alert & oriented x 3, fluent speech NEURO: no focal motor/sensory deficits  LABORATORY DATA:  I have reviewed the data as listed CBC Latest Ref Rng & Units 09/18/2021 09/17/2021 09/08/2021  WBC 4.0 - 10.5 K/uL 8.9 8.2 6.3  Hemoglobin 13.0 - 17.0 g/dL 11.5(L) 11.9(L) 11.9(L)  Hematocrit 39.0 - 52.0 % 36.4(L) 35.2(L) 36.6(L)  Platelets 150 - 400 K/uL 336 364 361    CMP Latest Ref Rng & Units 09/19/2021 09/18/2021 09/17/2021  Glucose 70 - 99 mg/dL 89 90 96  BUN 6 - 20 mg/dL 49(H) 43(H) 41(H)  Creatinine 0.61 - 1.24 mg/dL 4.95(H) 4.37(H) 4.23(HH)  Sodium 135 - 145 mmol/L 136 136 142  Potassium 3.5 - 5.1 mmol/L 5.0 4.5 5.0  Chloride 98 - 111 mmol/L 106 104 108  CO2 22 - 32 mmol/L 19(L) 20(L) 20(L)  Calcium 8.9 - 10.3 mg/dL 8.4(L) 8.6(L) 9.5  Total Protein 6.5 - 8.1 g/dL - 6.6 6.8  Total Bilirubin 0.3 - 1.2 mg/dL - 0.7 0.5  Alkaline Phos 38 - 126 U/L - 58 71  AST 15 - 41 U/L - 19 19  ALT 0 - 44 U/L - 24 27    RADIOGRAPHIC STUDIES: DG Chest 1 View  Result Date: 09/07/2021 CLINICAL DATA:  Right thoracentesis EXAM: CHEST  1 VIEW COMPARISON:  Yesterday FINDINGS: Normal heart size and mediastinal contours. No acute infiltrate or edema. No effusion  or pneumothorax. No acute osseous findings. IMPRESSION: No evidence of thoracentesis complication.  No visible residual fluid. Electronically Signed   By: Jorje Guild M.D.   On: 09/07/2021 09:19   DG Chest 2 View  Result Date: 09/06/2021 CLINICAL DATA:  Shortness of breath EXAM: CHEST - 2 VIEW COMPARISON:  Chest radiograph 03/06/2018, CT chest 09/04/2021 FINDINGS: The cardiomediastinal silhouette is within normal limits. There is a moderate size right pleural effusion with adjacent opacity in the right base likely reflecting atelectasis. The right upper lung is well-aerated. There is a smaller left pleural effusion. The left lung is otherwise clear. There is no pneumothorax. There is no acute osseous abnormality. IMPRESSION: Moderate right and small left pleural effusions with adjacent right basilar atelectasis. Electronically Signed   By: Valetta Mole M.D.   On: 09/06/2021 13:52   CT CHEST W CONTRAST  Result Date: 09/04/2021 CLINICAL DATA:  Abdominal tightness for 1 month. Bulky abdominal adenopathy on recent CT. History of prostate cancer. Creatinine was obtained on site at [place] at [address] Results: Creatinine 1.39 mg/dL. EXAM: CT CHEST WITH CONTRAST TECHNIQUE: Multidetector CT imaging of the chest was performed during intravenous contrast administration. CONTRAST:  39mL ISOVUE-300 IOPAMIDOL (ISOVUE-300) INJECTION 61% COMPARISON:  Abdominopelvic CT 09/03/2021. Chest radiographs 03/06/2018. FINDINGS: Cardiovascular: Limited contrast opacification. Allowing for this, no acute vascular findings are identified. The heart size is normal. There is no pericardial effusion. Mediastinum/Nodes: There are no enlarged mediastinal, hilar or axillary lymph nodes. The thyroid gland, trachea and esophagus demonstrate no significant findings. Lungs/Pleura: Again demonstrated is moderate-sized dependent right pleural effusion, similar to recent abdominal CT. Trace pleural fluid on the left. Mild dependent atelectasis in both lungs. There is a subpleural part solid nodule in the superior  segment of the left lower lobe, measuring 1.4 x 0.9 cm on image 68/8. No other pulmonary nodules are identified. Upper abdomen: Findings in the visualized upper abdomen are unchanged from yesterday CT. These include a heterogeneous soft tissue mass at the splenic hilum measuring 9.5 x 7.7 cm on image 119/2, a subcapsular mass posteriorly in the right hepatic lobe, suspected bilateral renal masses and extensive confluent retroperitoneal and mesenteric lymphadenopathy. There is a small amount of ascites. Musculoskeletal/Chest wall: There is no chest wall mass or suspicious osseous finding. Scattered bone islands, most conspicuous within the right 5th and 7th ribs. IMPRESSION: 1. No thoracic adenopathy identified. 2. Unchanged moderate right and trace left pleural effusions. 3. Nonspecific part solid nodule in the superior segment of the left lower lobe, potentially inflammatory. No other suspicious pulmonary nodules. Attention on follow-up recommended. 4. Stable findings in the upper abdomen from yesterday's CT, remaining suspicious for lymphoma. Electronically Signed   By: Richardean Sale M.D.   On: 09/04/2021 10:29   CT ABDOMEN PELVIS W CONTRAST  Result Date: 09/03/2021 CLINICAL DATA:  Abdominal pain and swelling for 1 month. Weight loss. Personal history of prostate carcinoma. EXAM: CT ABDOMEN AND PELVIS WITH CONTRAST TECHNIQUE: Multidetector CT imaging of the abdomen and pelvis was performed using the standard protocol following bolus administration of intravenous contrast. CONTRAST:  151mL ISOVUE-370 IOPAMIDOL (ISOVUE-370) INJECTION 76% COMPARISON:  None. FINDINGS: Lower Chest: Moderate right pleural effusion and mild right lower lobe atelectasis. A tiny left pleural effusion is seen, and a pleural-based nodule is seen in the posterior left lower lobe which is incompletely visualized, but measures at least 12 mm. Hepatobiliary: A 6 x 3 cm low-attenuation lesion is seen in the posterior right hepatic lobe  which shows evidence of peripheral nodular contrast enhancement a 2nd lesion is also seen in the anterior right hepatic lobe which measures 2.3 cm and shows signs of mild peripheral contrast enhancement. These lesions are not definitively characterized on this exam, but may represent hemangiomas. Gallbladder is unremarkable. No evidence of biliary ductal dilatation. Pancreas:  No mass or inflammatory changes. Spleen: Within normal limits in size and appearance. Bulky soft tissue mass is seen in the left subdiaphragmatic region which abuts the spleen, measuring 8.6 x 8.1 cm on image 16/2. Adrenals/Urinary Tract: Adrenal glands are normal in appearance. Several ill-defined low-attenuation masses are seen in both kidneys which are difficult to measure due to their poorly defined margins. No evidence of hydronephrosis. Stomach/Bowel: Encasement of bowel loops by abnormal mesenteric soft tissue density is seen, however there is no evidence of bowel obstruction. Vascular/Lymphatic: Bulky abdominal lymphadenopathy is seen throughout the retroperitoneum and small bowel mesentery, which encases the mesenteric vessels, hilum, pancreas, and aorta and IVC. Mild retrocrural lymphadenopathy also noted. Mild pelvic lymphadenopathy is seen in bilateral iliac lymph node chains and pelvic mesentery. Misty soft tissue density is seen throughout the mesenteric and omental fat, and mild ascites is also demonstrated. Reproductive: Brachytherapy seeds are seen throughout the prostate bed. Other: Small bilateral inguinal hernias are seen, both containing only fat. Musculoskeletal: No suspicious bone lesions identified. Nonaggressive appearing chondrous bone lesion is seen in the left ilium. IMPRESSION: Bulky abdominal and pelvic lymphadenopathy, ill-defined bilateral renal masses, diffuse mesenteric and omental misty opacity, and mild ascites. These findings are highly suspicious for lymphoma, with metastatic disease considered less  likely. Two indeterminate liver lesions, which may represent hemangiomas. Consider abdomen MRI without and with contrast or PET-CT for further evaluation. Moderate right pleural effusion and tiny left pleural effusion, and left lower lobe pleural-based pulmonary nodule measuring at least 12 mm. Lymphoma or metastatic disease cannot be excluded. These results will be called to the ordering clinician or representative by the Radiologist Assistant, and communication documented in the PACS or [employer]. Electronically Signed   By: Marlaine Hind M.D.   On: 09/03/2021 08:43   US RENAL  Result Date: 09/17/2021 CLINICAL DATA:  Acute kidney injury EXAM: RENAL / URINARY TRACT ULTRASOUND COMPLETE COMPARISON:  09/09/2021 ultrasound FINDINGS: Right Kidney: Renal measurements: 13 x 7.6 x 7.5 cm = volume: 384.4 mL. Echogenicity within normal limits. No mass or hydronephrosis visualized. Left Kidney: Renal measurements: 13.3 x 8.8 x 8.6 cm = volume: 511.9 mL. Echogenicity within normal limits. No mass or hydronephrosis visualized. Bladder: Urinary bladder is empty. Other: Small amount of abdominal ascites IMPRESSION: 1. Generous size kidneys which are otherwise normal. 2. Small amount of ascites. Electronically Signed   By: Donavan Foil M.D.   On: 09/17/2021 18:36   US RENAL  Result Date: 09/09/2021 CLINICAL DATA:  Acute renal insufficiency. EXAM: RENAL / URINARY TRACT ULTRASOUND COMPLETE COMPARISON:  None FINDINGS: Right Kidney: Renal measurements: 12.5 x 6.5 x 6.9 cm = volume: 293 mL. Echogenicity within normal limits. No mass or hydronephrosis visualized. Left Kidney: Renal measurements: 14.2 x 2.8 x 7.4 cm = volume: 453 mL. Echogenicity within normal limits. No mass or hydronephrosis visualized. Bladder: Not visualized. Other: There is a small perihepatic ascites. IMPRESSION: 1. Unremarkable kidneys. 2. Small ascites. Electronically Signed   By: Anner Crete M.D.   On: 09/09/2021 19:48   DG CHEST PORT 1  VIEW  Result Date: 09/18/2021 CLINICAL DATA:  Shortness of breath.  T-cell lymphoma. EXAM: PORTABLE CHEST 1  VIEW COMPARISON:  Chest x-ray 09/07/2021. FINDINGS: There is a new small right pleural effusion. There is no focal lung consolidation or pneumothorax. Cardiomediastinal silhouette is within normal limits. No acute fractures are seen. Surgical clip overlies the right lower chest. There are surgical changes in the right shoulder. IMPRESSION: 1. New small right pleural effusion. Electronically Signed   By: Ronney Asters M.D.   On: 09/18/2021 19:52   [place] ASCITES (ABDOMEN LIMITED)  Result Date: 09/18/2021 CLINICAL DATA:  Abdominal ascites.  Eval for paracentesis. EXAM: LIMITED ABDOMEN ULTRASOUND FOR ASCITES TECHNIQUE: Limited ultrasound survey for ascites was performed in all four abdominal quadrants. COMPARISON:  CT AP, 09/03/2021.  US Renal, 09/17/2021. FINDINGS: Focused sonographic evaluation of the abdomen at the RIGHT upper, LEFT upper, RIGHT lower and LEFT lower quadrants. Small volume ascites is appreciated, without a large enough volume nor safe window for paracentesis Paracentesis was performed. IMPRESSION: Small volume ascites.  No safe window for paracentesis. This examination was performed by: Soyla Dryer, NP IR. Electronically Signed   By: Michaelle Birks M.D.   On: 09/18/2021 15:24   [place] ASCITES (ABDOMEN LIMITED)  Result Date: 09/07/2021 CLINICAL DATA:  History of prostate cancer, now with bulky retroperitoneal and pelvic lymphadenopathy worrisome for lymphoma versus metastatic disease. Please perform image guided biopsy for tissue diagnostic purposes. EXAM: LIMITED ABDOMEN ULTRASOUND FOR ASCITES TECHNIQUE: Limited ultrasound survey for ascites was performed in all four abdominal quadrants. COMPARISON:  CT abdomen and pelvis-09/03/2021 FINDINGS: Sonographic evaluation of the abdomen demonstrates a trace amount of intra-abdominal ascites, too small to allow for safe ultrasound-guided  paracentesis. No paracentesis attempted. IMPRESSION: Trace amount of intra-abdominal ascites, too small to allow for safe ultrasound-guided paracentesis. No paracentesis attempted. Electronically Signed   By: Sandi Mariscal M.D.   On: 09/07/2021 10:46   [place] CORE BIOPSY (LYMPH NODES)  Result Date: 09/10/2021 INDICATION: Extensive retroperitoneal adenopathy. Enlarged left inguinal lymph node. EXAM: ULTRASOUND GUIDED CORE BIOPSY OF LEFT INGUINAL ADENOPATHY MEDICATIONS: Lidocaine 1% subcutaneous ANESTHESIA/SEDATION: Intravenous Fentanyl 145mcg and Versed 2mg  were administered as conscious sedation during continuous monitoring of the patient's level of consciousness and physiological / cardiorespiratory status by the radiology RN, with a total moderate sedation time of 10 minutes. PROCEDURE: The procedure, risks, benefits, and alternatives were explained to the patient. Questions regarding the procedure were encouraged and answered. The patient understands and consents to the procedure. Survey ultrasound of the left inguinal lesion performed. Adenopathy was localized and an appropriate skin entry site was determined and marked. The operative field was prepped with chlorhexidine in a sterile fashion, and a sterile drape was applied covering the operative field. A sterile gown and sterile gloves were used for the procedure. Local anesthesia was provided with 1% Lidocaine. Under real-time ultrasound guidance, multiple core biopsy samples of the left inguinal adenopathy obtained with the 10 cm 18 gauge automated biopsy device. These were submitted in saline to surgical pathology. Postprocedure scans show no hemorrhage or other apparent complication. The patient tolerated the procedure well. COMPLICATIONS: None immediate. FINDINGS: Left inguinal adenopathy was localized. Representative core biopsy samples obtained as above. IMPRESSION: 1. Technically successful ultrasound-guided core biopsy, left inguinal adenopathy.  Electronically Signed   By: Lucrezia Europe M.D.   On: 09/10/2021 15:55   VAS [place] LOWER EXTREMITY VENOUS (DVT)  Result Date: 09/09/2021  Lower Venous DVT Study Patient Name:  Kenneth Wiggins  Date of Exam:   09/07/2021 Medical Rec #: [id number]        Accession #:  [id number] Date of Birth: 11/25/1960         Patient Gender: M Patient Age:   61 years Exam Location:  [facility] Procedure:      VAS [place] LOWER EXTREMITY VENOUS (DVT) Referring Phys: Annamaria Boots XU --------------------------------------------------------------------------------  Indications: Edema, abdomen distention w/ ascites, history of prostate cancer.  Limitations: Body habitus and poor ultrasound/tissue interface. Comparison Study: No prior studies. Performing Technologist: Darlin Coco RDMS, RVT  Examination Guidelines: A complete evaluation includes B-mode imaging, spectral Doppler, color Doppler, and power Doppler as needed of all accessible portions of each vessel. Bilateral testing is considered an integral part of a complete examination. Limited examinations for reoccurring indications may be performed as noted. The reflux portion of the exam is performed with the patient in reverse Trendelenburg.  +---------+---------------+---------+-----------+----------+--------------+ RIGHT    CompressibilityPhasicitySpontaneityPropertiesThrombus Aging +---------+---------------+---------+-----------+----------+--------------+ CFV      Full           Yes      Yes                                 +---------+---------------+---------+-----------+----------+--------------+ SFJ      Full                                                        +---------+---------------+---------+-----------+----------+--------------+ FV Prox  Full                                                        +---------+---------------+---------+-----------+----------+--------------+ FV Mid   Full                                                         +---------+---------------+---------+-----------+----------+--------------+ FV DistalFull                                                        +---------+---------------+---------+-----------+----------+--------------+ PFV      Full                                                        +---------+---------------+---------+-----------+----------+--------------+ POP      Full           Yes      Yes                                 +---------+---------------+---------+-----------+----------+--------------+ PTV      Full                                                        +---------+---------------+---------+-----------+----------+--------------+  PERO     Full                                                        +---------+---------------+---------+-----------+----------+--------------+ Gastroc  Full                                                        +---------+---------------+---------+-----------+----------+--------------+   +---------+---------------+---------+-----------+----------+-------------------+ LEFT     CompressibilityPhasicitySpontaneityPropertiesThrombus Aging      +---------+---------------+---------+-----------+----------+-------------------+ CFV      Full           Yes      Yes                                      +---------+---------------+---------+-----------+----------+-------------------+ SFJ      Full                                                             +---------+---------------+---------+-----------+----------+-------------------+ FV Prox  Full                                                             +---------+---------------+---------+-----------+----------+-------------------+ FV Mid   Full                                                             +---------+---------------+---------+-----------+----------+-------------------+ FV DistalFull                                                              +---------+---------------+---------+-----------+----------+-------------------+ PFV      Full                                                             +---------+---------------+---------+-----------+----------+-------------------+ POP      Full                                                             +---------+---------------+---------+-----------+----------+-------------------+ PTV  Full                                                             +---------+---------------+---------+-----------+----------+-------------------+ PERO     Full                                         Not well visualized +---------+---------------+---------+-----------+----------+-------------------+ Gastroc  Full                                                             +---------+---------------+---------+-----------+----------+-------------------+     Summary: RIGHT: - There is no evidence of deep vein thrombosis in the lower extremity.  - No cystic structure found in the popliteal fossa.  LEFT: - There is no evidence of deep vein thrombosis in the lower extremity. However, portions of this examination were limited- see technologist comments above.  - No cystic structure found in the popliteal fossa.  *See table(s) above for measurements and observations. Electronically signed by Orlie Pollen on 09/09/2021 at 10:17:25 AM.    Final    US THORACENTESIS ASP PLEURAL SPACE W/IMG GUIDE  Result Date: 09/07/2021 INDICATION: Shortness of breath. Right-sided pleural effusion. Request therapeutic and diagnostic thoracentesis. EXAM: ULTRASOUND GUIDED RIGHT THORACENTESIS MEDICATIONS: 1% plain lidocaine, 5 mL COMPLICATIONS: None immediate. PROCEDURE: An ultrasound guided thoracentesis was thoroughly discussed with the patient and questions answered. The benefits, risks, alternatives and complications were also discussed. The patient understands and wishes to proceed with the  procedure. Written consent was obtained. Ultrasound was performed to localize and mark an adequate pocket of fluid in the right chest. The area was then prepped and draped in the normal sterile fashion. 1% Lidocaine was used for local anesthesia. Under ultrasound guidance a 6 Fr Safe-T-Centesis catheter was introduced. Thoracentesis was performed. The catheter was removed and a dressing applied. FINDINGS: A total of approximately 1.3 L of clear yellow fluid was removed. Samples were sent to the laboratory as requested by the clinical team. IMPRESSION: Successful ultrasound guided right thoracentesis yielding 1.3 L of pleural fluid. Read by: Ascencion Dike PA-C Electronically Signed   By: Sandi Mariscal M.D.   On: 09/07/2021 10:44    ASSESSMENT & PLAN Kenneth Wiggins 61 y.o. 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  #Concern for Tumor Lysis Syndrome --Creatinine elevated to 4.95 today, up from 4.37 yesterday.  Phosphorus noted to be 6.3 today --Uric acid 12.3 on admission, administered rasburicase 6 mg IV on 09/17/2021 --Continue allopurinol 300 mg p.o. daily --Continue with aggressive hydration  #T Cell Lymphoma -- Diagnosed by core biopsy of inguinal lymph node --Noted to be CD30 >1% reported after discussion with pathology, technically qualifies as CD30 positive.  --Treatment to begin as soon as is feasible, though given the complexity of this case adenopathy will be able to treat this in the outpatient setting --patient accepted at [facility] for management of this complex case.   #  Acute Kidney Injury, worsening -- Ultrasound of the kidneys revealed no hydronephrosis, findings are not due to pressure on the kidneys or ureters --Likely due to tumor lysis syndrome vs. Lymphoma infiltration of kidney. --appreciate recommendations/assistance of nephrology --Continue  aggressive hydration --Tumor lysis syndrome management as above.   All questions were answered. The patient knows to call the clinic with any problems, questions or concerns.  A total of more than 25 minutes were spent on this encounter with face-to-face time and non-face-to-face time, including preparing to see the patient, ordering tests and/or medications, counseling the patient and coordination of care as outlined above.   Ledell Peoples, MD Department of Hematology/Oncology [facility] at [facility] Phone: [phone] Pager: [phone] Email: [email or web address]  09/19/2021 10:01 PM

## 2021-09-19 NOTE — Discharge Summary (Signed)
Physician Discharge Summary  Kenneth Wiggins BWG:[id number] DOB: [birth year] DOA: 09/17/2021  PCP: Deland Pretty, MD  Admit date: 09/17/2021 Discharge date: 09/19/2021  Admitted From: home Disposition:  Transfer to [facility]  Recommendations for Outpatient Follow-up:  Patient will be transferred to [facility] for further management of lymphoma and renal failure  Discharge Condition:stable CODE STATUS:full code Diet recommendation: heart healthy  Brief/Interim Summary: 60 year-old male with a history of hypertension, struct of sleep apnea on CPAP, history of prostate cancer, was recently in the hospital from 11/25-11/29.  At that time, he complained of shortness of breath and was found to have right pleural effusion.  He also had abdominal distention where imaging indicated soft tissue mass/bulky abdominal lymphadenopathy.  Patient underwent thoracentesis at that time and underwent lymph node biopsy.  Results indicated T-cell lymphoma.  He was scheduled to follow-up with oncology for further management.  During that admission, he was also noted to have acute kidney injury was felt to be related to relative hypovolemia in the setting of recent IV contrast and ARB use.  Overall creatinine had improved to 2.6 at the time of discharge.  Patient was readmitted to the hospital on 12/6 with elevated creatinine back up to 4.2.  There was concern for underlying tumor lysis syndrome.  Discharge Diagnoses:  Principal Problem:   AKI (acute kidney injury) ([place]) Active Problems:   Prostate cancer ([place])   Ascites   OSA (obstructive sleep apnea)   Obesity, Class III, BMI 40-49.9 (morbid obesity) ([facility])   Pleural effusion on right   Lymphadenopathy   Lymphoma ([facility])   Tumor lysis syndrome  Acute kidney injury -Baseline creatinine appears to be around 1.3 -Noted to have a creatinine of 4.2 on current admission -Renal ultrasound negative for hydronephrosis -There was concern for underlying tumor  lysis syndrome and received a dose of rasburicase -He has been aggressively hydrated with IV fluids -Seen by nephrology and there was concern for infiltrative lymphomatous process in addition to possible tumor lysis syndrome. -Unfortunately, his creatinine has continued to trend up, currently 4.95 -Nephrology feels that it would be beneficial to treat underlying lymphoma -No indication for dialysis at the current time, but may need to support if renal function does not improve  T-cell lymphoma -Seen by Dr. Lorenso Courier -Noted to be CD30 negative, ALK status pending -Due to the patient's medical complexity, it was felt appropriate to initiate treatment at a tertiary care center -Dr. Lorenso Courier has discussed case with Dr. Rayann Heman at [facility] who is accepted patient in transfer  Possible tumor lysis syndrome -Received a dose of rasburicase 6 mg IV on 12/6 -He has been started on allopurinol 300 mg daily -Uric acid elevated at 12.3  Right pleural effusion -Underwent thoracentesis during last admission -He has recurrent shortness of breath, chest x-ray repeated and indicated recurrent right pleural effusion -Ultrasound-guided thoracentesis requested, although this not has been performed as of yet  Abdominal distention -Felt to be related to bulky lymphadenopathy -He has had several abdominal ultrasounds done and did not have significant ascites for paracentesis  Obstructive sleep apnea -Continue on CPAP  Discharge Instructions  Discharge Instructions     Diet - low sodium heart healthy   Complete by: As directed    Increase activity slowly   Complete by: As directed       Allergies as of 09/19/2021   No Known Allergies      Medication List     TAKE these medications  albuterol (2.5 MG/3ML) 0.083% nebulizer solution Commonly known as: PROVENTIL Take 3 mLs (2.5 mg total) by nebulization every 6 (six) hours as needed for wheezing or shortness of breath.   allopurinol 300 MG  tablet Commonly known as: ZYLOPRIM Take 1 tablet (300 mg total) by mouth daily. Start taking on: September 20, 2021   diphenhydrAMINE 25 mg capsule Commonly known as: BENADRYL Take 1 capsule (25 mg total) by mouth every 6 (six) hours as needed for itching.   loperamide 2 MG capsule Commonly known as: IMODIUM Take 1 capsule (2 mg total) by mouth every 6 (six) hours as needed for diarrhea or loose stools.   multivitamin with minerals tablet Take 1 tablet by mouth daily.   oxyCODONE 5 MG immediate release tablet Commonly known as: Oxy IR/ROXICODONE Take 1 tablet (5 mg total) by mouth every 6 (six) hours as needed for pain.        No Known Allergies  Consultations: Oncology Nephrology   Procedures/Studies: DG Chest 1 View  Result Date: 09/07/2021 CLINICAL DATA:  Right thoracentesis EXAM: CHEST  1 VIEW COMPARISON:  Yesterday FINDINGS: Normal heart size and mediastinal contours. No acute infiltrate or edema. No effusion or pneumothorax. No acute osseous findings. IMPRESSION: No evidence of thoracentesis complication. No visible residual fluid. Electronically Signed   By: Jorje Guild M.D.   On: 09/07/2021 09:19   DG Chest 2 View  Result Date: 09/06/2021 CLINICAL DATA:  Shortness of breath EXAM: CHEST - 2 VIEW COMPARISON:  Chest radiograph 03/06/2018, CT chest 09/04/2021 FINDINGS: The cardiomediastinal silhouette is within normal limits. There is a moderate size right pleural effusion with adjacent opacity in the right base likely reflecting atelectasis. The right upper lung is well-aerated. There is a smaller left pleural effusion. The left lung is otherwise clear. There is no pneumothorax. There is no acute osseous abnormality. IMPRESSION: Moderate right and small left pleural effusions with adjacent right basilar atelectasis. Electronically Signed   By: Valetta Mole M.D.   On: 09/06/2021 13:52   CT CHEST W CONTRAST  Result Date: 09/04/2021 CLINICAL DATA:  Abdominal tightness  for 1 month. Bulky abdominal adenopathy on recent CT. History of prostate cancer. Creatinine was obtained on site at [facility] at [address] Results: Creatinine 1.39 mg/dL. EXAM: CT CHEST WITH CONTRAST TECHNIQUE: Multidetector CT imaging of the chest was performed during intravenous contrast administration. CONTRAST:  39mL ISOVUE-300 IOPAMIDOL (ISOVUE-300) INJECTION 61% COMPARISON:  Abdominopelvic CT 09/03/2021. Chest radiographs 03/06/2018. FINDINGS: Cardiovascular: Limited contrast opacification. Allowing for this, no acute vascular findings are identified. The heart size is normal. There is no pericardial effusion. Mediastinum/Nodes: There are no enlarged mediastinal, hilar or axillary lymph nodes. The thyroid gland, trachea and esophagus demonstrate no significant findings. Lungs/Pleura: Again demonstrated is moderate-sized dependent right pleural effusion, similar to recent abdominal CT. Trace pleural fluid on the left. Mild dependent atelectasis in both lungs. There is a subpleural part solid nodule in the superior segment of the left lower lobe, measuring 1.4 x 0.9 cm on image 68/8. No other pulmonary nodules are identified. Upper abdomen: Findings in the visualized upper abdomen are unchanged from yesterday CT. These include a heterogeneous soft tissue mass at the splenic hilum measuring 9.5 x 7.7 cm on image 119/2, a subcapsular mass posteriorly in the right hepatic lobe, suspected bilateral renal masses and extensive confluent retroperitoneal and mesenteric lymphadenopathy. There is a small amount of ascites. Musculoskeletal/Chest wall: There is no chest wall mass or suspicious osseous finding. Scattered bone islands,  most conspicuous within the right 5th and 7th ribs. IMPRESSION: 1. No thoracic adenopathy identified. 2. Unchanged moderate right and trace left pleural effusions. 3. Nonspecific part solid nodule in the superior segment of the left lower lobe, potentially inflammatory. No  other suspicious pulmonary nodules. Attention on follow-up recommended. 4. Stable findings in the upper abdomen from yesterday's CT, remaining suspicious for lymphoma. Electronically Signed   By: Richardean Sale M.D.   On: 09/04/2021 10:29   CT ABDOMEN PELVIS W CONTRAST  Result Date: 09/03/2021 CLINICAL DATA:  Abdominal pain and swelling for 1 month. Weight loss. Personal history of prostate carcinoma. EXAM: CT ABDOMEN AND PELVIS WITH CONTRAST TECHNIQUE: Multidetector CT imaging of the abdomen and pelvis was performed using the standard protocol following bolus administration of intravenous contrast. CONTRAST:  165mL ISOVUE-370 IOPAMIDOL (ISOVUE-370) INJECTION 76% COMPARISON:  None. FINDINGS: Lower Chest: Moderate right pleural effusion and mild right lower lobe atelectasis. A tiny left pleural effusion is seen, and a pleural-based nodule is seen in the posterior left lower lobe which is incompletely visualized, but measures at least 12 mm. Hepatobiliary: A 6 x 3 cm low-attenuation lesion is seen in the posterior right hepatic lobe which shows evidence of peripheral nodular contrast enhancement a 2nd lesion is also seen in the anterior right hepatic lobe which measures 2.3 cm and shows signs of mild peripheral contrast enhancement. These lesions are not definitively characterized on this exam, but may represent hemangiomas. Gallbladder is unremarkable. No evidence of biliary ductal dilatation. Pancreas:  No mass or inflammatory changes. Spleen: Within normal limits in size and appearance. Bulky soft tissue mass is seen in the left subdiaphragmatic region which abuts the spleen, measuring 8.6 x 8.1 cm on image 16/2. Adrenals/Urinary Tract: Adrenal glands are normal in appearance. Several ill-defined low-attenuation masses are seen in both kidneys which are difficult to measure due to their poorly defined margins. No evidence of hydronephrosis. Stomach/Bowel: Encasement of bowel loops by abnormal mesenteric soft  tissue density is seen, however there is no evidence of bowel obstruction. Vascular/Lymphatic: Bulky abdominal lymphadenopathy is seen throughout the retroperitoneum and small bowel mesentery, which encases the mesenteric vessels, hilum, pancreas, and aorta and IVC. Mild retrocrural lymphadenopathy also noted. Mild pelvic lymphadenopathy is seen in bilateral iliac lymph node chains and pelvic mesentery. Misty soft tissue density is seen throughout the mesenteric and omental fat, and mild ascites is also demonstrated. Reproductive: Brachytherapy seeds are seen throughout the prostate bed. Other: Small bilateral inguinal hernias are seen, both containing only fat. Musculoskeletal: No suspicious bone lesions identified. Nonaggressive appearing chondrous bone lesion is seen in the left ilium. IMPRESSION: Bulky abdominal and pelvic lymphadenopathy, ill-defined bilateral renal masses, diffuse mesenteric and omental misty opacity, and mild ascites. These findings are highly suspicious for lymphoma, with metastatic disease considered less likely. Two indeterminate liver lesions, which may represent hemangiomas. Consider abdomen MRI without and with contrast or PET-CT for further evaluation. Moderate right pleural effusion and tiny left pleural effusion, and left lower lobe pleural-based pulmonary nodule measuring at least 12 mm. Lymphoma or metastatic disease cannot be excluded. These results will be called to the ordering clinician or representative by the Radiologist Assistant, and communication documented in the PACS or [employer]. Electronically Signed   By: Marlaine Hind M.D.   On: 09/03/2021 08:43   US RENAL  Result Date: 09/17/2021 CLINICAL DATA:  Acute kidney injury EXAM: RENAL / URINARY TRACT ULTRASOUND COMPLETE COMPARISON:  09/09/2021 ultrasound FINDINGS: Right Kidney: Renal measurements: 13 x 7.6 x 7.5  cm = volume: 384.4 mL. Echogenicity within normal limits. No mass or hydronephrosis visualized. Left  Kidney: Renal measurements: 13.3 x 8.8 x 8.6 cm = volume: 511.9 mL. Echogenicity within normal limits. No mass or hydronephrosis visualized. Bladder: Urinary bladder is empty. Other: Small amount of abdominal ascites IMPRESSION: 1. Generous size kidneys which are otherwise normal. 2. Small amount of ascites. Electronically Signed   By: Donavan Foil M.D.   On: 09/17/2021 18:36   US RENAL  Result Date: 09/09/2021 CLINICAL DATA:  Acute renal insufficiency. EXAM: RENAL / URINARY TRACT ULTRASOUND COMPLETE COMPARISON:  None FINDINGS: Right Kidney: Renal measurements: 12.5 x 6.5 x 6.9 cm = volume: 293 mL. Echogenicity within normal limits. No mass or hydronephrosis visualized. Left Kidney: Renal measurements: 14.2 x 2.8 x 7.4 cm = volume: 453 mL. Echogenicity within normal limits. No mass or hydronephrosis visualized. Bladder: Not visualized. Other: There is a small perihepatic ascites. IMPRESSION: 1. Unremarkable kidneys. 2. Small ascites. Electronically Signed   By: Anner Crete M.D.   On: 09/09/2021 19:48   DG CHEST PORT 1 VIEW  Result Date: 09/18/2021 CLINICAL DATA:  Shortness of breath.  T-cell lymphoma. EXAM: PORTABLE CHEST 1 VIEW COMPARISON:  Chest x-ray 09/07/2021. FINDINGS: There is a new small right pleural effusion. There is no focal lung consolidation or pneumothorax. Cardiomediastinal silhouette is within normal limits. No acute fractures are seen. Surgical clip overlies the right lower chest. There are surgical changes in the right shoulder. IMPRESSION: 1. New small right pleural effusion. Electronically Signed   By: Ronney Asters M.D.   On: 09/18/2021 19:52   [place] ASCITES (ABDOMEN LIMITED)  Result Date: 09/18/2021 CLINICAL DATA:  Abdominal ascites.  Eval for paracentesis. EXAM: LIMITED ABDOMEN ULTRASOUND FOR ASCITES TECHNIQUE: Limited ultrasound survey for ascites was performed in all four abdominal quadrants. COMPARISON:  CT AP, 09/03/2021.  US Renal, 09/17/2021. FINDINGS: Focused  sonographic evaluation of the abdomen at the RIGHT upper, LEFT upper, RIGHT lower and LEFT lower quadrants. Small volume ascites is appreciated, without a large enough volume nor safe window for paracentesis Paracentesis was performed. IMPRESSION: Small volume ascites.  No safe window for paracentesis. This examination was performed by: Soyla Dryer, NP IR. Electronically Signed   By: Michaelle Birks M.D.   On: 09/18/2021 15:24   [place] ASCITES (ABDOMEN LIMITED)  Result Date: 09/07/2021 CLINICAL DATA:  History of prostate cancer, now with bulky retroperitoneal and pelvic lymphadenopathy worrisome for lymphoma versus metastatic disease. Please perform image guided biopsy for tissue diagnostic purposes. EXAM: LIMITED ABDOMEN ULTRASOUND FOR ASCITES TECHNIQUE: Limited ultrasound survey for ascites was performed in all four abdominal quadrants. COMPARISON:  CT abdomen and pelvis-09/03/2021 FINDINGS: Sonographic evaluation of the abdomen demonstrates a trace amount of intra-abdominal ascites, too small to allow for safe ultrasound-guided paracentesis. No paracentesis attempted. IMPRESSION: Trace amount of intra-abdominal ascites, too small to allow for safe ultrasound-guided paracentesis. No paracentesis attempted. Electronically Signed   By: Sandi Mariscal M.D.   On: 09/07/2021 10:46   [place] CORE BIOPSY (LYMPH NODES)  Result Date: 09/10/2021 INDICATION: Extensive retroperitoneal adenopathy. Enlarged left inguinal lymph node. EXAM: ULTRASOUND GUIDED CORE BIOPSY OF LEFT INGUINAL ADENOPATHY MEDICATIONS: Lidocaine 1% subcutaneous ANESTHESIA/SEDATION: Intravenous Fentanyl 1106mcg and Versed $RemoveBe'2mg'qeCLpOlNN$  were administered as conscious sedation during continuous monitoring of the patient's level of consciousness and physiological / cardiorespiratory status by the radiology RN, with a total moderate sedation time of 10 minutes. PROCEDURE: The procedure, risks, benefits, and alternatives were explained to the patient. Questions regarding  the procedure were  encouraged and answered. The patient understands and consents to the procedure. Survey ultrasound of the left inguinal lesion performed. Adenopathy was localized and an appropriate skin entry site was determined and marked. The operative field was prepped with chlorhexidine in a sterile fashion, and a sterile drape was applied covering the operative field. A sterile gown and sterile gloves were used for the procedure. Local anesthesia was provided with 1% Lidocaine. Under real-time ultrasound guidance, multiple core biopsy samples of the left inguinal adenopathy obtained with the 10 cm 18 gauge automated biopsy device. These were submitted in saline to surgical pathology. Postprocedure scans show no hemorrhage or other apparent complication. The patient tolerated the procedure well. COMPLICATIONS: None immediate. FINDINGS: Left inguinal adenopathy was localized. Representative core biopsy samples obtained as above. IMPRESSION: 1. Technically successful ultrasound-guided core biopsy, left inguinal adenopathy. Electronically Signed   By: Lucrezia Europe M.D.   On: 09/10/2021 15:55   VAS [place] LOWER EXTREMITY VENOUS (DVT)  Result Date: 09/09/2021  Lower Venous DVT Study Patient Name:  HANDY MCLOUD  Date of Exam:   09/07/2021 Medical Rec #: [id number]        Accession #:    [id number] Date of Birth: [birth year]         Patient Gender: M Patient Age:   29 years Exam Location:  [facility] Procedure:      VAS [place] LOWER EXTREMITY VENOUS (DVT) Referring Phys: Annamaria Boots XU --------------------------------------------------------------------------------  Indications: Edema, abdomen distention w/ ascites, history of prostate cancer.  Limitations: Body habitus and poor ultrasound/tissue interface. Comparison Study: No prior studies. Performing Technologist: Darlin Coco RDMS, RVT  Examination Guidelines: A complete evaluation includes B-mode imaging, spectral Doppler, color Doppler, and power Doppler as needed  of all accessible portions of each vessel. Bilateral testing is considered an integral part of a complete examination. Limited examinations for reoccurring indications may be performed as noted. The reflux portion of the exam is performed with the patient in reverse Trendelenburg.  +---------+---------------+---------+-----------+----------+--------------+ RIGHT    CompressibilityPhasicitySpontaneityPropertiesThrombus Aging +---------+---------------+---------+-----------+----------+--------------+ CFV      Full           Yes      Yes                                 +---------+---------------+---------+-----------+----------+--------------+ SFJ      Full                                                        +---------+---------------+---------+-----------+----------+--------------+ FV Prox  Full                                                        +---------+---------------+---------+-----------+----------+--------------+ FV Mid   Full                                                        +---------+---------------+---------+-----------+----------+--------------+ FV DistalFull                                                        +---------+---------------+---------+-----------+----------+--------------+  PFV      Full                                                        +---------+---------------+---------+-----------+----------+--------------+ POP      Full           Yes      Yes                                 +---------+---------------+---------+-----------+----------+--------------+ PTV      Full                                                        +---------+---------------+---------+-----------+----------+--------------+ PERO     Full                                                        +---------+---------------+---------+-----------+----------+--------------+ Gastroc  Full                                                         +---------+---------------+---------+-----------+----------+--------------+   +---------+---------------+---------+-----------+----------+-------------------+ LEFT     CompressibilityPhasicitySpontaneityPropertiesThrombus Aging      +---------+---------------+---------+-----------+----------+-------------------+ CFV      Full           Yes      Yes                                      +---------+---------------+---------+-----------+----------+-------------------+ SFJ      Full                                                             +---------+---------------+---------+-----------+----------+-------------------+ FV Prox  Full                                                             +---------+---------------+---------+-----------+----------+-------------------+ FV Mid   Full                                                             +---------+---------------+---------+-----------+----------+-------------------+ FV DistalFull                                                             +---------+---------------+---------+-----------+----------+-------------------+  PFV      Full                                                             +---------+---------------+---------+-----------+----------+-------------------+ POP      Full                                                             +---------+---------------+---------+-----------+----------+-------------------+ PTV      Full                                                             +---------+---------------+---------+-----------+----------+-------------------+ PERO     Full                                         Not well visualized +---------+---------------+---------+-----------+----------+-------------------+ Gastroc  Full                                                             +---------+---------------+---------+-----------+----------+-------------------+      Summary: RIGHT: - There is no evidence of deep vein thrombosis in the lower extremity.  - No cystic structure found in the popliteal fossa.  LEFT: - There is no evidence of deep vein thrombosis in the lower extremity. However, portions of this examination were limited- see technologist comments above.  - No cystic structure found in the popliteal fossa.  *See table(s) above for measurements and observations. Electronically signed by Orlie Pollen on 09/09/2021 at 10:17:25 AM.    Final    US THORACENTESIS ASP PLEURAL SPACE W/IMG GUIDE  Result Date: 09/07/2021 INDICATION: Shortness of breath. Right-sided pleural effusion. Request therapeutic and diagnostic thoracentesis. EXAM: ULTRASOUND GUIDED RIGHT THORACENTESIS MEDICATIONS: 1% plain lidocaine, 5 mL COMPLICATIONS: None immediate. PROCEDURE: An ultrasound guided thoracentesis was thoroughly discussed with the patient and questions answered. The benefits, risks, alternatives and complications were also discussed. The patient understands and wishes to proceed with the procedure. Written consent was obtained. Ultrasound was performed to localize and mark an adequate pocket of fluid in the right chest. The area was then prepped and draped in the normal sterile fashion. 1% Lidocaine was used for local anesthesia. Under ultrasound guidance a 6 Fr Safe-T-Centesis catheter was introduced. Thoracentesis was performed. The catheter was removed and a dressing applied. FINDINGS: A total of approximately 1.3 L of clear yellow fluid was removed. Samples were sent to the laboratory as requested by the clinical team. IMPRESSION: Successful ultrasound guided right thoracentesis yielding 1.3 L of pleural fluid. Read by: Ascencion Dike PA-C Electronically Signed   By: Sandi Mariscal M.D.   On: 09/07/2021 10:44  Subjective: Continues to have some shortness of breath.  Has to sit up to alleviate his symptoms  Discharge Exam: Vitals:   09/18/21 2150 09/19/21 0615 09/19/21  1501 09/19/21 1744  BP: 137/73 (!) 152/80 (!) 150/93   Pulse: (!) 108 (!) 110 (!) 104   Resp: $Remo'18 18 20   'hgFvE$ Temp: 98.2 F (36.8 C) 98.1 F (36.7 C) 98.4 F (36.9 C)   TempSrc: Oral Oral Oral   SpO2: 98% 100% 99% 97%  Weight:  (!) 155.1 kg    Height:        General: Pt is alert, awake, not in acute distress Cardiovascular: Regular, tachycardic, S1/S2 +, no rubs, no gallops Respiratory: Diminished breath sounds at right base, increased respiratory effort with conversation Abdominal: Firm, NT, distended, bowel sounds + Extremities: 1+ edema, no cyanosis    The results of significant diagnostics from this hospitalization (including imaging, microbiology, ancillary and laboratory) are listed below for reference.     Microbiology: No results found for this or any previous visit (from the past 240 hour(s)).   Labs: BNP (last 3 results) No results for input(s): BNP in the last 8760 hours. Basic Metabolic Panel: Recent Labs  Lab 09/17/21 1224 09/18/21 0616 09/19/21 0548  NA 142 136 136  K 5.0 4.5 5.0  CL 108 104 106  CO2 20* 20* 19*  GLUCOSE 96 90 89  BUN 41* 43* 49*  CREATININE 4.23* 4.37* 4.95*  CALCIUM 9.5 8.6* 8.4*  PHOS 6.3*  --  6.3*   Liver Function Tests: Recent Labs  Lab 09/17/21 1224 09/18/21 0616 09/19/21 0548  AST 19 19  --   ALT 27 24  --   ALKPHOS 71 58  --   BILITOT 0.5 0.7  --   PROT 6.8 6.6  --   ALBUMIN 3.3* 3.5 3.4*   No results for input(s): LIPASE, AMYLASE in the last 168 hours. No results for input(s): AMMONIA in the last 168 hours. CBC: Recent Labs  Lab 09/17/21 1224 09/18/21 0616  WBC 8.2 8.9  NEUTROABS 6.1  --   HGB 11.9* 11.5*  HCT 35.2* 36.4*  MCV 80.0 86.1  PLT 364 336   Cardiac Enzymes: Recent Labs  Lab 09/17/21 1753  CKTOTAL 111   BNP: Invalid input(s): POCBNP CBG: No results for input(s): GLUCAP in the last 168 hours. D-Dimer No results for input(s): DDIMER in the last 72 hours. Hgb A1c No results for input(s):  HGBA1C in the last 72 hours. Lipid Profile No results for input(s): CHOL, HDL, LDLCALC, TRIG, CHOLHDL, LDLDIRECT in the last 72 hours. Thyroid function studies No results for input(s): TSH, T4TOTAL, T3FREE, THYROIDAB in the last 72 hours.  Invalid input(s): FREET3 Anemia work up No results for input(s): VITAMINB12, FOLATE, FERRITIN, TIBC, IRON, RETICCTPCT in the last 72 hours. Urinalysis    Component Value Date/Time   COLORURINE YELLOW 09/19/2021 1124   APPEARANCEUR CLEAR 09/19/2021 1124   LABSPEC 1.010 09/19/2021 1124   PHURINE 5.0 09/19/2021 1124   GLUCOSEU NEGATIVE 09/19/2021 1124   HGBUR MODERATE (A) 09/19/2021 1124   BILIRUBINUR NEGATIVE 09/19/2021 1124   KETONESUR NEGATIVE 09/19/2021 1124   PROTEINUR 30 (A) 09/19/2021 1124   NITRITE NEGATIVE 09/19/2021 1124   LEUKOCYTESUR NEGATIVE 09/19/2021 1124   Sepsis Labs Invalid input(s): PROCALCITONIN,  WBC,  LACTICIDVEN Microbiology No results found for this or any previous visit (from the past 240 hour(s)).   Time coordinating discharge: 30mins  SIGNED:   Kathie Dike, MD  [facility] 09/19/2021, 6:19  PM   If 7PM-7AM, please contact night-coverage [email or web address]

## 2021-09-19 NOTE — Consult Note (Signed)
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  Patient had a creatinine of 1.3 in June of this year and then at the time of this presentation creatinine was 2.6 on 11/25 and has progressed to 4.95 this morning.  Urine analysis on 11/25 with no hemoglobin, protein, leukocytes.  More recent imaging, renal ultrasound on 12/6 with normal to generous sized kidneys with no evidence of obstruction or hydronephrosis.  The patient has not used any NSAIDs.  No recent antibiotics.  Accurate I's and O's are not being reported but he reports no significant change in urine output.  He does have diarrhea several times a day but is able to keep up liquid and solid intake and does not feel thirsty or dehydrated.  Patient received rasburicase 09/17/2021, uric acid was 12.3 with a potassium of 5.0, bicarbonate of 20, calcium of 9.5, phosphorus of 6.3.  Creatinine (mg/dL)  Date Value  09/17/2021 4.23 (HH)   Creatinine, Ser (mg/dL)  Date Value  09/19/2021 4.95 (H)  09/18/2021 4.37 (H)  09/10/2021 2.64 (H)  09/09/2021 3.04 (H)  09/08/2021 3.12 (H)  09/07/2021 3.03 (H)  09/06/2021 2.61 (H)  04/01/2021 1.35 (H)  02/25/2021 1.30 (H)  03/06/2018 1.14  ] I/Os: Not quantified fully  ROS NSAIDS: No identified exposure IV Contrast received contrast on 11/22 and 11/23 but timing of creatinine changes not consistent with contrast nephropathy TMP/SMX no exposure Hypotension not present Balance of 12 systems is negative w/ exceptions as above  PMH  Past Medical History:   Diagnosis Date   Ascites    Asthma as child   hx of   Cancer ([place])    prostate - psa 3.93 on 07/06/2012   GERD (gastroesophageal reflux disease)    hx of   H/O hiatal hernia 15 years ago   Hypertension    Prostate cancer ([place]) 08/09/2012   Gleason 3+3=6, vol 20-25 gm   Prostate cancer ([place]) 01/10/2013   Seed implant to be done due to Robotic surgery aborted   Sleep apnea    CPAP   PSH  Past Surgical History:  Procedure Laterality Date   CARPAL TUNNEL RELEASE Right 04/03/2021   Procedure: RIGHT CARPAL TUNNEL RELEASE;  Surgeon: Leandrew Koyanagi, MD;  Location: [place];  Service: Orthopedics;  Laterality: Right;   HERNIA REPAIR  as child   KNEE SURGERY Left as child   tore a ligament    MASS EXCISION Right 03/07/2021   Procedure: EXCISION MASS OF RIGHT CHEST;  Surgeon: Johnathan Hausen, MD;  [facility];  Service: General;  Laterality: Right;   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   RADIOACTIVE SEED IMPLANT N/A 01/17/2013   Procedure: RADIOACTIVE SEED IMPLANT;  Surgeon: Bernestine Amass, MD;  [facility];  Service: Urology;  Laterality: N/A;  W/ MURRAY    ROBOT ASSISTED LAPAROSCOPIC RADICAL PROSTATECTOMY N/A 11/24/2012   Procedure: Attempted ROBOTIC ASSISTED LAPAROSCOPIC RADICAL PROSTATECTOMY, Abandoned;  Surgeon: Bernestine Amass, MD;  [facility];  Service: Urology;  Laterality: N/A;   scrotum explotion Left    SHOULDER ARTHROSCOPY WITH ROTATOR CUFF REPAIR  AND SUBACROMIAL DECOMPRESSION Right 04/03/2021   Procedure: RIGHT SHOULDER ARTHROSCOPY ROTATOR CUFF REPAIR, SUBACROMIAL DECOMPRESSION, EXTENSIVE DEBRIDEMENT;  Surgeon: Leandrew Koyanagi, MD;  Location: [place];  Service: Orthopedics;  Laterality: Right;   testical growth removed     VASECTOMY     FH  Family History  Problem Relation Age of Onset   Cancer Maternal Aunt        colon   Cancer Paternal Uncle        prostate, 2 uncles   Cancer Daughter        angiosarcoma age 37   Prostate cancer Other    SH  reports  that he has never smoked. He has never used smokeless tobacco. He reports that he does not drink alcohol and does not use drugs. Allergies No Known Allergies Home medications Prior to Admission medications   Medication Sig Start Date End Date Taking? Authorizing Provider  Multiple Vitamins-Minerals (MULTIVITAMIN WITH MINERALS) tablet Take 1 tablet by mouth daily.   Yes [provider]  oxyCODONE (OXY IR/ROXICODONE) 5 MG immediate release tablet Take 1 tablet (5 mg total) by mouth every 6 (six) hours as needed for pain. 09/17/21  Yes Lincoln Brigham, PA-C    Current Medications Scheduled Meds:  allopurinol  300 mg Oral Daily   heparin  5,000 Units Subcutaneous Q8H   Continuous Infusions:  sodium chloride 125 mL/hr at 09/19/21 0547   PRN Meds:.acetaminophen **OR** acetaminophen, albuterol, diphenhydrAMINE, ondansetron **OR** ondansetron (ZOFRAN) IV, oxyCODONE  CBC Recent Labs  Lab 09/17/21 1224 09/18/21 0616  WBC 8.2 8.9  NEUTROABS 6.1  --   HGB 11.9* 11.5*  HCT 35.2* 36.4*  MCV 80.0 86.1  PLT 364 742   Basic Metabolic Panel Recent Labs  Lab 09/17/21 1224 09/18/21 0616 09/19/21 0548  NA 142 136 136  K 5.0 4.5 5.0  CL 108 104 106  CO2 20* 20* 19*  GLUCOSE 96 90 89  BUN 41* 43* 49*  CREATININE 4.23* 4.37* 4.95*  CALCIUM 9.5 8.6* 8.4*  PHOS 6.3*  --  6.3*    Physical Exam   Blood pressure (!) 152/80, pulse (!) 110, temperature 98.1 F (36.7 C), temperature source Oral, resp. rate 18, height 5\' 11"  (1.803 m), weight (!) 155.1 kg, SpO2 100 %. GEN: Obese, NAD ENT: NCAT EYES: EOMI CV: Tachycardic, regular PULM: Clear bilaterally ABD: Protuberant, soft SKIN: No rashes or lesions EXT: 1+ lower extremity edema  Assessment 60M AKI in setting of recently diagnosed T-cell lymphoma.  No evidence of obstruction, nephrotoxin exposure (I do not think this is contrast nephropathy).  I think most likely etiology would be tumor lysis syndrome (agree with use of  rasburicase but not convinced that this is the primary cause) or infiltrative lymphomatous process within the kidney which seems very possible.  Either way therapy is directed towards the malignancy.   AKI, suspect infiltrative lymphomatous process, tumor lysis syndrome also possible but seems less likely Recent diagnosis of T-cell lymphoma, likely to need inpatient chemotherapy at tertiary care facility History of prostate cancer, no evidence of obstruction at current time Mild hyperphosphatemia, etiologies include low GFR and/or tumor lysis Mild metabolic acidosis, will monitor OSA GERD  Plan As above, I do not think that a biopsy would change management and carries significant risk, recommend proper treatment of lymphoma No indication for dialysis at the current time but would support with dialysis if indicated Strict I's and O's, discussed with nursing staff Repeat urinalysis We will follow closely Daily weights,  Daily Renal Panel, Strict I/Os, Avoid nephrotoxins (NSAIDs, judicious IV Contrast)    Rexene Agent  09/19/2021, 8:23 AM

## 2021-09-19 NOTE — Progress Notes (Signed)
Patient transferred to [facility], room 10 A 4 via [facility]. Report called to [place] at [place]. Patient stable on transfer.

## 2021-09-30 ENCOUNTER — Encounter (HOSPITAL_COMMUNITY): Payer: Self-pay

## 2021-10-22 LAB — SURGICAL PATHOLOGY

## 2021-10-24 DIAGNOSIS — C8440 Peripheral T-cell lymphoma, not classified, unspecified site: Secondary | ICD-10-CM | POA: Diagnosis present

## 2021-10-29 ENCOUNTER — Encounter (HOSPITAL_COMMUNITY): Payer: Self-pay

## 2021-11-13 DIAGNOSIS — N179 Acute kidney failure, unspecified: Secondary | ICD-10-CM | POA: Diagnosis not present

## 2021-11-13 DIAGNOSIS — T451X5A Adverse effect of antineoplastic and immunosuppressive drugs, initial encounter: Secondary | ICD-10-CM | POA: Diagnosis not present

## 2021-11-13 DIAGNOSIS — R3589 Other polyuria: Secondary | ICD-10-CM | POA: Diagnosis not present

## 2021-11-13 DIAGNOSIS — R350 Frequency of micturition: Secondary | ICD-10-CM | POA: Diagnosis not present

## 2021-11-13 DIAGNOSIS — D61810 Antineoplastic chemotherapy induced pancytopenia: Secondary | ICD-10-CM | POA: Diagnosis not present

## 2021-11-13 DIAGNOSIS — C8440 Peripheral T-cell lymphoma, not classified, unspecified site: Secondary | ICD-10-CM | POA: Diagnosis not present

## 2021-11-13 DIAGNOSIS — Z5112 Encounter for antineoplastic immunotherapy: Secondary | ICD-10-CM | POA: Diagnosis not present

## 2021-11-13 DIAGNOSIS — Z5111 Encounter for antineoplastic chemotherapy: Secondary | ICD-10-CM | POA: Diagnosis not present

## 2021-11-14 DIAGNOSIS — Z5112 Encounter for antineoplastic immunotherapy: Secondary | ICD-10-CM | POA: Diagnosis not present

## 2021-11-14 DIAGNOSIS — R3589 Other polyuria: Secondary | ICD-10-CM | POA: Diagnosis not present

## 2021-11-14 DIAGNOSIS — D61810 Antineoplastic chemotherapy induced pancytopenia: Secondary | ICD-10-CM | POA: Diagnosis not present

## 2021-11-14 DIAGNOSIS — N179 Acute kidney failure, unspecified: Secondary | ICD-10-CM | POA: Diagnosis not present

## 2021-11-14 DIAGNOSIS — T451X5A Adverse effect of antineoplastic and immunosuppressive drugs, initial encounter: Secondary | ICD-10-CM | POA: Diagnosis not present

## 2021-11-14 DIAGNOSIS — Z5111 Encounter for antineoplastic chemotherapy: Secondary | ICD-10-CM | POA: Diagnosis not present

## 2021-11-14 DIAGNOSIS — R350 Frequency of micturition: Secondary | ICD-10-CM | POA: Diagnosis not present

## 2021-11-14 DIAGNOSIS — N2889 Other specified disorders of kidney and ureter: Secondary | ICD-10-CM | POA: Diagnosis not present

## 2021-11-14 DIAGNOSIS — C8440 Peripheral T-cell lymphoma, not classified, unspecified site: Secondary | ICD-10-CM | POA: Diagnosis not present

## 2021-11-15 DIAGNOSIS — C8440 Peripheral T-cell lymphoma, not classified, unspecified site: Secondary | ICD-10-CM | POA: Diagnosis not present

## 2021-11-15 DIAGNOSIS — R3589 Other polyuria: Secondary | ICD-10-CM | POA: Diagnosis not present

## 2021-11-15 DIAGNOSIS — N179 Acute kidney failure, unspecified: Secondary | ICD-10-CM | POA: Diagnosis not present

## 2021-11-15 DIAGNOSIS — T451X5A Adverse effect of antineoplastic and immunosuppressive drugs, initial encounter: Secondary | ICD-10-CM | POA: Diagnosis not present

## 2021-11-15 DIAGNOSIS — D61810 Antineoplastic chemotherapy induced pancytopenia: Secondary | ICD-10-CM | POA: Diagnosis not present

## 2021-11-15 DIAGNOSIS — Z5111 Encounter for antineoplastic chemotherapy: Secondary | ICD-10-CM | POA: Diagnosis not present

## 2021-11-15 DIAGNOSIS — R350 Frequency of micturition: Secondary | ICD-10-CM | POA: Diagnosis not present

## 2021-11-15 DIAGNOSIS — Z5112 Encounter for antineoplastic immunotherapy: Secondary | ICD-10-CM | POA: Diagnosis not present

## 2021-11-16 DIAGNOSIS — C8440 Peripheral T-cell lymphoma, not classified, unspecified site: Secondary | ICD-10-CM | POA: Diagnosis not present

## 2021-11-16 DIAGNOSIS — N289 Disorder of kidney and ureter, unspecified: Secondary | ICD-10-CM | POA: Diagnosis not present

## 2021-11-16 DIAGNOSIS — E875 Hyperkalemia: Secondary | ICD-10-CM | POA: Diagnosis not present

## 2021-11-16 DIAGNOSIS — N179 Acute kidney failure, unspecified: Secondary | ICD-10-CM | POA: Diagnosis not present

## 2021-11-17 DIAGNOSIS — C8440 Peripheral T-cell lymphoma, not classified, unspecified site: Secondary | ICD-10-CM | POA: Diagnosis not present

## 2021-11-17 DIAGNOSIS — E875 Hyperkalemia: Secondary | ICD-10-CM | POA: Diagnosis not present

## 2021-11-17 DIAGNOSIS — N289 Disorder of kidney and ureter, unspecified: Secondary | ICD-10-CM | POA: Diagnosis not present

## 2021-11-17 DIAGNOSIS — N179 Acute kidney failure, unspecified: Secondary | ICD-10-CM | POA: Diagnosis not present

## 2021-11-17 DIAGNOSIS — R718 Other abnormality of red blood cells: Secondary | ICD-10-CM | POA: Diagnosis not present

## 2021-11-18 DIAGNOSIS — E875 Hyperkalemia: Secondary | ICD-10-CM | POA: Diagnosis not present

## 2021-11-18 DIAGNOSIS — N179 Acute kidney failure, unspecified: Secondary | ICD-10-CM | POA: Diagnosis not present

## 2021-11-18 DIAGNOSIS — C8440 Peripheral T-cell lymphoma, not classified, unspecified site: Secondary | ICD-10-CM | POA: Diagnosis not present

## 2021-11-18 DIAGNOSIS — C8590 Non-Hodgkin lymphoma, unspecified, unspecified site: Secondary | ICD-10-CM | POA: Diagnosis not present

## 2021-11-18 DIAGNOSIS — N289 Disorder of kidney and ureter, unspecified: Secondary | ICD-10-CM | POA: Diagnosis not present

## 2021-11-19 DIAGNOSIS — C8440 Peripheral T-cell lymphoma, not classified, unspecified site: Secondary | ICD-10-CM | POA: Diagnosis not present

## 2021-11-19 DIAGNOSIS — N289 Disorder of kidney and ureter, unspecified: Secondary | ICD-10-CM | POA: Diagnosis not present

## 2021-11-19 DIAGNOSIS — E875 Hyperkalemia: Secondary | ICD-10-CM | POA: Diagnosis not present

## 2021-11-19 DIAGNOSIS — N179 Acute kidney failure, unspecified: Secondary | ICD-10-CM | POA: Diagnosis not present

## 2021-11-19 DIAGNOSIS — C8449 Peripheral T-cell lymphoma, not classified, extranodal and solid organ sites: Secondary | ICD-10-CM | POA: Diagnosis not present

## 2021-11-21 DIAGNOSIS — N179 Acute kidney failure, unspecified: Secondary | ICD-10-CM | POA: Diagnosis not present

## 2021-11-22 DIAGNOSIS — N179 Acute kidney failure, unspecified: Secondary | ICD-10-CM | POA: Diagnosis not present

## 2021-11-22 DIAGNOSIS — Z20822 Contact with and (suspected) exposure to covid-19: Secondary | ICD-10-CM | POA: Diagnosis not present

## 2021-11-28 DIAGNOSIS — N179 Acute kidney failure, unspecified: Secondary | ICD-10-CM | POA: Diagnosis not present

## 2021-12-01 ENCOUNTER — Encounter (HOSPITAL_COMMUNITY): Payer: Self-pay | Admitting: Internal Medicine

## 2021-12-01 ENCOUNTER — Emergency Department (HOSPITAL_COMMUNITY): Payer: 59

## 2021-12-01 ENCOUNTER — Observation Stay (HOSPITAL_COMMUNITY)
Admission: EM | Admit: 2021-12-01 | Discharge: 2021-12-02 | Disposition: A | Discharge status: Home / Self Care | Payer: 59 | Attending: Internal Medicine | Admitting: Internal Medicine

## 2021-12-01 ENCOUNTER — Other Ambulatory Visit: Payer: Self-pay

## 2021-12-01 DIAGNOSIS — R509 Fever, unspecified: Secondary | ICD-10-CM | POA: Diagnosis not present

## 2021-12-01 DIAGNOSIS — Z79899 Other long term (current) drug therapy: Secondary | ICD-10-CM | POA: Diagnosis not present

## 2021-12-01 DIAGNOSIS — R29818 Other symptoms and signs involving the nervous system: Secondary | ICD-10-CM | POA: Diagnosis not present

## 2021-12-01 DIAGNOSIS — D649 Anemia, unspecified: Secondary | ICD-10-CM

## 2021-12-01 DIAGNOSIS — R5383 Other fatigue: Secondary | ICD-10-CM | POA: Diagnosis not present

## 2021-12-01 DIAGNOSIS — G4733 Obstructive sleep apnea (adult) (pediatric): Secondary | ICD-10-CM | POA: Diagnosis present

## 2021-12-01 DIAGNOSIS — J45909 Unspecified asthma, uncomplicated: Secondary | ICD-10-CM | POA: Insufficient documentation

## 2021-12-01 DIAGNOSIS — Z8546 Personal history of malignant neoplasm of prostate: Secondary | ICD-10-CM | POA: Diagnosis not present

## 2021-12-01 DIAGNOSIS — K625 Hemorrhage of anus and rectum: Secondary | ICD-10-CM | POA: Insufficient documentation

## 2021-12-01 DIAGNOSIS — D62 Acute posthemorrhagic anemia: Secondary | ICD-10-CM | POA: Diagnosis not present

## 2021-12-01 DIAGNOSIS — E876 Hypokalemia: Secondary | ICD-10-CM | POA: Diagnosis not present

## 2021-12-01 DIAGNOSIS — R000 Tachycardia, unspecified: Secondary | ICD-10-CM | POA: Diagnosis not present

## 2021-12-01 DIAGNOSIS — I120 Hypertensive chronic kidney disease with stage 5 chronic kidney disease or end stage renal disease: Secondary | ICD-10-CM | POA: Insufficient documentation

## 2021-12-01 DIAGNOSIS — A419 Sepsis, unspecified organism: Secondary | ICD-10-CM | POA: Diagnosis not present

## 2021-12-01 DIAGNOSIS — I10 Essential (primary) hypertension: Secondary | ICD-10-CM | POA: Diagnosis not present

## 2021-12-01 DIAGNOSIS — N186 End stage renal disease: Secondary | ICD-10-CM | POA: Insufficient documentation

## 2021-12-01 DIAGNOSIS — R9431 Abnormal electrocardiogram [ECG] [EKG]: Secondary | ICD-10-CM | POA: Diagnosis not present

## 2021-12-01 DIAGNOSIS — Z992 Dependence on renal dialysis: Secondary | ICD-10-CM

## 2021-12-01 DIAGNOSIS — Z20822 Contact with and (suspected) exposure to covid-19: Secondary | ICD-10-CM | POA: Diagnosis not present

## 2021-12-01 DIAGNOSIS — R0689 Other abnormalities of breathing: Secondary | ICD-10-CM | POA: Diagnosis not present

## 2021-12-01 DIAGNOSIS — C8440 Peripheral T-cell lymphoma, not classified, unspecified site: Secondary | ICD-10-CM | POA: Diagnosis present

## 2021-12-01 DIAGNOSIS — C8443 Peripheral T-cell lymphoma, not classified, intra-abdominal lymph nodes: Secondary | ICD-10-CM

## 2021-12-01 DIAGNOSIS — I951 Orthostatic hypotension: Secondary | ICD-10-CM

## 2021-12-01 LAB — URINALYSIS, ROUTINE W REFLEX MICROSCOPIC
Bacteria, UA: NONE SEEN
Bilirubin Urine: NEGATIVE
Glucose, UA: 150 mg/dL — AB
Hgb urine dipstick: NEGATIVE
Ketones, ur: 5 mg/dL — AB
Leukocytes,Ua: NEGATIVE
Nitrite: NEGATIVE
Protein, ur: 30 mg/dL — AB
Specific Gravity, Urine: 1.005 (ref 1.005–1.030)
pH: 8 (ref 5.0–8.0)

## 2021-12-01 LAB — COMPREHENSIVE METABOLIC PANEL
ALT: 9 U/L (ref 0–44)
AST: 13 U/L — ABNORMAL LOW (ref 15–41)
Albumin: 3.8 g/dL (ref 3.5–5.0)
Alkaline Phosphatase: 51 U/L (ref 38–126)
Anion gap: 19 — ABNORMAL HIGH (ref 5–15)
BUN: 59 mg/dL — ABNORMAL HIGH (ref 6–20)
CO2: 22 mmol/L (ref 22–32)
Calcium: 8.2 mg/dL — ABNORMAL LOW (ref 8.9–10.3)
Chloride: 92 mmol/L — ABNORMAL LOW (ref 98–111)
Creatinine, Ser: 10.41 mg/dL — ABNORMAL HIGH (ref 0.61–1.24)
GFR, Estimated: 5 mL/min — ABNORMAL LOW (ref >60)
Glucose, Bld: 98 mg/dL (ref 70–99)
Potassium: 2.6 mmol/L — CL (ref 3.5–5.1)
Sodium: 133 mmol/L — ABNORMAL LOW (ref 135–145)
Total Bilirubin: 0.6 mg/dL (ref 0.3–1.2)
Total Protein: 6.5 g/dL (ref 6.5–8.1)

## 2021-12-01 LAB — CBC WITH DIFFERENTIAL/PLATELET
Abs Immature Granulocytes: 0.36 10*3/uL — ABNORMAL HIGH (ref 0.00–0.07)
Basophils Absolute: 0.2 10*3/uL — ABNORMAL HIGH (ref 0.0–0.1)
Basophils Relative: 2 %
Eosinophils Absolute: 0.3 10*3/uL (ref 0.0–0.5)
Eosinophils Relative: 4 %
HCT: 22.7 % — ABNORMAL LOW (ref 39.0–52.0)
Hemoglobin: 7.8 g/dL — ABNORMAL LOW (ref 13.0–17.0)
Immature Granulocytes: 4 %
Lymphocytes Relative: 12 %
Lymphs Abs: 1.1 10*3/uL (ref 0.7–4.0)
MCH: 30.1 pg (ref 26.0–34.0)
MCHC: 34.4 g/dL (ref 30.0–36.0)
MCV: 87.6 fL (ref 80.0–100.0)
Monocytes Absolute: 1.4 10*3/uL — ABNORMAL HIGH (ref 0.1–1.0)
Monocytes Relative: 15 %
Neutro Abs: 5.6 10*3/uL (ref 1.7–7.7)
Neutrophils Relative %: 63 %
Platelets: 314 10*3/uL (ref 150–400)
RBC: 2.59 MIL/uL — ABNORMAL LOW (ref 4.22–5.81)
RDW: 16.4 % — ABNORMAL HIGH (ref 11.5–15.5)
WBC: 8.9 10*3/uL (ref 4.0–10.5)
nRBC: 0 % (ref 0.0–0.2)

## 2021-12-01 LAB — LACTIC ACID, PLASMA
Lactic Acid, Venous: 2.2 mmol/L (ref 0.5–1.9)
Lactic Acid, Venous: 2 mmol/L (ref 0.5–1.9)

## 2021-12-01 LAB — RESP PANEL BY RT-PCR (FLU A&B, COVID) ARPGX2
Influenza A by PCR: NEGATIVE
Influenza B by PCR: NEGATIVE
SARS Coronavirus 2 by RT PCR: NEGATIVE

## 2021-12-01 LAB — PREPARE RBC (CROSSMATCH)

## 2021-12-01 LAB — POC OCCULT BLOOD, ED: Fecal Occult Bld: POSITIVE — AB

## 2021-12-01 MED ORDER — DIPHENHYDRAMINE HCL 50 MG/ML IJ SOLN: 50.0000 mg | Freq: Four times a day (QID) | INTRAMUSCULAR | Status: DC | PRN

## 2021-12-01 MED ORDER — SODIUM CHLORIDE 0.9% IV SOLUTION: Freq: Once | INTRAVENOUS | Status: AC

## 2021-12-01 MED ORDER — POTASSIUM CHLORIDE CRYS ER 20 MEQ PO TBCR
40.0000 meq | EXTENDED_RELEASE_TABLET | Freq: Once | ORAL | Status: AC
  Administered 2021-12-01: 40 meq via ORAL
  Filled 2021-12-01: qty 2

## 2021-12-01 MED ORDER — ACETAMINOPHEN 650 MG RE SUPP: 650.0000 mg | Freq: Four times a day (QID) | RECTAL | Status: DC | PRN

## 2021-12-01 MED ORDER — ONDANSETRON HCL 4 MG/2ML IJ SOLN: 4.0000 mg | Freq: Four times a day (QID) | INTRAMUSCULAR | Status: DC | PRN

## 2021-12-01 MED ORDER — ACETAMINOPHEN 325 MG PO TABS: 650.0000 mg | ORAL_TABLET | Freq: Four times a day (QID) | ORAL | Status: DC | PRN

## 2021-12-01 MED ORDER — ONDANSETRON HCL 4 MG PO TABS: 4.0000 mg | ORAL_TABLET | Freq: Four times a day (QID) | ORAL | Status: DC | PRN

## 2021-12-01 NOTE — ED Notes (Signed)
Secretary for [facility] told this RN that a handoff would be put in for this patient shortly

## 2021-12-01 NOTE — Assessment & Plan Note (Signed)
Pt admits to having hemorrhoids. EDP performed rectal exam that reveal bright red blood when finger was withdrawn from rectum.  Pt states he sees [facility]. Order anusol suppositories.

## 2021-12-01 NOTE — ED Notes (Signed)
Patient reports sudden onset of itching.  Blood paused and provider made aware.

## 2021-12-01 NOTE — ED Triage Notes (Signed)
Patient bib [facility], patient has t cell lymphoma, dialysis patient, receives chemo every three weeks. Patient was fine this morning, laid down and became altered. Slurring speech. Patient was saying things that made no sense. Per ems, patient feels very hot. Patient reports pain in head and behind left eye stating "something is wrong in my head" ems reports patient had intermittent "twitch" while lying on bed. Pain rated 7/10 in head,behind left eye

## 2021-12-01 NOTE — Assessment & Plan Note (Signed)
chronic

## 2021-12-01 NOTE — H&P (Signed)
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  Patient states he was feeling fine yesterday after dialysis.  He denies any blood in the stools.  He states he does have hemorrhoids.  Today patient has been feeling very dizzy when standing.  He states that he went to the bathroom and had to return back to his bed very quickly.  He states that he felt like he was going to pass out when he was standing.  Denies any shortness of breath.  No chest pain.  He has had some chills today.  Denies any cough.  No upper respiratory symptoms.  Patient is planning on receiving another round of chemotherapy for his peripheral T-cell lymphoma this month at [facility].  Patient came to the ER due to having altered mental status according to the patient's aunt.  He also had a headache that comes and goes.  Arrival to the ER, temp 99.7 heart rate 90 blood pressure 138/78 satting 98% on room air  Labs showed a white count of 8.9, hemoglobin 7.8, platelets of 314  UA was negative for leukocyte esterase and nitrates.  Chemistry sodium 133 potassium 3.6 chloride of 22 bicarb of 59 creatinine 10.4  COVID and flu negative.  Initial lactic acid was 2.2.  EDP performed a rectal exam.  There is no reported stool in the rectal vault however on withdrawal of the finger, there was bright red blood on the glove.  Of note, the patient's hemoglobin was 7.8 on 11-19-2021 when he had a follow-up.  Due to patient's symptomatic anemia, [facility] contacted for admission.  EDP discussed  the patient with nephrology who ordered 1 dose of oral potassium.  Nephrology requested patient be [facility] to [facility] for admission.   ED Course: Hgb 7.8 today. Was 7.5 at [facility] on 11-22-2021. Potassium low at 2.6.  Review of Systems:  Review of Systems  Constitutional:  Positive for chills.  HENT: Negative.    Eyes: Negative.   Respiratory: Negative.    Cardiovascular: Negative.   Gastrointestinal:  Negative for blood in stool, diarrhea and melena.  Genitourinary: Negative.   Musculoskeletal: Negative.   Skin: Negative.   Neurological:  Positive for dizziness, tingling, tremors and headaches.  Endo/Heme/Allergies: Negative.   Psychiatric/Behavioral: Negative.    All other systems reviewed and are negative.  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    Past Surgical History:  Procedure Laterality Date   CARPAL TUNNEL RELEASE Right 04/03/2021   Procedure: RIGHT CARPAL TUNNEL RELEASE;  Surgeon: Leandrew Koyanagi, MD;  Location: [place];  Service: Orthopedics;  Laterality: Right;   HERNIA REPAIR  as child   KNEE SURGERY Left as child   tore a ligament    MASS EXCISION Right 03/07/2021   Procedure: EXCISION MASS OF RIGHT CHEST;  Surgeon: Johnathan Hausen, MD;  [facility];  Service: General;  Laterality: Right;   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   RADIOACTIVE SEED IMPLANT N/A 01/17/2013   Procedure: RADIOACTIVE SEED IMPLANT;  Surgeon: Bernestine Amass, MD;  [facility];  Service: Urology;  Laterality: N/A;  W/ MURRAY    ROBOT ASSISTED LAPAROSCOPIC RADICAL  PROSTATECTOMY N/A 11/24/2012   Procedure: Attempted ROBOTIC ASSISTED LAPAROSCOPIC RADICAL PROSTATECTOMY, Abandoned;  Surgeon: Bernestine Amass, MD;  [facility];  Service: Urology;  Laterality: N/A;   scrotum explotion Left    SHOULDER ARTHROSCOPY WITH ROTATOR CUFF REPAIR AND SUBACROMIAL DECOMPRESSION Right 04/03/2021   Procedure: RIGHT SHOULDER ARTHROSCOPY ROTATOR CUFF REPAIR, SUBACROMIAL DECOMPRESSION, EXTENSIVE DEBRIDEMENT;  Surgeon: Leandrew Koyanagi, MD;  Location: [place];  Service: Orthopedics;  Laterality: Right;   testical growth removed     VASECTOMY       reports that he has never smoked. He has never used smokeless tobacco. He reports that he does not drink alcohol and does not use drugs.  No Known Allergies  Family History  Problem Relation Age of Onset   Cancer Maternal Aunt        colon   Cancer Paternal Uncle        prostate, 2 uncles   Cancer Daughter        angiosarcoma age 22   Prostate cancer Other     Prior to Admission medications   Medication Sig Start Date End Date Taking? Authorizing Provider  albuterol (PROVENTIL) (2.5 MG/3ML) 0.083% nebulizer solution Take 3 mLs (2.5 mg total) by nebulization every 6 (six) hours as needed for wheezing or shortness of breath. 09/19/21   Kathie Dike, MD  allopurinol (ZYLOPRIM) 300 MG tablet Take 1 tablet (300 mg total) by mouth daily. 09/20/21   Kathie Dike, MD  diphenhydrAMINE (BENADRYL) 25 mg capsule Take 1 capsule (25 mg total) by mouth every 6 (six) hours as needed for itching. 09/19/21   Kathie Dike, MD  loperamide (IMODIUM) 2 MG capsule Take 1 capsule (2 mg total) by mouth every 6 (six) hours as needed for diarrhea or loose stools. 09/19/21   Kathie Dike, MD  Multiple Vitamins-Minerals (MULTIVITAMIN WITH MINERALS) tablet Take 1 tablet by mouth daily.    [provider]  oxyCODONE (OXY IR/ROXICODONE) 5 MG immediate release tablet Take 1 tablet (5 mg total) by mouth every 6 (six) hours as needed for pain.  09/17/21   Lincoln Brigham, PA-C    Physical Exam: Vitals:   12/01/21 1736 12/01/21 1800 12/01/21 1830 12/01/21 1900  BP: 135/77 131/81 136/80 132/70  Pulse: 90 89 86 86  Resp: 19 18 19 20   Temp:      TempSrc:      SpO2: 99% 98% 99% 99%  Weight:      Height:        Physical Exam Vitals and nursing note reviewed.  Constitutional:      General: He is not in acute distress.    Appearance: Normal appearance. He is not ill-appearing, toxic-appearing or diaphoretic.  HENT:     Head: Normocephalic and atraumatic.     Nose: Nose normal. No rhinorrhea.  Eyes:  General: No scleral icterus. Cardiovascular:     Rate and Rhythm: Normal rate and regular rhythm.     Pulses: Normal pulses.  Pulmonary:     Effort: Pulmonary effort is normal. No respiratory distress.     Breath sounds: No wheezing or rales.  Abdominal:     General: Bowel sounds are normal. There is no distension.     Tenderness: There is no abdominal tenderness. There is no guarding or rebound.  Musculoskeletal:     Right lower leg: No edema.     Left lower leg: No edema.     Comments: Left ant chest wall HD catheter  Right ant chest wall port-a-cath  Skin:    General: Skin is warm and dry.     Capillary Refill: Capillary refill takes less than 2 seconds.  Neurological:     General: No focal deficit present.     Mental Status: He is alert and oriented to person, place, and time.     Labs on Admission: I have personally reviewed following labs and imaging studies  CBC: Recent Labs  Lab 12/01/21 1655  WBC 8.9  NEUTROABS 5.6  HGB 7.8*  HCT 22.7*  MCV 87.6  PLT 166   Basic Metabolic Panel: Recent Labs  Lab 12/01/21 1655  NA 133*  K 2.6*  CL 92*  CO2 22  GLUCOSE 98  BUN 59*  CREATININE 10.41*  CALCIUM 8.2*   GFR: Estimated Creatinine Clearance: 9.5 mL/min (A) (by C-G formula based on SCr of 10.41 mg/dL (H)). Liver Function Tests: Recent Labs  Lab 12/01/21 1655  AST 13*  ALT 9  ALKPHOS 51   BILITOT 0.6  PROT 6.5  ALBUMIN 3.8   No results for input(s): LIPASE, AMYLASE in the last 168 hours. No results for input(s): AMMONIA in the last 168 hours. Coagulation Profile: No results for input(s): INR, PROTIME in the last 168 hours. Cardiac Enzymes: No results for input(s): CKTOTAL, CKMB, CKMBINDEX, TROPONINI in the last 168 hours. BNP (last 3 results) No results for input(s): PROBNP in the last 8760 hours. HbA1C: No results for input(s): HGBA1C in the last 72 hours. CBG: No results for input(s): GLUCAP in the last 168 hours. Lipid Profile: No results for input(s): CHOL, HDL, LDLCALC, TRIG, CHOLHDL, LDLDIRECT in the last 72 hours. Thyroid Function Tests: No results for input(s): TSH, T4TOTAL, FREET4, T3FREE, THYROIDAB in the last 72 hours. Anemia Panel: No results for input(s): VITAMINB12, FOLATE, FERRITIN, TIBC, IRON, RETICCTPCT in the last 72 hours. Urine analysis:    Component Value Date/Time   COLORURINE STRAW (A) 12/01/2021 1654   APPEARANCEUR CLEAR 12/01/2021 1654   LABSPEC 1.005 12/01/2021 1654   PHURINE 8.0 12/01/2021 1654   GLUCOSEU 150 (A) 12/01/2021 1654   HGBUR NEGATIVE 12/01/2021 1654   BILIRUBINUR NEGATIVE 12/01/2021 1654   KETONESUR 5 (A) 12/01/2021 1654   PROTEINUR 30 (A) 12/01/2021 1654   NITRITE NEGATIVE 12/01/2021 1654   LEUKOCYTESUR NEGATIVE 12/01/2021 1654    Radiological Exams on Admission: I have personally reviewed images DG Chest 2 View  Result Date: 12/01/2021 CLINICAL DATA:  Sepsis EXAM: CHEST - 2 VIEW COMPARISON:  Chest x-ray 09/18/2021 FINDINGS: Bilateral central venous catheters visualized with the tips near the cavoatrial junction. Heart size is normal. Mediastinum appears stable. No focal consolidation, pleural effusion or pneumothorax identified. IMPRESSION: No acute intrathoracic process identified. Electronically Signed   By: Ofilia Neas M.D.   On: 12/01/2021 17:37   CT Head Wo Contrast  Result Date: 12/01/2021 CLINICAL  DATA:  Neuro deficit, acute, stroke suspected no evidence of acute EXAM: CT HEAD WITHOUT CONTRAST TECHNIQUE: Contiguous axial images were obtained from the base of the skull through the vertex without intravenous contrast. RADIATION DOSE REDUCTION: This exam was performed according to the departmental dose-optimization program which includes automated exposure control, adjustment of the mA and/or kV according to patient size and/or use of iterative reconstruction technique. COMPARISON:  None. FINDINGS: Brain: No evidence of large vascular territory infarct or acute hemorrhage.Patchy white matter hypoattenuation, most conspicuous in the left parietal juxtacortical white matter. No mass lesion, midline shift, hydrocephalus, or visible extra-axial fluid collection Vascular: No hyperdense vessel identified. Calcific intracranial atherosclerosis. Skull: No acute fracture. Sinuses/Orbits: Clear visualized sinuses.  Unremarkable orbits. Other: No mastoid effusions. IMPRESSION: 1. No evidence of large vascular territory infarct or acute hemorrhage. 2. Patchy white matter hypoattenuation, most conspicuous in the left parietal juxtacortical white matter. Findings could represent chronic microvascular ischemic disease, but small acute white matter infarct is not excluded in the absence of priors. An MRI could further evaluate if clinically indicated. Electronically Signed   By: Margaretha Sheffield M.D.   On: 12/01/2021 17:55    EKG: I have personally reviewed EKG: NSR   Assessment/Plan Principal Problem:   Acute blood loss anemia Active Problems:   Hypokalemia   Rectal bleeding   OSA (obstructive sleep apnea)   Obesity, Class III, BMI 40-49.9 (morbid obesity) ([place])   ESRD on hemodialysis ([place]) - T, TH, Sat   Peripheral t-cell lymphoma, not elsewhere classified, unspecified site ([place])    Assessment and Plan: * Acute blood loss anemia- (present on admission) Observation telemety bed at [facility]. Transfuse 1 unit PRBC.  May need additional PRBC.  Rectal bleeding Pt admits to having hemorrhoids. EDP performed rectal exam that reveal bright red blood when finger was withdrawn from rectum.  Pt states he sees [facility]. Order anusol suppositories.  Hypokalemia EDP discussed with nephrology. EDP ordered po kcl replacement.  ESRD on hemodialysis ([place]) - T, TH, Sat Had HD yesterday. Nephrology aware of pt's admission.  Obesity, Class III, BMI 40-49.9 (morbid obesity) ([place])- (present on admission) chronic  OSA (obstructive sleep apnea)- (present on admission) Chronic.  Peripheral t-cell lymphoma, not elsewhere classified, unspecified site [facility])- (present on admission) Pt follows with [place]. Had admission in 09-2022 at [facility] for tumor lysis syndrome. Was transferred to [facility] for inpatient treatment of his lymphoma.    DVT prophylaxis: SCDs Code Status: Full Code Family Communication: discussed with pt and his aunt Hoyle Sauer at bedside  Disposition Plan: return home  Consults called: EDP discussed case with nephrology  Admission status: Observation, Telemetry bed   Kristopher Oppenheim, DO [facility] 12/01/2021, 7:47 PM

## 2021-12-01 NOTE — Assessment & Plan Note (Signed)
Observation telemety bed at [facility]. Transfuse 1 unit PRBC. May need additional PRBC.

## 2021-12-01 NOTE — Assessment & Plan Note (Signed)
Had HD yesterday. Nephrology aware of pt's admission.

## 2021-12-01 NOTE — ED Provider Notes (Signed)
[facility] DEPT Provider Note   CSN: [id number] Arrival date & time: 12/01/21  1628     History  Chief Complaint  Patient presents with   Fatigue   Altered Mental Status    Kenneth Wiggins is a 61 y.o. male.  HPI The patient presents for evaluation of dizziness which he first noticed around 2 PM after he awoke from resting and napping on a couch.  He dialyzed yesterday per usual.  He describes the dizziness as a lightheaded feeling when he tries to stand up and walk.  He also noticed some pain in the right side of his head, today.  Apparently when EMS arrived, they were concerned about sepsis.  The patient feels like he was confused when EMS arrived to his home.  He states he does not feel confused now.  He is doing his usual dialysis treatments, Tuesday, Thursday and Saturday, completed the last 1 yesterday.  He is receiving chemotherapy for abdominal lymphoma, at [facility].  He is in between treatments, currently.  Next treatment is in about 10 days.  He denies fever, cough, shortness of breath, nausea or vomiting.  He has been eating reasonably well.  He denies abdominal pain.  He is not taking narcotic pain relievers.    Home Medications Prior to Admission medications   Medication Sig Start Date End Date Taking? Authorizing Provider  albuterol (PROVENTIL) (2.5 MG/3ML) 0.083% nebulizer solution Take 3 mLs (2.5 mg total) by nebulization every 6 (six) hours as needed for wheezing or shortness of breath. 09/19/21   Kathie Dike, MD  allopurinol (ZYLOPRIM) 300 MG tablet Take 1 tablet (300 mg total) by mouth daily. 09/20/21   Kathie Dike, MD  diphenhydrAMINE (BENADRYL) 25 mg capsule Take 1 capsule (25 mg total) by mouth every 6 (six) hours as needed for itching. 09/19/21   Kathie Dike, MD  loperamide (IMODIUM) 2 MG capsule Take 1 capsule (2 mg total) by mouth every 6 (six) hours as needed for diarrhea or loose stools. 09/19/21   Kathie Dike, MD   Multiple Vitamins-Minerals (MULTIVITAMIN WITH MINERALS) tablet Take 1 tablet by mouth daily.    [provider]  oxyCODONE (OXY IR/ROXICODONE) 5 MG immediate release tablet Take 1 tablet (5 mg total) by mouth every 6 (six) hours as needed for pain. 09/17/21   Lincoln Brigham, PA-C      Allergies    Patient has no known allergies.    Review of Systems   Review of Systems  Physical Exam Updated Vital Signs BP 132/70    Pulse 86    Temp 99.7 F (37.6 C) (Rectal)    Resp 20    Ht 5\' 11"  (1.803 m)    Wt 108.9 kg    SpO2 99%    BMI 33.47 kg/m  Physical Exam Vitals and nursing note reviewed.  Constitutional:      General: He is not in acute distress.    Appearance: He is well-developed. He is not ill-appearing, toxic-appearing or diaphoretic.  HENT:     Head: Normocephalic and atraumatic.     Right Ear: External ear normal.     Left Ear: External ear normal.  Eyes:     Conjunctiva/sclera: Conjunctivae normal.     Pupils: Pupils are equal, round, and reactive to light.  Neck:     Trachea: Phonation normal.  Cardiovascular:     Rate and Rhythm: Normal rate and regular rhythm.     Heart sounds: Normal heart sounds.  Pulmonary:     Effort: Pulmonary effort is normal.     Breath sounds: Normal breath sounds.  Abdominal:     General: There is no distension.     Palpations: Abdomen is soft.     Tenderness: There is no abdominal tenderness.  Genitourinary:    Comments: Anus with possible old hemorrhoidal tissue.  No acute hemorrhoids or visualized external bleeding.  On digital examination there is no stool in the rectal vault.  There is bright red blood on the examining finger.  There is no pain or tenderness on digital anal exam. Musculoskeletal:        General: Normal range of motion.     Cervical back: Normal range of motion and neck supple.     Comments: Normal strength and motion, arms and legs bilaterally.  Skin:    General: Skin is warm and dry.  Neurological:      Mental Status: He is alert and oriented to person, place, and time.     Cranial Nerves: No cranial nerve deficit.     Sensory: No sensory deficit.     Motor: No abnormal muscle tone.     Coordination: Coordination normal.     Comments: No dysarthria, aphasia or nystagmus.  Psychiatric:        Mood and Affect: Mood normal.        Behavior: Behavior normal.        Thought Content: Thought content normal.        Judgment: Judgment normal.    ED Results / Procedures / Treatments   Labs (all labs ordered are listed, but only abnormal results are displayed) Labs Reviewed  COMPREHENSIVE METABOLIC PANEL - Abnormal; Notable for the following components:      Result Value   Sodium 133 (*)    Potassium 2.6 (*)    Chloride 92 (*)    BUN 59 (*)    Creatinine, Ser 10.41 (*)    Calcium 8.2 (*)    AST 13 (*)    GFR, Estimated 5 (*)    Anion gap 19 (*)    All other components within normal limits  LACTIC ACID, PLASMA - Abnormal; Notable for the following components:   Lactic Acid, Venous 2.2 (*)    All other components within normal limits  CBC WITH DIFFERENTIAL/PLATELET - Abnormal; Notable for the following components:   RBC 2.59 (*)    Hemoglobin 7.8 (*)    HCT 22.7 (*)    RDW 16.4 (*)    Monocytes Absolute 1.4 (*)    Basophils Absolute 0.2 (*)    Abs Immature Granulocytes 0.36 (*)    All other components within normal limits  URINALYSIS, ROUTINE W REFLEX MICROSCOPIC - Abnormal; Notable for the following components:   Color, Urine STRAW (*)    Glucose, UA 150 (*)    Ketones, ur 5 (*)    Protein, ur 30 (*)    All other components within normal limits  POC OCCULT BLOOD, ED - Abnormal; Notable for the following components:   Fecal Occult Bld POSITIVE (*)    All other components within normal limits  RESP PANEL BY RT-PCR (FLU A&B, COVID) ARPGX2  CULTURE, BLOOD (ROUTINE X 2)  CULTURE, BLOOD (ROUTINE X 2)  LACTIC ACID, PLASMA  PROTIME-INR  OCCULT BLOOD X 1 CARD TO LAB, STOOL  TYPE  AND SCREEN  PREPARE RBC (CROSSMATCH)    EKG EKG Interpretation  Date/Time:  Sunday December 01 2021 17:37:16 EST Ventricular Rate:  89 PR Interval:  193 QRS Duration: 125 QT Interval:  403 QTC Calculation: 491 R Axis:   215 Text Interpretation: Right and left arm electrode reversal, interpretation assumes no reversal Sinus rhythm Nonspecific intraventricular conduction delay Repol abnrm suggests ischemia, lateral leads Since last tracing QT has lengthened Otherwise no significant change Confirmed by Daleen Bo [id number] on 12/01/2021 5:52:37 PM  Radiology DG Chest 2 View  Result Date: 12/01/2021 CLINICAL DATA:  Sepsis EXAM: CHEST - 2 VIEW COMPARISON:  Chest x-ray 09/18/2021 FINDINGS: Bilateral central venous catheters visualized with the tips near the cavoatrial junction. Heart size is normal. Mediastinum appears stable. No focal consolidation, pleural effusion or pneumothorax identified. IMPRESSION: No acute intrathoracic process identified. Electronically Signed   By: Ofilia Neas M.D.   On: 12/01/2021 17:37   CT Head Wo Contrast  Result Date: 12/01/2021 CLINICAL DATA:  Neuro deficit, acute, stroke suspected no evidence of acute EXAM: CT HEAD WITHOUT CONTRAST TECHNIQUE: Contiguous axial images were obtained from the base of the skull through the vertex without intravenous contrast. RADIATION DOSE REDUCTION: This exam was performed according to the departmental dose-optimization program which includes automated exposure control, adjustment of the mA and/or kV according to patient size and/or use of iterative reconstruction technique. COMPARISON:  None. FINDINGS: Brain: No evidence of large vascular territory infarct or acute hemorrhage.Patchy white matter hypoattenuation, most conspicuous in the left parietal juxtacortical white matter. No mass lesion, midline shift, hydrocephalus, or visible extra-axial fluid collection Vascular: No hyperdense vessel identified. Calcific intracranial  atherosclerosis. Skull: No acute fracture. Sinuses/Orbits: Clear visualized sinuses.  Unremarkable orbits. Other: No mastoid effusions. IMPRESSION: 1. No evidence of large vascular territory infarct or acute hemorrhage. 2. Patchy white matter hypoattenuation, most conspicuous in the left parietal juxtacortical white matter. Findings could represent chronic microvascular ischemic disease, but small acute white matter infarct is not excluded in the absence of priors. An MRI could further evaluate if clinically indicated. Electronically Signed   By: Margaretha Sheffield M.D.   On: 12/01/2021 17:55    Procedures Procedures    Medications Ordered in ED Medications  0.9 %  sodium chloride infusion (Manually program via Guardrails IV Fluids) (has no administration in time range)  potassium chloride SA (KLOR-CON M) CR tablet 40 mEq (has no administration in time range)    ED Course/ Medical Decision Making/ A&P Clinical Course as of 12/01/21 1914  Nancy Fetter Dec 01, 2021  1850 Patient is agreeable to transfusion.  His aunt who helps him with his medical care is at the bedside.  Patient understands he will have to be placed at [facility] in case there is a need for dialysis. [EW]  1851 Patient has not yet been transfused.  I talked to blood bank, and requested that the blood to transfuse be irradiated.  The technician stated that she would do that. [EW]    Clinical Course User Index [EW] Daleen Bo, MD                           Medical Decision Making Patient with lymphoma and end-stage renal disease presenting with dizziness that he describes as a lightheaded feeling when standing.  Most likely cause for this would be hypovolemia.  Will screen for acute infectious processes, metabolic disorders or hemodynamic disorders can cause dizziness.  On initial evaluation, there is no evidence for acute stroke syndrome.  Will order head CT, as a screen for occult CNS disorder.  Amount and/or Complexity of  Data  Reviewed Independent Historian:     Details: He is a cogent historian External Data Reviewed: labs, radiology and notes.    Details: Recent treatment at [facility]: Kidney biopsy on 11/22/2021.  Evaluation for chemotherapy on 10/29/2021.  Chemotherapy held at that time because of hyperkalemia.  He apparently was treated with [facility] and discharged. Labs: ordered.    Details: CBC, metabolic panel, viral panel: Normal except sodium low, potassium low, chloride low, BUN high, creatinine high, calcium low, AST low, GFR low, anion gap high, lactic acid mild elevation, hemoglobin low, urine with glucose and ketones and protein but no blood.  Fecal occult blood test positive. Radiology: ordered and independent interpretation performed.    Details: CT head-no acute abnormalities ECG/medicine tests: ordered and independent interpretation performed.    Details: Cardiac monitor-normal sinus rhythm Discussion of management or test interpretation with external provider(s): The case was discussed with Dr. Pearson Grippe, nephrologist.  He agrees with need for hospitalization, blood transfusion.  He recommends transfer to [facility].  Case discussed with Dr. Bridgett Larsson, hospitalist who will admit the patient.  Risk Prescription drug management. Decision regarding hospitalization. Risk Details: Patient with dizziness, described as lightheadedness, with finding for significant acute blood loss.  On 09/18/2021, hemoglobin level was 11.5.  Patient with incidental low potassium, following dialysis yesterday.  Patient treated with 40 mEq of potassium, at recommendation of nephrology.  Patient will be admitted to medical service and transferred to [facility] for evaluation.  Patient is at risk for decompensation due to multiple factors including rapid blood loss, hypokalemia and end-stage renal disease.  He requires hospitalization.  No immediate indication for surgery  Critical Care Total time providing critical care:  30-74 minutes          Final Clinical Impression(s) / ED Diagnoses Final diagnoses:  Anemia, unspecified type  Hypokalemia  End stage renal disease ([place])  Rectal bleeding    Rx / DC Orders ED Discharge Orders     None         Daleen Bo, MD 12/01/21 1914

## 2021-12-01 NOTE — Assessment & Plan Note (Signed)
Chronic. 

## 2021-12-01 NOTE — Assessment & Plan Note (Signed)
EDP discussed with nephrology. EDP ordered po kcl replacement.

## 2021-12-01 NOTE — ED Notes (Signed)
Pt denies hives, SOB, chest pain, back pain. Blount, NP, does not suspect a blood reaction and states he is okay to transfuse

## 2021-12-01 NOTE — Subjective & Objective (Signed)
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  Patient states he was feeling fine yesterday after dialysis.  He denies any blood in the stools.  He states he does have hemorrhoids.  Today patient has been feeling very dizzy when standing.  He states that he went to the bathroom and had to return back to his bed very quickly.  He states that he felt like he was going to pass out when he was standing.  Denies any shortness of breath.  No chest pain.  He has had some chills today.  Denies any cough.  No upper respiratory symptoms.  Patient is planning on receiving another round of chemotherapy for his peripheral T-cell lymphoma this month at [facility].  Patient came to the ER due to having altered mental status according to the patient's aunt.  He also had a headache that comes and goes.  Arrival to the ER, temp 99.7 heart rate 90 blood pressure 138/78 satting 98% on room air  Labs showed a white count of 8.9, hemoglobin 7.8, platelets of 314  UA was negative for leukocyte esterase and nitrates.  Chemistry sodium 133 potassium 3.6 chloride of 22 bicarb of 59 creatinine 10.4  COVID and flu negative.  Initial lactic acid was 2.2.  EDP performed a rectal exam.  There is no reported stool in the rectal vault however on withdrawal of the finger, there was bright red blood on the glove.  Of note, the patient's hemoglobin was 7.8 on 11-19-2021 when he had a follow-up.  Due to patient's symptomatic anemia, [facility] hospitalist contacted for admission.  EDP discussed the patient with nephrology who ordered 1 dose of oral potassium.  Nephrology requested patient be transferred to [facility] for admission.

## 2021-12-01 NOTE — Assessment & Plan Note (Signed)
Pt follows with [facility]. 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 Was transferred to [facility] for inpatient treatment of his lymphoma.

## 2021-12-02 ENCOUNTER — Encounter (HOSPITAL_COMMUNITY): Payer: Self-pay | Admitting: Internal Medicine

## 2021-12-02 DIAGNOSIS — D62 Acute posthemorrhagic anemia: Secondary | ICD-10-CM | POA: Diagnosis not present

## 2021-12-02 LAB — TYPE AND SCREEN
ABO/RH(D): O POS
ABO/RH(D): O POS
Antibody Screen: NEGATIVE
Antibody Screen: NEGATIVE
Unit division: 0

## 2021-12-02 LAB — BLOOD CULTURE ID PANEL (REFLEXED) - BCID2

## 2021-12-02 LAB — CBC WITH DIFFERENTIAL/PLATELET
Abs Immature Granulocytes: 0.31 10*3/uL — ABNORMAL HIGH (ref 0.00–0.07)
Basophils Absolute: 0.2 10*3/uL — ABNORMAL HIGH (ref 0.0–0.1)
Basophils Relative: 2 %
Eosinophils Absolute: 0.4 10*3/uL (ref 0.0–0.5)
Eosinophils Relative: 5 %
HCT: 23.8 % — ABNORMAL LOW (ref 39.0–52.0)
Hemoglobin: 8.3 g/dL — ABNORMAL LOW (ref 13.0–17.0)
Immature Granulocytes: 4 %
Lymphocytes Relative: 11 %
Lymphs Abs: 0.9 10*3/uL (ref 0.7–4.0)
MCH: 30.4 pg (ref 26.0–34.0)
MCHC: 34.9 g/dL (ref 30.0–36.0)
MCV: 87.2 fL (ref 80.0–100.0)
Monocytes Absolute: 1.2 10*3/uL — ABNORMAL HIGH (ref 0.1–1.0)
Monocytes Relative: 15 %
Neutro Abs: 5.2 10*3/uL (ref 1.7–7.7)
Neutrophils Relative %: 63 %
Platelets: 247 10*3/uL (ref 150–400)
RBC: 2.73 MIL/uL — ABNORMAL LOW (ref 4.22–5.81)
RDW: 16.1 % — ABNORMAL HIGH (ref 11.5–15.5)
WBC: 8.2 10*3/uL (ref 4.0–10.5)
nRBC: 0 % (ref 0.0–0.2)

## 2021-12-02 LAB — BASIC METABOLIC PANEL
Anion gap: 19 — ABNORMAL HIGH (ref 5–15)
BUN: 64 mg/dL — ABNORMAL HIGH (ref 6–20)
CO2: 21 mmol/L — ABNORMAL LOW (ref 22–32)
Calcium: 8.2 mg/dL — ABNORMAL LOW (ref 8.9–10.3)
Chloride: 94 mmol/L — ABNORMAL LOW (ref 98–111)
Creatinine, Ser: 11.4 mg/dL — ABNORMAL HIGH (ref 0.61–1.24)
GFR, Estimated: 5 mL/min — ABNORMAL LOW (ref >60)
Glucose, Bld: 97 mg/dL (ref 70–99)
Potassium: 2.9 mmol/L — ABNORMAL LOW (ref 3.5–5.1)
Sodium: 134 mmol/L — ABNORMAL LOW (ref 135–145)

## 2021-12-02 LAB — COMPREHENSIVE METABOLIC PANEL
ALT: 10 U/L (ref 0–44)
AST: 15 U/L (ref 15–41)
Albumin: 3.5 g/dL (ref 3.5–5.0)
Alkaline Phosphatase: 52 U/L (ref 38–126)
Anion gap: 20 — ABNORMAL HIGH (ref 5–15)
BUN: 60 mg/dL — ABNORMAL HIGH (ref 6–20)
CO2: 21 mmol/L — ABNORMAL LOW (ref 22–32)
Calcium: 8.2 mg/dL — ABNORMAL LOW (ref 8.9–10.3)
Chloride: 93 mmol/L — ABNORMAL LOW (ref 98–111)
Creatinine, Ser: 11.03 mg/dL — ABNORMAL HIGH (ref 0.61–1.24)
GFR, Estimated: 5 mL/min — ABNORMAL LOW (ref >60)
Glucose, Bld: 90 mg/dL (ref 70–99)
Potassium: 2.7 mmol/L — CL (ref 3.5–5.1)
Sodium: 134 mmol/L — ABNORMAL LOW (ref 135–145)
Total Bilirubin: 0.6 mg/dL (ref 0.3–1.2)
Total Protein: 6.4 g/dL — ABNORMAL LOW (ref 6.5–8.1)

## 2021-12-02 LAB — BPAM RBC
Blood Product Expiration Date: 202303192359
ISSUE DATE / TIME: 202302192029
Unit Type and Rh: 5100

## 2021-12-02 LAB — IRON AND TIBC
Iron: 27 ug/dL — ABNORMAL LOW (ref 45–182)
Saturation Ratios: 11 % — ABNORMAL LOW (ref 17.9–39.5)
TIBC: 241 ug/dL — ABNORMAL LOW (ref 250–450)
UIBC: 214 ug/dL

## 2021-12-02 LAB — MAGNESIUM: Magnesium: 1.7 mg/dL (ref 1.7–2.4)

## 2021-12-02 MED ORDER — PANTOPRAZOLE SODIUM 40 MG PO TBEC
40.0000 mg | DELAYED_RELEASE_TABLET | Freq: Every day | ORAL | Status: DC
  Administered 2021-12-02: 40 mg via ORAL
  Filled 2021-12-02: qty 1

## 2021-12-02 MED ORDER — TRAZODONE HCL 50 MG PO TABS: 50.0000 mg | ORAL_TABLET | Freq: Every day | ORAL | Status: DC

## 2021-12-02 MED ORDER — FOLIC ACID 1 MG PO TABS
1.0000 mg | ORAL_TABLET | Freq: Every day | ORAL | Status: DC
  Administered 2021-12-02: 1 mg via ORAL
  Filled 2021-12-02: qty 1

## 2021-12-02 MED ORDER — POTASSIUM CHLORIDE CRYS ER 20 MEQ PO TBCR
40.0000 meq | EXTENDED_RELEASE_TABLET | Freq: Once | ORAL | Status: AC
  Administered 2021-12-02: 40 meq via ORAL
  Filled 2021-12-02: qty 2

## 2021-12-02 MED ORDER — POTASSIUM CHLORIDE ER 10 MEQ PO TBCR
20.0000 meq | EXTENDED_RELEASE_TABLET | Freq: Two times a day (BID) | ORAL | Status: DC
  Administered 2021-12-02: 20 meq via ORAL
  Filled 2021-12-02 (×2): qty 2

## 2021-12-02 MED ORDER — HYDROXYZINE HCL 10 MG PO TABS: 25.0000 mg | ORAL_TABLET | Freq: Four times a day (QID) | ORAL | Status: DC | PRN

## 2021-12-02 MED ORDER — SODIUM BICARBONATE 650 MG PO TABS
1300.0000 mg | ORAL_TABLET | Freq: Three times a day (TID) | ORAL | Status: DC
  Administered 2021-12-02: 1300 mg via ORAL
  Filled 2021-12-02: qty 2

## 2021-12-02 MED ORDER — LORATADINE 10 MG PO TABS
10.0000 mg | ORAL_TABLET | Freq: Every day | ORAL | Status: DC
  Administered 2021-12-02: 10 mg via ORAL
  Filled 2021-12-02: qty 1

## 2021-12-02 MED ORDER — ACYCLOVIR 200 MG PO CAPS
200.0000 mg | ORAL_CAPSULE | Freq: Two times a day (BID) | ORAL | Status: DC
  Administered 2021-12-02: 200 mg via ORAL
  Filled 2021-12-02 (×2): qty 1

## 2021-12-02 NOTE — Progress Notes (Addendum)
Renal Quick Note:  Aware that [name] was admitted OBS status with dizziness/AMS/symptomatic anemia. S/p 1 PRBCs. Today - says he feels 100% better. Labs with ongoing AKI (Kid Bx at [facility] 2/10 - c/w lymphoma involvement of the kidneys) and also noted to have hypokalemia. Was given Kcl 78mEq yesterday, repeat K today still low so dosed again.   He feels well and is hoping to be discharged later today.   Assuming that his repeat K is a little better - don't see any reason why not.  Recs: - Repeat K level. If 3.5 or better, can go home with Rx for KCl 24mEq daily. - We will monitor this at his outpatient dialysis and adjust his dialysate if needed. - If K still low, would give another 62mEq and repeat in 4 hours. - If still here tomorrow, can dialyze him at that time.    Veneta Penton, PA-C [facility] Pager [phone]   Addendum: Repeat K 2.9 - being discharged home on Kcl 68mEq BID. Will have HD unit check K q HD for now and change to 4K bath. [place]

## 2021-12-02 NOTE — Discharge Summary (Signed)
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   Patient found to have anemia hemoglobin 7.8 baseline around 11-12 as well as orthostatic hypotension, improved with blood transfusion and fluids.  Patient's symptoms have now resolved otherwise stable and agreeable for discharge home.  Patient has notable small hemorrhoidal bleed which is chronic for him, in the setting of iron deficiency likely small chronic bleed and dialysis he will likely need further evaluation with nephrology for iron supplementation as well as likely EPO/similar in the near future should he continue to have ongoing complicated multifactorial anemia.  Discharge Diagnoses:  Principal Problem:   Acute blood loss anemia Active Problems:   OSA (obstructive sleep apnea)   Obesity, Class III, BMI 40-49.9 (morbid obesity) (HCC)   Hypokalemia   Rectal bleeding   Peripheral t-cell lymphoma, not elsewhere classified, unspecified site ([place])   ESRD on hemodialysis ([place]) - T, TH, Sat    Discharge Instructions  Discharge Instructions     Discharge patient   Complete by: As directed    Discharge disposition: 01-Home or Self Care   Discharge patient date: 12/02/2021      Allergies as of 12/02/2021   No Known Allergies      Medication List     STOP taking these medications    amLODipine 5 MG  tablet Commonly known as: NORVASC       TAKE these medications    acyclovir 200 MG capsule Commonly known as: ZOVIRAX Take 200 mg by mouth every 12 (twelve) hours.   calcium carbonate 750 MG chewable tablet Commonly known as: TUMS EX Chew 1 tablet by mouth 2 (two) times daily.   carvedilol 12.5 MG tablet Commonly known as: COREG Take 12.5 mg by mouth every 12 (twelve) hours.   folic acid 1 MG tablet Commonly known as: FOLVITE Take 1 mg by mouth daily.   hydrOXYzine 25 MG capsule Commonly known as: VISTARIL Take 25 mg by mouth 4 (four) times daily as needed for itching.   lidocaine-prilocaine cream Commonly known as: EMLA Apply 1 application topically as directed. Access port   loratadine 10 MG tablet Commonly known as: CLARITIN Take 10 mg by mouth daily.   pantoprazole 40 MG tablet Commonly known as: PROTONIX Take 40 mg by mouth daily.   Potassium Chloride ER 20 MEQ Tbcr Take 1 tablet by mouth 2 (two) times daily.   Sarna lotion Generic drug: camphor-menthol 1 application 3 (three) times daily as needed for itching.   sodium bicarbonate 650 MG tablet Take 1,300 mg by mouth 3 (three) times daily.   traZODone 50 MG tablet Commonly known as: DESYREL Take 50 mg by mouth at bedtime.        No Known Allergies  Consultations: None   Procedures/Studies: DG Chest 2 View  Result Date: 12/01/2021 CLINICAL DATA:  Sepsis EXAM: CHEST - 2 VIEW COMPARISON:  Chest x-ray 09/18/2021 FINDINGS: Bilateral central venous catheters visualized with  the tips near the cavoatrial junction. Heart size is normal. Mediastinum appears stable. No focal consolidation, pleural effusion or pneumothorax identified. IMPRESSION: No acute intrathoracic process identified. Electronically Signed   By: Ofilia Neas M.D.   On: 12/01/2021 17:37   CT Head Wo Contrast  Result Date: 12/01/2021 CLINICAL DATA:  Neuro deficit, acute, stroke suspected no evidence of acute EXAM: CT HEAD  WITHOUT CONTRAST TECHNIQUE: Contiguous axial images were obtained from the base of the skull through the vertex without intravenous contrast. RADIATION DOSE REDUCTION: This exam was performed according to the departmental dose-optimization program which includes automated exposure control, adjustment of the mA and/or kV according to patient size and/or use of iterative reconstruction technique. COMPARISON:  None. FINDINGS: Brain: No evidence of large vascular territory infarct or acute hemorrhage.Patchy white matter hypoattenuation, most conspicuous in the left parietal juxtacortical white matter. No mass lesion, midline shift, hydrocephalus, or visible extra-axial fluid collection Vascular: No hyperdense vessel identified. Calcific intracranial atherosclerosis. Skull: No acute fracture. Sinuses/Orbits: Clear visualized sinuses.  Unremarkable orbits. Other: No mastoid effusions. IMPRESSION: 1. No evidence of large vascular territory infarct or acute hemorrhage. 2. Patchy white matter hypoattenuation, most conspicuous in the left parietal juxtacortical white matter. Findings could represent chronic microvascular ischemic disease, but small acute white matter infarct is not excluded in the absence of priors. An MRI could further evaluate if clinically indicated. Electronically Signed   By: Margaretha Sheffield M.D.   On: 12/01/2021 17:55     Subjective: No acute issues or events overnight   Discharge Exam: Vitals:   12/02/21 1313 12/02/21 1315  BP: 120/75 95/62  Pulse: 89 (!) 102  Resp:    Temp:    SpO2:     Vitals:   12/02/21 0345 12/02/21 1312 12/02/21 1313 12/02/21 1315  BP: 133/83 125/79 120/75 95/62  Pulse: 89 85 89 (!) 102  Resp:      Temp: 98.9 F (37.2 C) 98.3 F (36.8 C)    TempSrc: Oral Oral    SpO2: 98% 98%    Weight: 113.4 kg     Height:        General: Pt is alert, awake, not in acute distress Cardiovascular: RRR, S1/S2 +, no rubs, no gallops Respiratory: CTA bilaterally, no  wheezing, no rhonchi Abdominal: Soft, NT, ND, bowel sounds + Extremities: no edema, no cyanosis    The results of significant diagnostics from this hospitalization (including imaging, microbiology, ancillary and laboratory) are listed below for reference.     Microbiology: Recent Results (from the past 240 hour(s))  Culture, blood (Routine x 2)     Status: None (Preliminary result)   Collection Time: 12/01/21  4:50 PM   Specimen: BLOOD  Result Value Ref Range Status   Specimen Description   Final    BLOOD RIGHT ANTECUBITAL Performed at [facility] [address], [place], [place] [address]    Special Requests   Final    BOTTLES DRAWN AEROBIC AND ANAEROBIC Blood Culture adequate volume Performed at [facility] [address] [place], [place] [address]    Culture   Final    NO GROWTH < 24 HOURS Performed at [facility] [address], [place], [place] [address]    Report Status PENDING  Incomplete  Resp Panel by RT-PCR (Flu A&B, Covid)     Status: None   Collection Time: 12/01/21  5:12 PM   Specimen: Nasopharyngeal(NP) swabs in vial transport medium  Result Value Ref Range  Status   SARS Coronavirus 2 by RT PCR NEGATIVE NEGATIVE Final    Comment: (NOTE) SARS-CoV-2 target nucleic acids are NOT DETECTED.  The SARS-CoV-2 RNA is generally detectable in upper respiratory specimens during the acute phase of infection. The lowest concentration of SARS-CoV-2 viral copies this assay can detect is 138 copies/mL. A negative result does not preclude SARS-Cov-2 infection and should not be used as the sole basis for treatment or other patient management decisions. A negative result may occur with  improper specimen collection/handling, submission of specimen other than nasopharyngeal swab, presence of viral mutation(s) within the areas targeted by this assay, and inadequate number of viral copies(<138 copies/mL). A negative result must be  combined with clinical observations, patient history, and epidemiological information. The expected result is Negative.  Fact Sheet for Patients:  EntrepreneurPulse.com.au  Fact Sheet for Healthcare Providers:  IncredibleEmployment.be  This test is no t yet approved or cleared by the [place] FDA and  has been authorized for detection and/or diagnosis of SARS-CoV-2 by FDA under an Emergency Use Authorization (EUA). This EUA will remain  in effect (meaning this test can be used) for the duration of the COVID-19 declaration under Section 564(b)(1) of the Act, 21 U.S.C.section 360bbb-3(b)(1), unless the authorization is terminated  or revoked sooner.       Influenza A by PCR NEGATIVE NEGATIVE Final   Influenza B by PCR NEGATIVE NEGATIVE Final    Comment: (NOTE) The Xpert Xpress SARS-CoV-2/FLU/RSV plus assay is intended as an aid in the diagnosis of influenza from Nasopharyngeal swab specimens and should not be used as a sole basis for treatment. Nasal washings and aspirates are unacceptable for Xpert Xpress SARS-CoV-2/FLU/RSV testing.  Fact Sheet for Patients: EntrepreneurPulse.com.au  Fact Sheet for Healthcare Providers: IncredibleEmployment.be  This test is not yet approved or cleared by the [place] FDA and has been authorized for detection and/or diagnosis of SARS-CoV-2 by FDA under an Emergency Use Authorization (EUA). This EUA will remain in effect (meaning this test can be used) for the duration of the COVID-19 declaration under Section 564(b)(1) of the Act, 21 U.S.C. section 360bbb-3(b)(1), unless the authorization is terminated or revoked.  Performed at [facility], [place] [address] [place], [place] [address]   Culture, blood (Routine x 2)     Status: None (Preliminary result)   Collection Time: 12/02/21  2:10 AM   Specimen: BLOOD  Result Value Ref Range Status    Specimen Description BLOOD LEFT ARM  Final   Special Requests   Final    BOTTLES DRAWN AEROBIC AND ANAEROBIC Blood Culture results may not be optimal due to an inadequate volume of blood received in culture bottles   Culture   Final    NO GROWTH < 12 HOURS Performed at [facility], [place] [address], [place], [place] [address]    Report Status PENDING  Incomplete     Labs: BNP (last 3 results) No results for input(s): BNP in the last 8760 hours. Basic Metabolic Panel: Recent Labs  Lab 12/01/21 1655 12/02/21 0210 12/02/21 1210  NA 133* 134* 134*  K 2.6* 2.7* 2.9*  CL 92* 93* 94*  CO2 22 21* 21*  GLUCOSE 98 90 97  BUN 59* 60* 64*  CREATININE 10.41* 11.03* 11.40*  CALCIUM 8.2* 8.2* 8.2*  MG  --  1.7  --    Liver Function Tests: Recent Labs  Lab 12/01/21 1655 12/02/21 0210  AST 13* 15  ALT 9 10  ALKPHOS  51 52  BILITOT 0.6 0.6  PROT 6.5 6.4*  ALBUMIN 3.8 3.5   No results for input(s): LIPASE, AMYLASE in the last 168 hours. No results for input(s): AMMONIA in the last 168 hours. CBC: Recent Labs  Lab 12/01/21 1655 12/02/21 0210  WBC 8.9 8.2  NEUTROABS 5.6 5.2  HGB 7.8* 8.3*  HCT 22.7* 23.8*  MCV 87.6 87.2  PLT 314 247   Cardiac Enzymes: No results for input(s): CKTOTAL, CKMB, CKMBINDEX, TROPONINI in the last 168 hours. BNP: Invalid input(s): POCBNP CBG: No results for input(s): GLUCAP in the last 168 hours. D-Dimer No results for input(s): DDIMER in the last 72 hours. Hgb A1c No results for input(s): HGBA1C in the last 72 hours. Lipid Profile No results for input(s): CHOL, HDL, LDLCALC, TRIG, CHOLHDL, LDLDIRECT in the last 72 hours. Thyroid function studies No results for input(s): TSH, T4TOTAL, T3FREE, THYROIDAB in the last 72 hours.  Invalid input(s): FREET3 Anemia work up Recent Labs    12/02/21 0210  TIBC 241*  IRON 27*   Urinalysis    Component Value Date/Time   COLORURINE STRAW (A) 12/01/2021 1654   APPEARANCEUR CLEAR 12/01/2021  1654   LABSPEC 1.005 12/01/2021 1654   PHURINE 8.0 12/01/2021 1654   GLUCOSEU 150 (A) 12/01/2021 1654   HGBUR NEGATIVE 12/01/2021 1654   BILIRUBINUR NEGATIVE 12/01/2021 1654   KETONESUR 5 (A) 12/01/2021 1654   PROTEINUR 30 (A) 12/01/2021 1654   NITRITE NEGATIVE 12/01/2021 1654   LEUKOCYTESUR NEGATIVE 12/01/2021 1654   Sepsis Labs Invalid input(s): PROCALCITONIN,  WBC,  LACTICIDVEN Microbiology Recent Results (from the past 240 hour(s))  Culture, blood (Routine x 2)     Status: None (Preliminary result)   Collection Time: 12/01/21  4:50 PM   Specimen: BLOOD  Result Value Ref Range Status   Specimen Description   Final    BLOOD RIGHT ANTECUBITAL Performed at [facility], [place] [address] [place], [place] [address]    Special Requests   Final    BOTTLES DRAWN AEROBIC AND ANAEROBIC Blood Culture adequate volume Performed at [facility] [address] [place], [place] [address]    Culture   Final    NO GROWTH < 24 HOURS Performed at [facility] [address], [place], [place] [address]    Report Status PENDING  Incomplete  Resp Panel by RT-PCR (Flu A&B, Covid)     Status: None   Collection Time: 12/01/21  5:12 PM   Specimen: Nasopharyngeal(NP) swabs in vial transport medium  Result Value Ref Range Status   SARS Coronavirus 2 by RT PCR NEGATIVE NEGATIVE Final    Comment: (NOTE) SARS-CoV-2 target nucleic acids are NOT DETECTED.  The SARS-CoV-2 RNA is generally detectable in upper respiratory specimens during the acute phase of infection. The lowest concentration of SARS-CoV-2 viral copies this assay can detect is 138 copies/mL. A negative result does not preclude SARS-Cov-2 infection and should not be used as the sole basis for treatment or other patient management decisions. A negative result may occur with  improper specimen collection/handling, submission of specimen other than nasopharyngeal swab, presence of viral  mutation(s) within the areas targeted by this assay, and inadequate number of viral copies(<138 copies/mL). A negative result must be combined with clinical observations, patient history, and epidemiological information. The expected result is Negative.  Fact Sheet for Patients:  EntrepreneurPulse.com.au  Fact Sheet for Healthcare Providers:  IncredibleEmployment.be  This test is no t yet approved or cleared by  the [employer] and  has been authorized for detection and/or diagnosis of SARS-CoV-2 by FDA under an Emergency Use Authorization (EUA). This EUA will remain  in effect (meaning this test can be used) for the duration of the COVID-19 declaration under Section 564(b)(1) of the Act, 21 U.S.C.section 360bbb-3(b)(1), unless the authorization is terminated  or revoked sooner.       Influenza A by PCR NEGATIVE NEGATIVE Final   Influenza B by PCR NEGATIVE NEGATIVE Final    Comment: (NOTE) The Xpert Xpress SARS-CoV-2/FLU/RSV plus assay is intended as an aid in the diagnosis of influenza from Nasopharyngeal swab specimens and should not be used as a sole basis for treatment. Nasal washings and aspirates are unacceptable for Xpert Xpress SARS-CoV-2/FLU/RSV testing.  Fact Sheet for Patients: EntrepreneurPulse.com.au  Fact Sheet for Healthcare Providers: IncredibleEmployment.be  This test is not yet approved or cleared by the [place] FDA and has been authorized for detection and/or diagnosis of SARS-CoV-2 by FDA under an Emergency Use Authorization (EUA). This EUA will remain in effect (meaning this test can be used) for the duration of the COVID-19 declaration under Section 564(b)(1) of the Act, 21 U.S.C. section 360bbb-3(b)(1), unless the authorization is terminated or revoked.  Performed at [facility], [place] [address], [place], [place] [address]   Culture, blood (Routine  x 2)     Status: None (Preliminary result)   Collection Time: 12/02/21  2:10 AM   Specimen: BLOOD  Result Value Ref Range Status   Specimen Description BLOOD LEFT ARM  Final   Special Requests   Final    BOTTLES DRAWN AEROBIC AND ANAEROBIC Blood Culture results may not be optimal due to an inadequate volume of blood received in culture bottles   Culture   Final    NO GROWTH < 12 HOURS Performed at [facility], [place] [address], [place], [place] [address]    Report Status PENDING  Incomplete     Time coordinating discharge: Over 30 minutes  SIGNED:   Little Ishikawa, DO [facility] 12/02/2021, 2:39 PM Pager   If 7PM-7AM, please contact night-coverage [email or web address]

## 2021-12-03 DIAGNOSIS — N179 Acute kidney failure, unspecified: Secondary | ICD-10-CM | POA: Diagnosis not present

## 2021-12-04 LAB — CULTURE, BLOOD (ROUTINE X 2): Special Requests: ADEQUATE

## 2021-12-05 DIAGNOSIS — N179 Acute kidney failure, unspecified: Secondary | ICD-10-CM | POA: Diagnosis not present

## 2021-12-06 DIAGNOSIS — G4701 Insomnia due to medical condition: Secondary | ICD-10-CM | POA: Diagnosis not present

## 2021-12-06 DIAGNOSIS — C8449 Peripheral T-cell lymphoma, not classified, extranodal and solid organ sites: Secondary | ICD-10-CM | POA: Diagnosis not present

## 2021-12-06 DIAGNOSIS — C8440 Peripheral T-cell lymphoma, not classified, unspecified site: Secondary | ICD-10-CM | POA: Diagnosis not present

## 2021-12-07 LAB — CULTURE, BLOOD (ROUTINE X 2): Culture: NO GROWTH

## 2021-12-19 DIAGNOSIS — N179 Acute kidney failure, unspecified: Secondary | ICD-10-CM | POA: Diagnosis not present

## 2021-12-20 DIAGNOSIS — C8449 Peripheral T-cell lymphoma, not classified, extranodal and solid organ sites: Secondary | ICD-10-CM | POA: Diagnosis not present

## 2021-12-24 DIAGNOSIS — N179 Acute kidney failure, unspecified: Secondary | ICD-10-CM | POA: Diagnosis not present

## 2021-12-27 DIAGNOSIS — D6481 Anemia due to antineoplastic chemotherapy: Secondary | ICD-10-CM | POA: Diagnosis not present

## 2021-12-27 DIAGNOSIS — T451X5A Adverse effect of antineoplastic and immunosuppressive drugs, initial encounter: Secondary | ICD-10-CM | POA: Diagnosis not present

## 2021-12-27 DIAGNOSIS — R55 Syncope and collapse: Secondary | ICD-10-CM | POA: Diagnosis not present

## 2021-12-27 DIAGNOSIS — G4700 Insomnia, unspecified: Secondary | ICD-10-CM | POA: Diagnosis not present

## 2021-12-27 DIAGNOSIS — Z789 Other specified health status: Secondary | ICD-10-CM | POA: Diagnosis not present

## 2021-12-27 DIAGNOSIS — D6959 Other secondary thrombocytopenia: Secondary | ICD-10-CM | POA: Diagnosis not present

## 2021-12-27 DIAGNOSIS — R630 Anorexia: Secondary | ICD-10-CM | POA: Diagnosis not present

## 2021-12-27 DIAGNOSIS — N179 Acute kidney failure, unspecified: Secondary | ICD-10-CM | POA: Diagnosis not present

## 2021-12-27 DIAGNOSIS — I959 Hypotension, unspecified: Secondary | ICD-10-CM | POA: Diagnosis not present

## 2021-12-27 DIAGNOSIS — C8449 Peripheral T-cell lymphoma, not classified, extranodal and solid organ sites: Secondary | ICD-10-CM | POA: Diagnosis not present

## 2021-12-27 DIAGNOSIS — L281 Prurigo nodularis: Secondary | ICD-10-CM | POA: Diagnosis not present

## 2021-12-30 DIAGNOSIS — C8449 Peripheral T-cell lymphoma, not classified, extranodal and solid organ sites: Secondary | ICD-10-CM | POA: Diagnosis not present

## 2022-01-02 DIAGNOSIS — N179 Acute kidney failure, unspecified: Secondary | ICD-10-CM | POA: Diagnosis not present

## 2022-01-07 DIAGNOSIS — N179 Acute kidney failure, unspecified: Secondary | ICD-10-CM | POA: Diagnosis not present

## 2022-01-13 DIAGNOSIS — C8449 Peripheral T-cell lymphoma, not classified, extranodal and solid organ sites: Secondary | ICD-10-CM | POA: Diagnosis not present

## 2022-01-16 DIAGNOSIS — N179 Acute kidney failure, unspecified: Secondary | ICD-10-CM | POA: Diagnosis not present

## 2022-01-17 DIAGNOSIS — C8449 Peripheral T-cell lymphoma, not classified, extranodal and solid organ sites: Secondary | ICD-10-CM | POA: Diagnosis not present

## 2022-01-23 DIAGNOSIS — N179 Acute kidney failure, unspecified: Secondary | ICD-10-CM | POA: Diagnosis not present

## 2022-01-30 DIAGNOSIS — N179 Acute kidney failure, unspecified: Secondary | ICD-10-CM | POA: Diagnosis not present

## 2022-02-04 DIAGNOSIS — N179 Acute kidney failure, unspecified: Secondary | ICD-10-CM | POA: Diagnosis not present

## 2022-02-07 DIAGNOSIS — C8449 Peripheral T-cell lymphoma, not classified, extranodal and solid organ sites: Secondary | ICD-10-CM | POA: Diagnosis not present

## 2022-02-17 DIAGNOSIS — D709 Neutropenia, unspecified: Secondary | ICD-10-CM | POA: Diagnosis not present

## 2022-02-17 DIAGNOSIS — Z992 Dependence on renal dialysis: Secondary | ICD-10-CM | POA: Diagnosis not present

## 2022-02-17 DIAGNOSIS — N179 Acute kidney failure, unspecified: Secondary | ICD-10-CM | POA: Diagnosis not present

## 2022-02-17 DIAGNOSIS — D630 Anemia in neoplastic disease: Secondary | ICD-10-CM | POA: Diagnosis not present

## 2022-02-17 DIAGNOSIS — G4733 Obstructive sleep apnea (adult) (pediatric): Secondary | ICD-10-CM | POA: Diagnosis not present

## 2022-02-17 DIAGNOSIS — Z9989 Dependence on other enabling machines and devices: Secondary | ICD-10-CM | POA: Diagnosis not present

## 2022-02-17 DIAGNOSIS — Z20822 Contact with and (suspected) exposure to covid-19: Secondary | ICD-10-CM | POA: Diagnosis not present

## 2022-02-17 DIAGNOSIS — K219 Gastro-esophageal reflux disease without esophagitis: Secondary | ICD-10-CM | POA: Diagnosis not present

## 2022-02-17 DIAGNOSIS — I10 Essential (primary) hypertension: Secondary | ICD-10-CM | POA: Diagnosis not present

## 2022-02-17 DIAGNOSIS — G4700 Insomnia, unspecified: Secondary | ICD-10-CM | POA: Diagnosis not present

## 2022-02-17 DIAGNOSIS — R5081 Fever presenting with conditions classified elsewhere: Secondary | ICD-10-CM | POA: Diagnosis not present

## 2022-02-17 DIAGNOSIS — C8449 Peripheral T-cell lymphoma, not classified, extranodal and solid organ sites: Secondary | ICD-10-CM | POA: Diagnosis not present

## 2022-02-17 DIAGNOSIS — D696 Thrombocytopenia, unspecified: Secondary | ICD-10-CM | POA: Diagnosis not present

## 2022-02-17 DIAGNOSIS — N186 End stage renal disease: Secondary | ICD-10-CM | POA: Diagnosis not present

## 2022-02-17 DIAGNOSIS — C8448 Peripheral T-cell lymphoma, not classified, lymph nodes of multiple sites: Secondary | ICD-10-CM | POA: Diagnosis not present

## 2022-02-20 DIAGNOSIS — Z5111 Encounter for antineoplastic chemotherapy: Secondary | ICD-10-CM | POA: Diagnosis not present

## 2022-02-20 DIAGNOSIS — C8449 Peripheral T-cell lymphoma, not classified, extranodal and solid organ sites: Secondary | ICD-10-CM | POA: Diagnosis not present

## 2022-02-24 DIAGNOSIS — R1312 Dysphagia, oropharyngeal phase: Secondary | ICD-10-CM | POA: Diagnosis not present

## 2022-02-24 DIAGNOSIS — G4733 Obstructive sleep apnea (adult) (pediatric): Secondary | ICD-10-CM | POA: Diagnosis not present

## 2022-02-24 DIAGNOSIS — D6481 Anemia due to antineoplastic chemotherapy: Secondary | ICD-10-CM | POA: Diagnosis not present

## 2022-02-24 DIAGNOSIS — R918 Other nonspecific abnormal finding of lung field: Secondary | ICD-10-CM | POA: Diagnosis not present

## 2022-02-24 DIAGNOSIS — Z7952 Long term (current) use of systemic steroids: Secondary | ICD-10-CM | POA: Diagnosis not present

## 2022-02-24 DIAGNOSIS — D6959 Other secondary thrombocytopenia: Secondary | ICD-10-CM | POA: Diagnosis not present

## 2022-02-24 DIAGNOSIS — Z20822 Contact with and (suspected) exposure to covid-19: Secondary | ICD-10-CM | POA: Diagnosis not present

## 2022-02-24 DIAGNOSIS — K5900 Constipation, unspecified: Secondary | ICD-10-CM | POA: Diagnosis not present

## 2022-02-24 DIAGNOSIS — M898X9 Other specified disorders of bone, unspecified site: Secondary | ICD-10-CM | POA: Diagnosis not present

## 2022-02-24 DIAGNOSIS — L281 Prurigo nodularis: Secondary | ICD-10-CM | POA: Diagnosis not present

## 2022-02-24 DIAGNOSIS — R4182 Altered mental status, unspecified: Secondary | ICD-10-CM | POA: Diagnosis not present

## 2022-02-24 DIAGNOSIS — G9349 Other encephalopathy: Secondary | ICD-10-CM | POA: Diagnosis not present

## 2022-02-24 DIAGNOSIS — Z515 Encounter for palliative care: Secondary | ICD-10-CM | POA: Diagnosis not present

## 2022-02-24 DIAGNOSIS — R6883 Chills (without fever): Secondary | ICD-10-CM | POA: Diagnosis not present

## 2022-02-24 DIAGNOSIS — G4700 Insomnia, unspecified: Secondary | ICD-10-CM | POA: Diagnosis not present

## 2022-02-24 DIAGNOSIS — T451X5A Adverse effect of antineoplastic and immunosuppressive drugs, initial encounter: Secondary | ICD-10-CM | POA: Diagnosis not present

## 2022-02-24 DIAGNOSIS — Z79899 Other long term (current) drug therapy: Secondary | ICD-10-CM | POA: Diagnosis not present

## 2022-02-24 DIAGNOSIS — D849 Immunodeficiency, unspecified: Secondary | ICD-10-CM | POA: Diagnosis not present

## 2022-02-24 DIAGNOSIS — N186 End stage renal disease: Secondary | ICD-10-CM | POA: Diagnosis not present

## 2022-02-24 DIAGNOSIS — Z7401 Bed confinement status: Secondary | ICD-10-CM | POA: Diagnosis not present

## 2022-02-24 DIAGNOSIS — R9089 Other abnormal findings on diagnostic imaging of central nervous system: Secondary | ICD-10-CM | POA: Diagnosis not present

## 2022-02-24 DIAGNOSIS — Z992 Dependence on renal dialysis: Secondary | ICD-10-CM | POA: Diagnosis not present

## 2022-02-24 DIAGNOSIS — I120 Hypertensive chronic kidney disease with stage 5 chronic kidney disease or end stage renal disease: Secondary | ICD-10-CM | POA: Diagnosis not present

## 2022-02-24 DIAGNOSIS — Z66 Do not resuscitate: Secondary | ICD-10-CM | POA: Diagnosis not present

## 2022-02-24 DIAGNOSIS — C8449 Peripheral T-cell lymphoma, not classified, extranodal and solid organ sites: Secondary | ICD-10-CM | POA: Diagnosis not present

## 2022-02-24 DIAGNOSIS — G131 Other systemic atrophy primarily affecting central nervous system in neoplastic disease: Secondary | ICD-10-CM | POA: Diagnosis not present

## 2022-02-24 DIAGNOSIS — E43 Unspecified severe protein-calorie malnutrition: Secondary | ICD-10-CM | POA: Diagnosis not present

## 2022-02-24 DIAGNOSIS — R569 Unspecified convulsions: Secondary | ICD-10-CM | POA: Diagnosis not present

## 2022-02-24 DIAGNOSIS — R110 Nausea: Secondary | ICD-10-CM | POA: Diagnosis not present

## 2022-02-25 DIAGNOSIS — N186 End stage renal disease: Secondary | ICD-10-CM | POA: Diagnosis not present

## 2022-02-25 DIAGNOSIS — Z9221 Personal history of antineoplastic chemotherapy: Secondary | ICD-10-CM | POA: Diagnosis not present

## 2022-02-25 DIAGNOSIS — R1312 Dysphagia, oropharyngeal phase: Secondary | ICD-10-CM | POA: Diagnosis not present

## 2022-02-25 DIAGNOSIS — C8449 Peripheral T-cell lymphoma, not classified, extranodal and solid organ sites: Secondary | ICD-10-CM | POA: Diagnosis not present

## 2022-02-25 DIAGNOSIS — R29898 Other symptoms and signs involving the musculoskeletal system: Secondary | ICD-10-CM | POA: Diagnosis not present

## 2022-02-25 DIAGNOSIS — D729 Disorder of white blood cells, unspecified: Secondary | ICD-10-CM | POA: Diagnosis not present

## 2022-02-25 DIAGNOSIS — I10 Essential (primary) hypertension: Secondary | ICD-10-CM | POA: Diagnosis not present

## 2022-02-25 DIAGNOSIS — Z992 Dependence on renal dialysis: Secondary | ICD-10-CM | POA: Diagnosis not present

## 2022-02-25 DIAGNOSIS — R4182 Altered mental status, unspecified: Secondary | ICD-10-CM | POA: Diagnosis not present

## 2022-02-26 DIAGNOSIS — N186 End stage renal disease: Secondary | ICD-10-CM | POA: Diagnosis not present

## 2022-02-26 DIAGNOSIS — C8449 Peripheral T-cell lymphoma, not classified, extranodal and solid organ sites: Secondary | ICD-10-CM | POA: Diagnosis not present

## 2022-02-26 DIAGNOSIS — R836 Abnormal cytological findings in cerebrospinal fluid: Secondary | ICD-10-CM | POA: Diagnosis not present

## 2022-02-26 DIAGNOSIS — R1312 Dysphagia, oropharyngeal phase: Secondary | ICD-10-CM | POA: Diagnosis not present

## 2022-02-26 DIAGNOSIS — Z9221 Personal history of antineoplastic chemotherapy: Secondary | ICD-10-CM | POA: Diagnosis not present

## 2022-02-26 DIAGNOSIS — D729 Disorder of white blood cells, unspecified: Secondary | ICD-10-CM | POA: Diagnosis not present

## 2022-02-26 DIAGNOSIS — R29898 Other symptoms and signs involving the musculoskeletal system: Secondary | ICD-10-CM | POA: Diagnosis not present

## 2022-02-26 DIAGNOSIS — R4182 Altered mental status, unspecified: Secondary | ICD-10-CM | POA: Diagnosis not present

## 2022-02-26 DIAGNOSIS — Z992 Dependence on renal dialysis: Secondary | ICD-10-CM | POA: Diagnosis not present

## 2022-02-27 DIAGNOSIS — D729 Disorder of white blood cells, unspecified: Secondary | ICD-10-CM | POA: Diagnosis not present

## 2022-02-27 DIAGNOSIS — C8449 Peripheral T-cell lymphoma, not classified, extranodal and solid organ sites: Secondary | ICD-10-CM | POA: Diagnosis not present

## 2022-02-27 DIAGNOSIS — R1312 Dysphagia, oropharyngeal phase: Secondary | ICD-10-CM | POA: Diagnosis not present

## 2022-02-27 DIAGNOSIS — R29898 Other symptoms and signs involving the musculoskeletal system: Secondary | ICD-10-CM | POA: Diagnosis not present

## 2022-02-27 DIAGNOSIS — R4182 Altered mental status, unspecified: Secondary | ICD-10-CM | POA: Diagnosis not present

## 2022-02-27 DIAGNOSIS — Z992 Dependence on renal dialysis: Secondary | ICD-10-CM | POA: Diagnosis not present

## 2022-02-27 DIAGNOSIS — Z9221 Personal history of antineoplastic chemotherapy: Secondary | ICD-10-CM | POA: Diagnosis not present

## 2022-02-27 DIAGNOSIS — N186 End stage renal disease: Secondary | ICD-10-CM | POA: Diagnosis not present

## 2022-02-27 DIAGNOSIS — I10 Essential (primary) hypertension: Secondary | ICD-10-CM | POA: Diagnosis not present

## 2022-02-27 DIAGNOSIS — G9341 Metabolic encephalopathy: Secondary | ICD-10-CM | POA: Diagnosis not present

## 2022-02-28 DIAGNOSIS — Z9221 Personal history of antineoplastic chemotherapy: Secondary | ICD-10-CM | POA: Diagnosis not present

## 2022-02-28 DIAGNOSIS — R29898 Other symptoms and signs involving the musculoskeletal system: Secondary | ICD-10-CM | POA: Diagnosis not present

## 2022-02-28 DIAGNOSIS — N186 End stage renal disease: Secondary | ICD-10-CM | POA: Diagnosis not present

## 2022-02-28 DIAGNOSIS — C8449 Peripheral T-cell lymphoma, not classified, extranodal and solid organ sites: Secondary | ICD-10-CM | POA: Diagnosis not present

## 2022-02-28 DIAGNOSIS — R4182 Altered mental status, unspecified: Secondary | ICD-10-CM | POA: Diagnosis not present

## 2022-02-28 DIAGNOSIS — Z992 Dependence on renal dialysis: Secondary | ICD-10-CM | POA: Diagnosis not present

## 2022-02-28 DIAGNOSIS — D729 Disorder of white blood cells, unspecified: Secondary | ICD-10-CM | POA: Diagnosis not present

## 2022-02-28 DIAGNOSIS — R1312 Dysphagia, oropharyngeal phase: Secondary | ICD-10-CM | POA: Diagnosis not present

## 2022-03-01 DIAGNOSIS — C8449 Peripheral T-cell lymphoma, not classified, extranodal and solid organ sites: Secondary | ICD-10-CM | POA: Diagnosis not present

## 2022-03-01 DIAGNOSIS — I10 Essential (primary) hypertension: Secondary | ICD-10-CM | POA: Diagnosis not present

## 2022-03-01 DIAGNOSIS — C7949 Secondary malignant neoplasm of other parts of nervous system: Secondary | ICD-10-CM | POA: Diagnosis not present

## 2022-03-01 DIAGNOSIS — Z992 Dependence on renal dialysis: Secondary | ICD-10-CM | POA: Diagnosis not present

## 2022-03-01 DIAGNOSIS — R4182 Altered mental status, unspecified: Secondary | ICD-10-CM | POA: Diagnosis not present

## 2022-03-01 DIAGNOSIS — R1312 Dysphagia, oropharyngeal phase: Secondary | ICD-10-CM | POA: Diagnosis not present

## 2022-03-01 DIAGNOSIS — N186 End stage renal disease: Secondary | ICD-10-CM | POA: Diagnosis not present

## 2022-03-02 DIAGNOSIS — R1312 Dysphagia, oropharyngeal phase: Secondary | ICD-10-CM | POA: Diagnosis not present

## 2022-03-02 DIAGNOSIS — R4182 Altered mental status, unspecified: Secondary | ICD-10-CM | POA: Diagnosis not present

## 2022-03-02 DIAGNOSIS — Z992 Dependence on renal dialysis: Secondary | ICD-10-CM | POA: Diagnosis not present

## 2022-03-02 DIAGNOSIS — C8449 Peripheral T-cell lymphoma, not classified, extranodal and solid organ sites: Secondary | ICD-10-CM | POA: Diagnosis not present

## 2022-03-02 DIAGNOSIS — C7949 Secondary malignant neoplasm of other parts of nervous system: Secondary | ICD-10-CM | POA: Diagnosis not present

## 2022-03-02 DIAGNOSIS — N186 End stage renal disease: Secondary | ICD-10-CM | POA: Diagnosis not present

## 2022-03-03 DIAGNOSIS — C8449 Peripheral T-cell lymphoma, not classified, extranodal and solid organ sites: Secondary | ICD-10-CM | POA: Diagnosis not present

## 2022-03-03 DIAGNOSIS — R1312 Dysphagia, oropharyngeal phase: Secondary | ICD-10-CM | POA: Diagnosis not present

## 2022-03-03 DIAGNOSIS — C7949 Secondary malignant neoplasm of other parts of nervous system: Secondary | ICD-10-CM | POA: Diagnosis not present

## 2022-03-03 DIAGNOSIS — Z992 Dependence on renal dialysis: Secondary | ICD-10-CM | POA: Diagnosis not present

## 2022-03-03 DIAGNOSIS — R4182 Altered mental status, unspecified: Secondary | ICD-10-CM | POA: Diagnosis not present

## 2022-03-03 DIAGNOSIS — N186 End stage renal disease: Secondary | ICD-10-CM | POA: Diagnosis not present

## 2022-03-04 DIAGNOSIS — N186 End stage renal disease: Secondary | ICD-10-CM | POA: Diagnosis not present

## 2022-03-04 DIAGNOSIS — C8449 Peripheral T-cell lymphoma, not classified, extranodal and solid organ sites: Secondary | ICD-10-CM | POA: Diagnosis not present

## 2022-03-04 DIAGNOSIS — R1312 Dysphagia, oropharyngeal phase: Secondary | ICD-10-CM | POA: Diagnosis not present

## 2022-03-04 DIAGNOSIS — C7949 Secondary malignant neoplasm of other parts of nervous system: Secondary | ICD-10-CM | POA: Diagnosis not present

## 2022-03-04 DIAGNOSIS — R4182 Altered mental status, unspecified: Secondary | ICD-10-CM | POA: Diagnosis not present

## 2022-03-04 DIAGNOSIS — Z992 Dependence on renal dialysis: Secondary | ICD-10-CM | POA: Diagnosis not present

## 2022-03-05 DIAGNOSIS — N186 End stage renal disease: Secondary | ICD-10-CM | POA: Diagnosis not present

## 2022-03-05 DIAGNOSIS — C7949 Secondary malignant neoplasm of other parts of nervous system: Secondary | ICD-10-CM | POA: Diagnosis not present

## 2022-03-05 DIAGNOSIS — Z992 Dependence on renal dialysis: Secondary | ICD-10-CM | POA: Diagnosis not present

## 2022-03-05 DIAGNOSIS — R1312 Dysphagia, oropharyngeal phase: Secondary | ICD-10-CM | POA: Diagnosis not present

## 2022-03-05 DIAGNOSIS — C8449 Peripheral T-cell lymphoma, not classified, extranodal and solid organ sites: Secondary | ICD-10-CM | POA: Diagnosis not present

## 2022-03-05 DIAGNOSIS — R4182 Altered mental status, unspecified: Secondary | ICD-10-CM | POA: Diagnosis not present

## 2022-03-06 DIAGNOSIS — C8449 Peripheral T-cell lymphoma, not classified, extranodal and solid organ sites: Secondary | ICD-10-CM | POA: Diagnosis not present

## 2022-03-06 DIAGNOSIS — R1312 Dysphagia, oropharyngeal phase: Secondary | ICD-10-CM | POA: Diagnosis not present

## 2022-03-06 DIAGNOSIS — N186 End stage renal disease: Secondary | ICD-10-CM | POA: Diagnosis not present

## 2022-03-06 DIAGNOSIS — Z992 Dependence on renal dialysis: Secondary | ICD-10-CM | POA: Diagnosis not present

## 2022-03-06 DIAGNOSIS — R4182 Altered mental status, unspecified: Secondary | ICD-10-CM | POA: Diagnosis not present

## 2022-03-06 DIAGNOSIS — C7949 Secondary malignant neoplasm of other parts of nervous system: Secondary | ICD-10-CM | POA: Diagnosis not present

## 2022-03-07 DIAGNOSIS — C7949 Secondary malignant neoplasm of other parts of nervous system: Secondary | ICD-10-CM | POA: Diagnosis not present

## 2022-03-07 DIAGNOSIS — Z992 Dependence on renal dialysis: Secondary | ICD-10-CM | POA: Diagnosis not present

## 2022-03-07 DIAGNOSIS — N186 End stage renal disease: Secondary | ICD-10-CM | POA: Diagnosis not present

## 2022-03-07 DIAGNOSIS — C8589 Other specified types of non-Hodgkin lymphoma, extranodal and solid organ sites: Secondary | ICD-10-CM | POA: Diagnosis not present

## 2022-03-07 DIAGNOSIS — C8449 Peripheral T-cell lymphoma, not classified, extranodal and solid organ sites: Secondary | ICD-10-CM | POA: Diagnosis not present

## 2022-03-07 DIAGNOSIS — R1312 Dysphagia, oropharyngeal phase: Secondary | ICD-10-CM | POA: Diagnosis not present

## 2022-03-07 DIAGNOSIS — R569 Unspecified convulsions: Secondary | ICD-10-CM | POA: Diagnosis not present

## 2022-03-08 DIAGNOSIS — R569 Unspecified convulsions: Secondary | ICD-10-CM | POA: Diagnosis not present

## 2022-03-08 DIAGNOSIS — R4182 Altered mental status, unspecified: Secondary | ICD-10-CM | POA: Diagnosis not present

## 2022-03-08 DIAGNOSIS — C8589 Other specified types of non-Hodgkin lymphoma, extranodal and solid organ sites: Secondary | ICD-10-CM | POA: Diagnosis not present

## 2022-03-08 DIAGNOSIS — C8449 Peripheral T-cell lymphoma, not classified, extranodal and solid organ sites: Secondary | ICD-10-CM | POA: Diagnosis not present

## 2022-03-08 DIAGNOSIS — N186 End stage renal disease: Secondary | ICD-10-CM | POA: Diagnosis not present

## 2022-03-08 DIAGNOSIS — C7949 Secondary malignant neoplasm of other parts of nervous system: Secondary | ICD-10-CM | POA: Diagnosis not present

## 2022-03-08 DIAGNOSIS — R1312 Dysphagia, oropharyngeal phase: Secondary | ICD-10-CM | POA: Diagnosis not present

## 2022-03-08 DIAGNOSIS — Z992 Dependence on renal dialysis: Secondary | ICD-10-CM | POA: Diagnosis not present

## 2022-03-09 DIAGNOSIS — R4182 Altered mental status, unspecified: Secondary | ICD-10-CM | POA: Diagnosis not present

## 2022-03-09 DIAGNOSIS — C7949 Secondary malignant neoplasm of other parts of nervous system: Secondary | ICD-10-CM | POA: Diagnosis not present

## 2022-03-09 DIAGNOSIS — C8589 Other specified types of non-Hodgkin lymphoma, extranodal and solid organ sites: Secondary | ICD-10-CM | POA: Diagnosis not present

## 2022-03-09 DIAGNOSIS — C8449 Peripheral T-cell lymphoma, not classified, extranodal and solid organ sites: Secondary | ICD-10-CM | POA: Diagnosis not present

## 2022-03-09 DIAGNOSIS — I10 Essential (primary) hypertension: Secondary | ICD-10-CM | POA: Diagnosis not present

## 2022-03-09 DIAGNOSIS — Z992 Dependence on renal dialysis: Secondary | ICD-10-CM | POA: Diagnosis not present

## 2022-03-09 DIAGNOSIS — N186 End stage renal disease: Secondary | ICD-10-CM | POA: Diagnosis not present

## 2022-03-09 DIAGNOSIS — R1312 Dysphagia, oropharyngeal phase: Secondary | ICD-10-CM | POA: Diagnosis not present

## 2022-03-10 DIAGNOSIS — L299 Pruritus, unspecified: Secondary | ICD-10-CM | POA: Diagnosis not present

## 2022-03-10 DIAGNOSIS — R110 Nausea: Secondary | ICD-10-CM | POA: Diagnosis not present

## 2022-03-10 DIAGNOSIS — C8589 Other specified types of non-Hodgkin lymphoma, extranodal and solid organ sites: Secondary | ICD-10-CM | POA: Diagnosis not present

## 2022-03-10 DIAGNOSIS — R569 Unspecified convulsions: Secondary | ICD-10-CM | POA: Diagnosis not present

## 2022-03-10 DIAGNOSIS — R4182 Altered mental status, unspecified: Secondary | ICD-10-CM | POA: Diagnosis not present

## 2022-04-12 DEATH — deceased

## 2023-02-11 IMAGING — MR MR SHOULDER*R* W/O CM
4 of 5 series · 25 of 40 positions shown · non-contrast
Comparison: X-ray 12/25/2020

CLINICAL DATA: Chronic right shoulder pain

EXAM:
MRI OF THE RIGHT SHOULDER WITHOUT CONTRAST
TECHNIQUE: Multiplanar, multisequence MR imaging of the shoulder was performed.
No intravenous contrast was administered.

[Series 6: T2 fat-sat · axial · right · 3.0mm · 0.47mm/px · z∈[-62,+40]mm · 11 of 27 slices shown (1 of 3)]
[im 1/27]
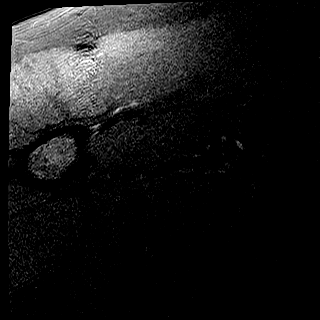
[im 3/27]
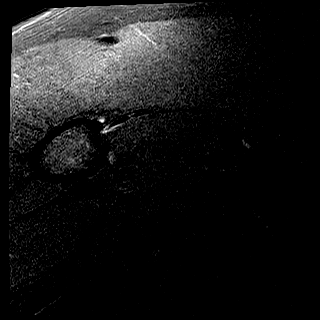
[im 6/27]
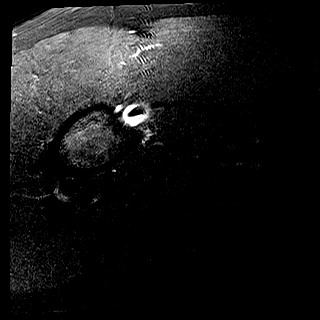
[im 8/27]
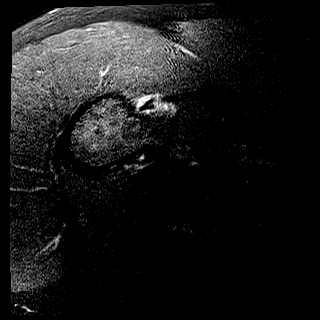
[im 11/27]
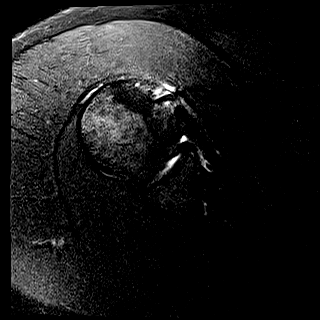
[im 14/27]
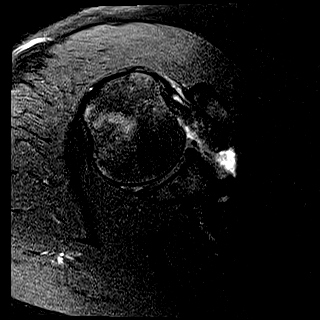
[im 16/27]
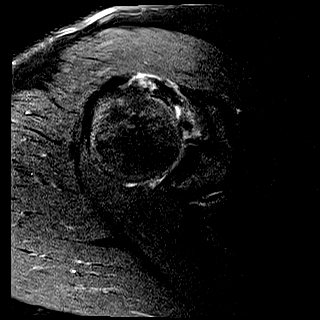
[im 19/27]
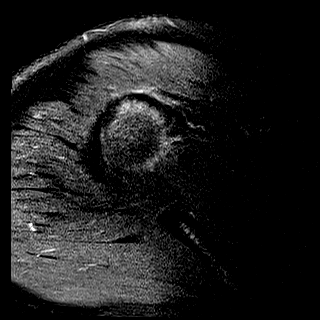
[im 21/27]
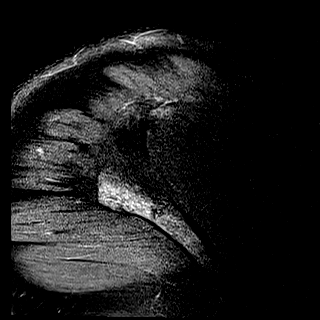
[im 24/27]
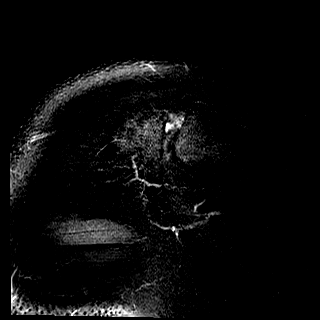
[im 27/27]
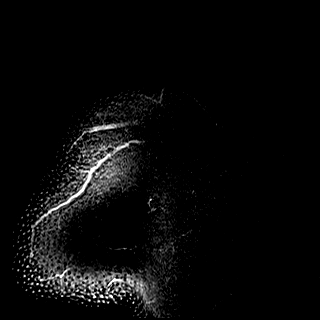

[Series 7: T2 fat-sat · oblique · right · 4.0mm · 0.22mm/px · 4 of 19 slices shown (2 of 3)]
[im 1/19]
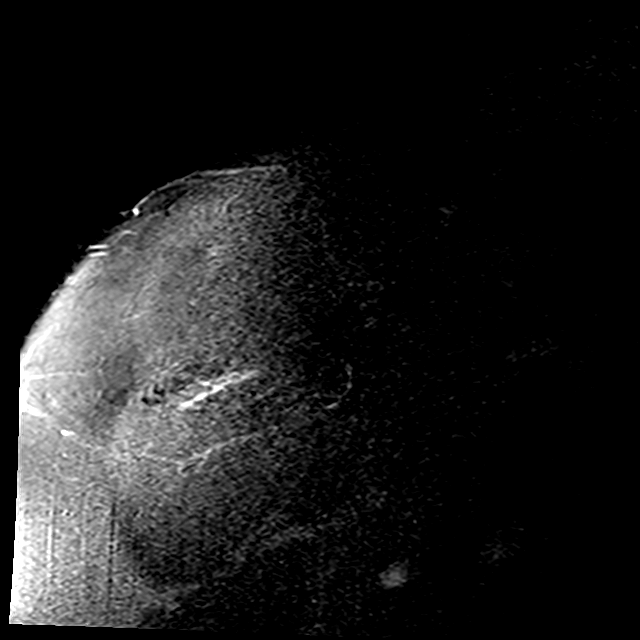
[im 3/19]
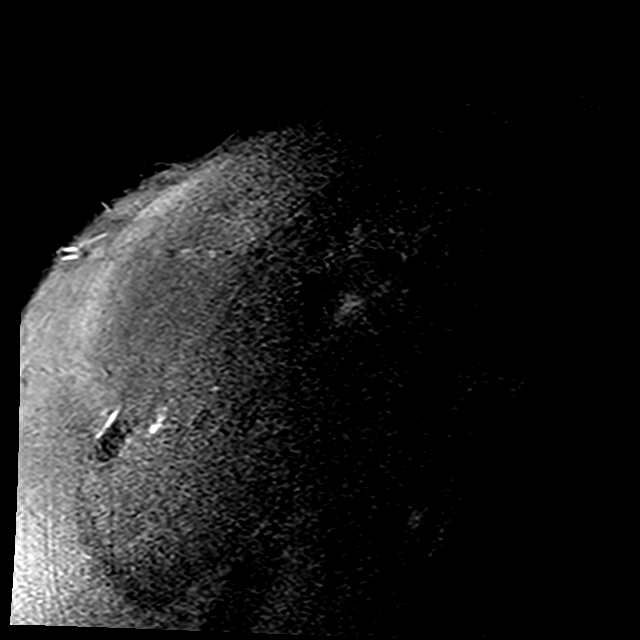
[im 11/19]
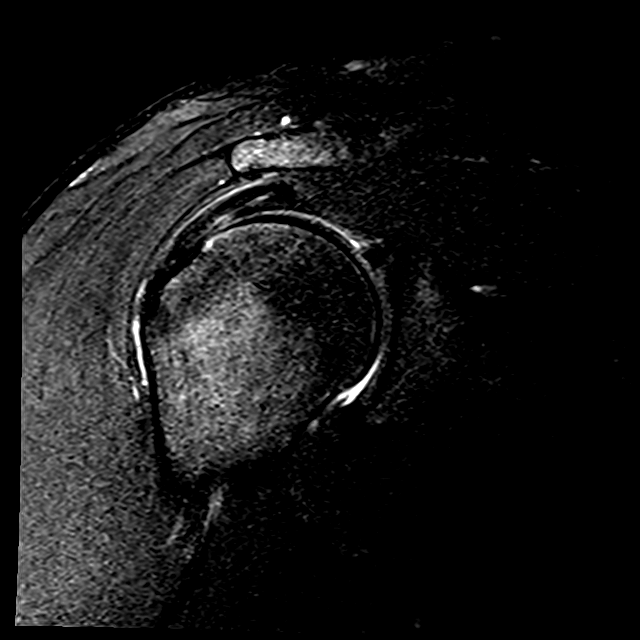
[im 16/19]
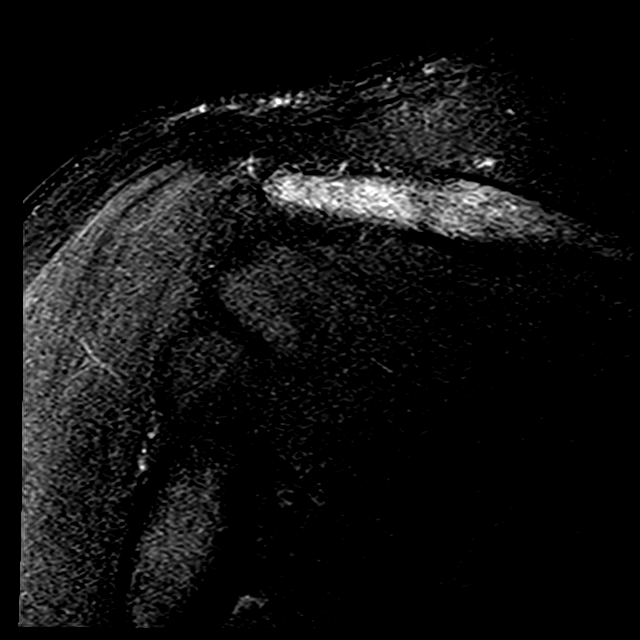

[Series 8: PD · oblique · right · 4.0mm · 0.27mm/px · 7 of 19 slices shown]
[im 1/19]
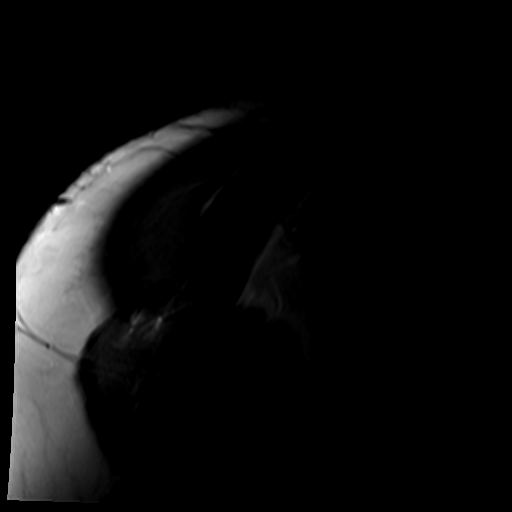
[im 4/19]
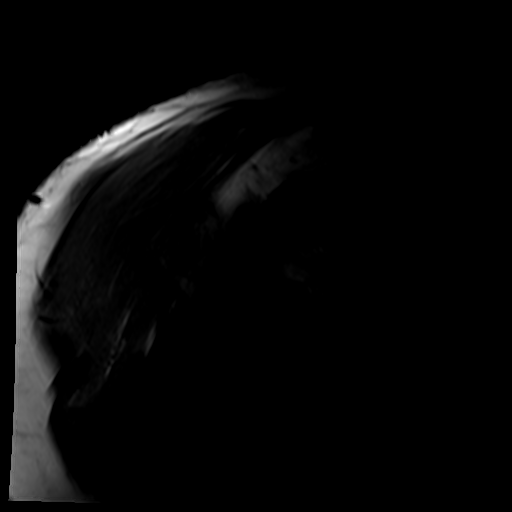
[im 7/19]
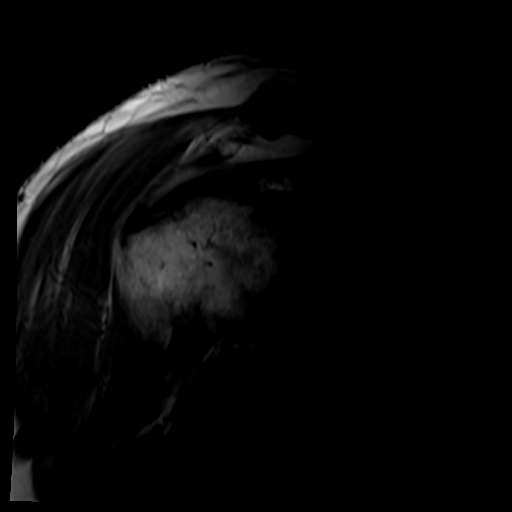
[im 10/19]
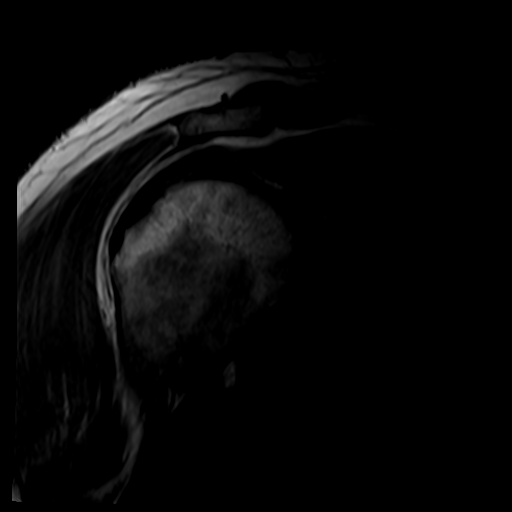
[im 13/19]
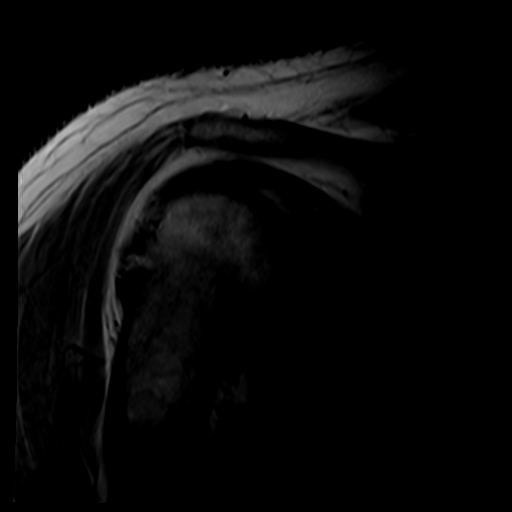
[im 16/19]
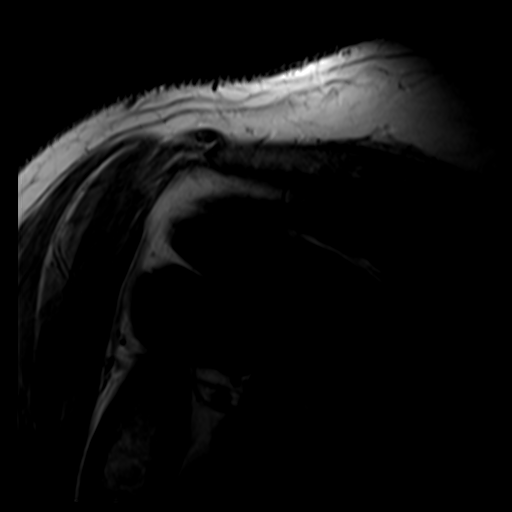
[im 19/19]
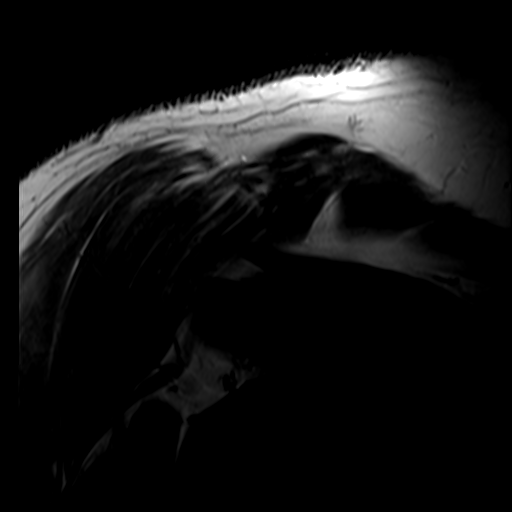

[Series 9: T2 fat-sat · sagittal · right · 4.0mm · 0.55mm/px · 3 of 19 slices shown (3 of 3)]
[im 4/19]
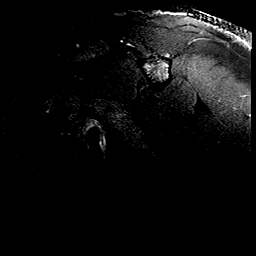
[im 10/19]
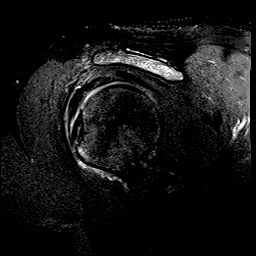
[im 16/19]
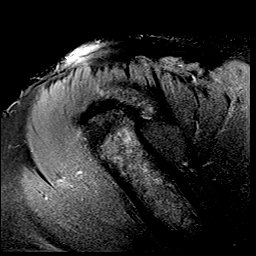

[25 of 40 positions shown; findings below may reference images not displayed]

FINDINGS: Rotator cuff: Partial thickness articular surface avulsion of the
anterior supraspinatus tendon insertion measuring approximately 10
mm in AP dimension (series 9, image 14). Subscapularis tendinosis
with partial-thickness interstitial tear distally. Infraspinatus and
teres minor tendons intact. No full thickness rotator cuff tear.

Muscles: Preserved bulk and signal intensity of the rotator cuff
musculature without edema, atrophy, or fatty infiltration.

Biceps long head: Intact. Biceps tendon is medially perched at the
groove entry zone without dislocation.

Acromioclavicular Joint: Minimal arthropathy of the AC joint. No
subacromial-subdeltoid bursal fluid.

Glenohumeral Joint: No joint effusion. No chondral defect.

Labrum: Grossly intact, but evaluation is limited by lack of
intraarticular fluid. No paralabral cyst.

Bones: Bone marrow edema throughout the acromion a subtle
nondisplaced low T1/T2 signal intensity linear component through the
acromial base (series 6, images 6-7) compatible with developing
fracture. Elsewhere, bony structures are intact. No additional
fractures. No dislocation. No suspicious bone lesion.

Other: None.
IMPRESSION: 1. Findings suggestive of developing nondisplaced fracture of the
acromial base, likely stress fracture.
2. Partial thickness rim rent tear of the anterior supraspinatus
tendon insertion.
3. Subscapularis tendinosis with partial-thickness interstitial tear
distally.
4. Medial subluxation of the long head of the biceps tendon at the
groove entry zone without dislocation.

## 2023-10-01 LAB — MOLECULAR PATHOLOGY
